# Patient Record
Sex: Female | Born: 1940 | Race: White | Hispanic: No | Smoking: Former smoker
Health system: Southern US, Community
[De-identification: ages and names within clinical notes are randomized; demographics above are authoritative.]

## PROBLEM LIST (undated history)

## (undated) DIAGNOSIS — R0602 Shortness of breath: Secondary | ICD-10-CM

## (undated) DIAGNOSIS — K219 Gastro-esophageal reflux disease without esophagitis: Secondary | ICD-10-CM

## (undated) DIAGNOSIS — F419 Anxiety disorder, unspecified: Secondary | ICD-10-CM

## (undated) DIAGNOSIS — R112 Nausea with vomiting, unspecified: Secondary | ICD-10-CM

## (undated) DIAGNOSIS — I509 Heart failure, unspecified: Secondary | ICD-10-CM

## (undated) DIAGNOSIS — I10 Essential (primary) hypertension: Secondary | ICD-10-CM

## (undated) DIAGNOSIS — J189 Pneumonia, unspecified organism: Secondary | ICD-10-CM

## (undated) DIAGNOSIS — R51 Headache: Secondary | ICD-10-CM

## (undated) DIAGNOSIS — F32A Depression, unspecified: Secondary | ICD-10-CM

## (undated) DIAGNOSIS — M1990 Unspecified osteoarthritis, unspecified site: Secondary | ICD-10-CM

## (undated) DIAGNOSIS — M75101 Unspecified rotator cuff tear or rupture of right shoulder, not specified as traumatic: Secondary | ICD-10-CM

## (undated) DIAGNOSIS — M12811 Other specific arthropathies, not elsewhere classified, right shoulder: Secondary | ICD-10-CM

## (undated) DIAGNOSIS — Z9889 Other specified postprocedural states: Secondary | ICD-10-CM

## (undated) DIAGNOSIS — E785 Hyperlipidemia, unspecified: Secondary | ICD-10-CM

## (undated) DIAGNOSIS — E663 Overweight: Secondary | ICD-10-CM

## (undated) DIAGNOSIS — F329 Major depressive disorder, single episode, unspecified: Secondary | ICD-10-CM

## (undated) HISTORY — DX: Depression, unspecified: F32.A

## (undated) HISTORY — PX: TONSILLECTOMY: SUR1361

## (undated) HISTORY — PX: BELPHAROPTOSIS REPAIR: SHX369

## (undated) HISTORY — PX: COLONOSCOPY: SHX174

## (undated) HISTORY — DX: Essential (primary) hypertension: I10

## (undated) HISTORY — DX: Gastro-esophageal reflux disease without esophagitis: K21.9

## (undated) HISTORY — PX: EYE SURGERY: SHX253

## (undated) HISTORY — PX: APPENDECTOMY: SHX54

## (undated) HISTORY — PX: BREAST BIOPSY: SHX20

## (undated) HISTORY — DX: Heart failure, unspecified: I50.9

## (undated) HISTORY — DX: Overweight: E66.3

## (undated) HISTORY — DX: Hyperlipidemia, unspecified: E78.5

## (undated) HISTORY — DX: Major depressive disorder, single episode, unspecified: F32.9

## (undated) HISTORY — PX: CHOLECYSTECTOMY: SHX55

---

## 1996-12-23 HISTORY — PX: VAGINAL HYSTERECTOMY: SUR661

## 1998-12-03 ENCOUNTER — Encounter: Payer: Self-pay | Admitting: Emergency Medicine

## 1998-12-03 ENCOUNTER — Emergency Department (HOSPITAL_COMMUNITY): Admission: EM | Admit: 1998-12-03 | Discharge: 1998-12-03 | Payer: Self-pay | Admitting: Emergency Medicine

## 1999-05-22 ENCOUNTER — Other Ambulatory Visit: Admission: RE | Admit: 1999-05-22 | Discharge: 1999-05-22 | Payer: Self-pay

## 1999-11-28 ENCOUNTER — Encounter: Payer: Self-pay | Admitting: Obstetrics and Gynecology

## 1999-12-03 ENCOUNTER — Inpatient Hospital Stay (HOSPITAL_COMMUNITY): Admission: RE | Admit: 1999-12-03 | Discharge: 1999-12-06 | Payer: Self-pay | Admitting: Obstetrics and Gynecology

## 1999-12-03 ENCOUNTER — Encounter (INDEPENDENT_AMBULATORY_CARE_PROVIDER_SITE_OTHER): Payer: Self-pay

## 2001-04-22 ENCOUNTER — Other Ambulatory Visit: Admission: RE | Admit: 2001-04-22 | Discharge: 2001-04-22 | Payer: Self-pay | Admitting: Obstetrics and Gynecology

## 2001-10-22 ENCOUNTER — Encounter: Payer: Self-pay | Admitting: Obstetrics and Gynecology

## 2001-10-22 ENCOUNTER — Ambulatory Visit (HOSPITAL_COMMUNITY): Admission: RE | Admit: 2001-10-22 | Discharge: 2001-10-22 | Payer: Self-pay | Admitting: Obstetrics and Gynecology

## 2001-12-23 HISTORY — PX: ANTERIOR AND POSTERIOR REPAIR: SHX1172

## 2002-01-28 ENCOUNTER — Ambulatory Visit (HOSPITAL_COMMUNITY): Admission: RE | Admit: 2002-01-28 | Discharge: 2002-01-28 | Payer: Self-pay | Admitting: Orthopaedic Surgery

## 2002-01-28 ENCOUNTER — Encounter: Payer: Self-pay | Admitting: Orthopaedic Surgery

## 2002-07-08 ENCOUNTER — Other Ambulatory Visit: Admission: RE | Admit: 2002-07-08 | Discharge: 2002-07-08 | Payer: Self-pay | Admitting: *Deleted

## 2002-10-22 ENCOUNTER — Ambulatory Visit (HOSPITAL_COMMUNITY): Admission: RE | Admit: 2002-10-22 | Discharge: 2002-10-22 | Payer: Self-pay | Admitting: *Deleted

## 2003-05-20 ENCOUNTER — Ambulatory Visit (HOSPITAL_COMMUNITY): Admission: RE | Admit: 2003-05-20 | Discharge: 2003-05-20 | Payer: Self-pay | Admitting: Gastroenterology

## 2003-12-14 ENCOUNTER — Encounter: Admission: RE | Admit: 2003-12-14 | Discharge: 2004-03-13 | Payer: Self-pay | Admitting: Family Medicine

## 2004-03-14 ENCOUNTER — Encounter: Admission: RE | Admit: 2004-03-14 | Discharge: 2004-06-12 | Payer: Self-pay | Admitting: Family Medicine

## 2004-03-20 ENCOUNTER — Ambulatory Visit (HOSPITAL_COMMUNITY): Admission: RE | Admit: 2004-03-20 | Discharge: 2004-03-20 | Payer: Self-pay | Admitting: *Deleted

## 2004-11-23 ENCOUNTER — Ambulatory Visit (HOSPITAL_COMMUNITY): Admission: RE | Admit: 2004-11-23 | Discharge: 2004-11-23 | Payer: Self-pay | Admitting: *Deleted

## 2004-11-23 ENCOUNTER — Encounter: Payer: Self-pay | Admitting: Family Medicine

## 2004-12-21 ENCOUNTER — Ambulatory Visit (HOSPITAL_COMMUNITY): Admission: RE | Admit: 2004-12-21 | Discharge: 2004-12-21 | Payer: Self-pay | Admitting: Orthopaedic Surgery

## 2005-03-13 ENCOUNTER — Ambulatory Visit (HOSPITAL_COMMUNITY): Admission: RE | Admit: 2005-03-13 | Discharge: 2005-03-13 | Payer: Self-pay | Admitting: *Deleted

## 2005-03-20 ENCOUNTER — Encounter: Admission: RE | Admit: 2005-03-20 | Discharge: 2005-03-20 | Payer: Self-pay | Admitting: *Deleted

## 2006-01-30 ENCOUNTER — Ambulatory Visit: Payer: Self-pay | Admitting: *Deleted

## 2006-01-30 ENCOUNTER — Ambulatory Visit (HOSPITAL_COMMUNITY): Admission: RE | Admit: 2006-01-30 | Discharge: 2006-01-30 | Payer: Self-pay | Admitting: *Deleted

## 2006-02-03 ENCOUNTER — Ambulatory Visit: Payer: Self-pay

## 2006-02-10 ENCOUNTER — Ambulatory Visit: Payer: Self-pay | Admitting: *Deleted

## 2006-02-12 ENCOUNTER — Ambulatory Visit (HOSPITAL_COMMUNITY): Admission: RE | Admit: 2006-02-12 | Discharge: 2006-02-12 | Payer: Self-pay | Admitting: *Deleted

## 2006-02-19 ENCOUNTER — Ambulatory Visit: Payer: Self-pay | Admitting: *Deleted

## 2006-02-19 ENCOUNTER — Inpatient Hospital Stay (HOSPITAL_BASED_OUTPATIENT_CLINIC_OR_DEPARTMENT_OTHER): Admission: RE | Admit: 2006-02-19 | Discharge: 2006-02-19 | Payer: Self-pay | Admitting: *Deleted

## 2006-02-25 ENCOUNTER — Ambulatory Visit: Payer: Self-pay

## 2006-03-03 ENCOUNTER — Ambulatory Visit: Payer: Self-pay | Admitting: *Deleted

## 2006-04-16 ENCOUNTER — Ambulatory Visit: Payer: Self-pay | Admitting: Internal Medicine

## 2006-04-16 ENCOUNTER — Ambulatory Visit (HOSPITAL_COMMUNITY): Admission: RE | Admit: 2006-04-16 | Discharge: 2006-04-16 | Payer: Self-pay | Admitting: *Deleted

## 2006-04-23 ENCOUNTER — Ambulatory Visit: Payer: Self-pay | Admitting: Internal Medicine

## 2006-05-02 ENCOUNTER — Encounter: Admission: RE | Admit: 2006-05-02 | Discharge: 2006-05-02 | Payer: Self-pay | Admitting: *Deleted

## 2006-07-29 ENCOUNTER — Ambulatory Visit: Payer: Self-pay | Admitting: *Deleted

## 2006-07-31 ENCOUNTER — Ambulatory Visit: Payer: Self-pay | Admitting: Cardiology

## 2006-07-31 ENCOUNTER — Ambulatory Visit (HOSPITAL_COMMUNITY): Admission: RE | Admit: 2006-07-31 | Discharge: 2006-07-31 | Payer: Self-pay | Admitting: *Deleted

## 2006-12-19 ENCOUNTER — Ambulatory Visit: Payer: Self-pay | Admitting: Internal Medicine

## 2007-01-08 ENCOUNTER — Ambulatory Visit: Payer: Self-pay | Admitting: Pulmonary Disease

## 2007-01-26 ENCOUNTER — Ambulatory Visit: Payer: Self-pay | Admitting: Internal Medicine

## 2007-05-08 ENCOUNTER — Encounter: Admission: RE | Admit: 2007-05-08 | Discharge: 2007-05-08 | Payer: Self-pay | Admitting: Obstetrics and Gynecology

## 2007-05-27 ENCOUNTER — Ambulatory Visit: Payer: Self-pay | Admitting: Internal Medicine

## 2007-06-23 ENCOUNTER — Ambulatory Visit: Payer: Self-pay

## 2007-06-23 ENCOUNTER — Encounter: Payer: Self-pay | Admitting: Internal Medicine

## 2007-09-24 ENCOUNTER — Ambulatory Visit: Payer: Self-pay | Admitting: Internal Medicine

## 2008-01-27 ENCOUNTER — Ambulatory Visit: Payer: Self-pay | Admitting: Internal Medicine

## 2008-05-12 ENCOUNTER — Encounter: Admission: RE | Admit: 2008-05-12 | Discharge: 2008-05-12 | Payer: Self-pay | Admitting: Obstetrics and Gynecology

## 2008-07-25 ENCOUNTER — Ambulatory Visit: Payer: Self-pay

## 2008-07-25 ENCOUNTER — Encounter: Payer: Self-pay | Admitting: Internal Medicine

## 2008-08-03 ENCOUNTER — Encounter: Payer: Self-pay | Admitting: Family Medicine

## 2008-08-23 ENCOUNTER — Ambulatory Visit: Payer: Self-pay | Admitting: Internal Medicine

## 2009-02-22 ENCOUNTER — Encounter (INDEPENDENT_AMBULATORY_CARE_PROVIDER_SITE_OTHER): Payer: Self-pay | Admitting: *Deleted

## 2009-02-23 ENCOUNTER — Ambulatory Visit: Payer: Self-pay | Admitting: Internal Medicine

## 2009-02-23 ENCOUNTER — Encounter: Payer: Self-pay | Admitting: Internal Medicine

## 2009-02-23 DIAGNOSIS — G909 Disorder of the autonomic nervous system, unspecified: Secondary | ICD-10-CM | POA: Insufficient documentation

## 2009-02-23 DIAGNOSIS — E785 Hyperlipidemia, unspecified: Secondary | ICD-10-CM | POA: Insufficient documentation

## 2009-02-23 DIAGNOSIS — I428 Other cardiomyopathies: Secondary | ICD-10-CM

## 2009-03-09 ENCOUNTER — Ambulatory Visit: Payer: Self-pay | Admitting: Internal Medicine

## 2009-03-09 LAB — CONVERTED CEMR LAB
CO2: 29 meq/L (ref 19–32)
Calcium: 8.6 mg/dL (ref 8.4–10.5)
Chloride: 102 meq/L (ref 96–112)
GFR calc non Af Amer: 88.92 mL/min (ref >60)
Glucose, Bld: 112 mg/dL — ABNORMAL HIGH (ref 70–99)
HDL: 58.9 mg/dL (ref >39.00)
Total CHOL/HDL Ratio: 3
Total Protein: 6.3 g/dL (ref 6.0–8.3)
VLDL: 14.4 mg/dL (ref 0.0–40.0)

## 2009-05-16 ENCOUNTER — Encounter: Admission: RE | Admit: 2009-05-16 | Discharge: 2009-05-16 | Payer: Self-pay | Admitting: Obstetrics and Gynecology

## 2009-05-16 ENCOUNTER — Ambulatory Visit: Payer: Self-pay | Admitting: Family Medicine

## 2009-06-22 ENCOUNTER — Ambulatory Visit: Payer: Self-pay | Admitting: Family Medicine

## 2009-06-22 DIAGNOSIS — K5900 Constipation, unspecified: Secondary | ICD-10-CM | POA: Insufficient documentation

## 2009-06-23 LAB — CONVERTED CEMR LAB: TSH: 2.64 microintl units/mL (ref 0.35–5.50)

## 2009-08-04 DIAGNOSIS — Z8659 Personal history of other mental and behavioral disorders: Secondary | ICD-10-CM | POA: Insufficient documentation

## 2009-08-04 DIAGNOSIS — G4700 Insomnia, unspecified: Secondary | ICD-10-CM | POA: Insufficient documentation

## 2010-01-03 ENCOUNTER — Ambulatory Visit: Payer: Self-pay | Admitting: Family Medicine

## 2010-01-08 ENCOUNTER — Ambulatory Visit: Payer: Self-pay | Admitting: Family Medicine

## 2010-02-22 ENCOUNTER — Ambulatory Visit: Payer: Self-pay | Admitting: Internal Medicine

## 2010-02-26 ENCOUNTER — Ambulatory Visit: Payer: Self-pay | Admitting: Internal Medicine

## 2010-02-26 DIAGNOSIS — I5043 Acute on chronic combined systolic (congestive) and diastolic (congestive) heart failure: Secondary | ICD-10-CM

## 2010-02-28 LAB — CONVERTED CEMR LAB
ALT: 24 units/L (ref 0–35)
Albumin: 3.6 g/dL (ref 3.5–5.2)
BUN: 15 mg/dL (ref 6–23)
Basophils Relative: 1.3 % (ref 0.0–3.0)
CO2: 32 meq/L (ref 19–32)
Creatinine, Ser: 0.8 mg/dL (ref 0.4–1.2)
Eosinophils Relative: 6.9 % — ABNORMAL HIGH (ref 0.0–5.0)
Glucose, Bld: 109 mg/dL — ABNORMAL HIGH (ref 70–99)
HCT: 42.3 % (ref 36.0–46.0)
MCHC: 32.8 g/dL (ref 30.0–36.0)
MCV: 93.2 fL (ref 78.0–100.0)
Potassium: 4.2 meq/L (ref 3.5–5.1)
RBC: 4.54 M/uL (ref 3.87–5.11)
RDW: 13.6 % (ref 11.5–14.6)
Sodium: 141 meq/L (ref 135–145)
TSH: 2.47 microintl units/mL (ref 0.35–5.50)
Total Bilirubin: 0.6 mg/dL (ref 0.3–1.2)
Total Protein: 6.9 g/dL (ref 6.0–8.3)
Triglycerides: 94 mg/dL (ref 0.0–149.0)
VLDL: 18.8 mg/dL (ref 0.0–40.0)
WBC: 6.8 10*3/uL (ref 4.5–10.5)

## 2010-06-21 ENCOUNTER — Ambulatory Visit: Payer: Self-pay | Admitting: Family Medicine

## 2010-06-21 DIAGNOSIS — R519 Headache, unspecified: Secondary | ICD-10-CM | POA: Insufficient documentation

## 2010-06-21 DIAGNOSIS — R51 Headache: Secondary | ICD-10-CM

## 2010-09-04 ENCOUNTER — Telehealth: Payer: Self-pay | Admitting: Family Medicine

## 2010-12-03 ENCOUNTER — Telehealth (INDEPENDENT_AMBULATORY_CARE_PROVIDER_SITE_OTHER): Payer: Self-pay | Admitting: *Deleted

## 2011-01-15 ENCOUNTER — Encounter: Payer: Self-pay | Admitting: Internal Medicine

## 2011-01-15 ENCOUNTER — Ambulatory Visit
Admission: RE | Admit: 2011-01-15 | Discharge: 2011-01-15 | Payer: Self-pay | Source: Home / Self Care | Attending: Internal Medicine | Admitting: Internal Medicine

## 2011-01-22 NOTE — Progress Notes (Signed)
Summary: Hydrodocone refill request  Phone Note From Pharmacy   [facility]: [facility]  [phone]* Call For: [place]  Summary of Call: Pharmacy faxed request to fill Hydrocodone 5-325mg , last refill #30 with 0 refills on 05/25/2010 Initial call taken by: Sid Falcon LPN,  September 04, 2010 12:04 PM  Follow-up for Phone Call        may refill once. Follow-up by: Evelena Peat MD,  September 04, 2010 2:24 PM    Prescriptions: HYDROCODONE-ACETAMINOPHEN 5-325 MG TABS (HYDROCODONE-ACETAMINOPHEN) 1-2 by mouth q 4-6 hours as needed headache  #30 x 0   Entered by:   Lynann Beaver CMA   Authorized by:   Evelena Peat MD   Signed by:   Lynann Beaver CMA on 09/04/2010   Method used:   Telephoned to ...       [facility]  [phone]* (retail)       [address] [place], [place]  [address]       Ph: [phone] or [phone]       Fax: [phone]   RxID:   [id number]

## 2011-01-22 NOTE — Assessment & Plan Note (Signed)
Summary: f1y  Medications Added TRAVATAN Z 0.004 % SOLN (TRAVOPROST) 1 drop as needed ISTALOL 0.5 % SOLN (TIMOLOL MALEATE) 1 drop in the am      Allergies Added:   Visit Type:  Follow-up Primary Provider:  Evelena Peat MD  CC:  no complaints.  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 Most recent ejection fraction in 8/09 was 55-60%. She returns for routine followup.  She is doing very well. Started back in walking program and is bowling once or twice a week. She is not havin any chest pain or dyspnea. She is furstrated that she has gained 5 pounds. She has been compliant with all her medications.  Occasional mild edema. Controlled with HCTZ. No dizzinees or orthostasis.  Current Medications (verified): 1)  Lisinopril 5 Mg Tabs (Lisinopril) .[name].. 1 Tablet By Mouth Daily 2)  Aspirin 81 Mg  Tbec (Aspirin) .... One By Mouth Every Day 3)  Hydrochlorothiazide 12.5 Mg  Tabs (Hydrochlorothiazide) .... Take 1 Tab  By Mouth Every Morning 4)  Omeprazole 20 Mg Cpdr (Omeprazole) .... One By Mouth Daily 5)  Fluoxetine Hcl 20 Mg  Tabs (Fluoxetine Hcl) .... Take 1 Tab By Mouth Daily 6)  Metoprolol Succinate 50 Mg Xr24h-Tab (Metoprolol Succinate) .... Take One Tablet By Mouth Daily 7)  Lipitor 40 Mg Tabs (Atorvastatin Calcium) .... Take One Tablet By Mouth Daily. 8)  Fish Oil   Oil (Fish Oil) .... 1200mg  Four Times A Day 9)  Multivitamins   Tabs (Multiple Vitamin) .... Once Daily 10)  Calcium Carbonate-Vitamin D 600-400 Mg-Unit  Tabs (Calcium Carbonate-Vitamin D) .... Two Times A Day 11)  Alprazolam 0.5 Mg Tabs (Alprazolam) .... As Needed 12)  Travatan Z 0.004 % Soln (Travoprost) .[name].. 1 Drop As Needed 13)  Istalol 0.5 % Soln (Timolol Maleate) .[name].. 1 Drop in The Am  Allergies (verified): 1)  ! Codeine 2)  ! Erythromycin  Past History:  Past Medical History: Last updated: 08/04/2009 1. CHF due  to nonischemic cardiomyopathy, resolved     a.   February 19, 2006 cardiac catheterization revealing normal            coronary arteries with an EF of 50%. EF 35-40% by ECHO.      b.   Echo 2008: EF 40-50% with mild diffuse            LV hypokinesis and abnormal left ventricular relaxation.  Mild AI.      c.   Echo 8/09 EF 55-60%. Trivial AI     d.   CPX 4/07, pVO2: 20.6 (105% predicted) slope of 42.7.  pRER 1.09.            There was a mild restriction on her spirometry. 2. Hyperlipidemia 3. Hypertension 4. Overweight 5. Gastroesophageal reflux disease Depression  Review of Systems       As per HPI and past medical history; otherwise all systems negative.   Vital Signs:  Patient profile:   70 year old female Menstrual status:  hysterectomy Height:      65.25 inches Weight:      184 pounds BMI:     30.49 Pulse rate:   69 / minute BP sitting:   94 / 68  (left arm) Cuff size:   regular  Vitals Entered By: Hardin Negus, RMA (February 22, 2010 1:55 PM)  Physical Exam  General:  Gen: well appearing. no resp difficulty  HEENT: normal Neck: supple. no JVD. Carotids 2+ bilat; no bruits. No lymphadenopathy or thryomegaly appreciated. Cor: PMI nondisplaced. Regular rate & rhythm. No rubs, gallops, murmur. Lungs: clear Abdomen: soft, nontender, nondistended. No hepatosplenomegaly. No bruits or masses. Good bowel sounds. Extremities: no cyanosis, clubbing, rash, edema Neuro: alert & orientedx3, cranial nerves grossly intact. moves all 4 extremities w/o difficulty. affect pleasant    Impression & Recommendations:  Problem # 1:  CARDIOMYOPATHY, PRIMARY, DILATED (ICD-425.4) EF normalized. Doing very well. Will continue current medications. Told er she can use the HCTZ on as needed basis just to control edema.   Problem # 2:  HYPERLIPIDEMIA-MIXED (ICD-272.4) Check lipids today. Continue statin. F/u with Dr. Caryl Never.   Her updated medication list for this problem includes:     Lipitor 40 Mg Tabs (Atorvastatin calcium) .[name]... Take one tablet by mouth daily.  Other Orders: EKG w/ Interpretation (93000)  Patient Instructions: 1)  Your physician recommends that you return for a FASTING lipid, liver, bmet, cbc, and tsh profile: 425.4, 272.0 2)  Follow up in 1 year Prescriptions: LIPITOR 40 MG TABS (ATORVASTATIN CALCIUM) Take one tablet by mouth daily.  #90 x 3   Entered by:   Meredith Staggers, RN   Authorized by:   Dolores Patty, MD, [facility]   Signed by:   Meredith Staggers, RN on 02/22/2010   Method used:   Electronically to        [facility]  [phone]* (retail)       [address] [place], [place]  [address]       Ph: [phone] or [phone]       Fax: [phone]   RxID:   [id number] METOPROLOL SUCCINATE 50 MG XR24H-TAB (METOPROLOL SUCCINATE) Take one tablet by mouth daily  #90 x 3   Entered by:   Meredith Staggers, RN   Authorized by:   Dolores Patty, MD, [facility]   Signed by:   Meredith Staggers, RN on 02/22/2010   Method used:   Electronically to        [facility]  [phone]* (retail)       [address] [place], [place]  [address]       Ph: [phone] or [phone]       Fax: [phone]   RxID:   [id number] HYDROCHLOROTHIAZIDE 12.5 MG  TABS (HYDROCHLOROTHIAZIDE) Take 1 tab  by mouth every morning  #90 x 3   Entered by:   Meredith Staggers, RN   Authorized by:   Dolores Patty, MD, [facility]   Signed by:   Meredith Staggers, RN on 02/22/2010   Method used:   Electronically to        [facility]  [phone]* (retail)       [address] [place], [place]  [address]       Ph: [phone] or [phone]       Fax: [phone]   RxID:   [id number] LISINOPRIL 5 MG TABS (LISINOPRIL) 1 tablet by mouth daily  #90 x 3   Entered by:   Meredith Staggers, RN   Authorized by:   Dolores Patty, MD, [facility]   Signed by:   Meredith Staggers, RN on 02/22/2010   Method used:   Electronically to        [facility]  [phone]*  (retail)       [address]  [place], [place]  [address]       Ph: [phone] or [phone]       Fax: [phone]   RxID:   [id number]

## 2011-01-22 NOTE — Assessment & Plan Note (Signed)
Summary: ?uri/njr   Vital Signs:  Patient profile:   70 year old female Menstrual status:  hysterectomy Temp:     98.2 degrees F oral BP sitting:   90 / 60  (left arm) Cuff size:   regular  Vitals Entered By: Sid Falcon LPN (January 08, 2010 2:49 PM) CC: Ongoing cough, back pain   History of Present Illness: Persistent coughing though somewhat improved. Has some mild right thoracic back pain which she attributed to coughing. No fever. Treated with azithromycin and hydrocodone cough syrup and these have helped somewhat. She has some occasional mild wheezing. No hemoptysis. Denies any pleuritic pain or dyspnea.  Allergies: 1)  ! Codeine 2)  ! Erythromycin  Past History:  Past Medical History: Last updated: 08/04/2009 1. 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   February 19, 2006 cardiac catheterization revealing normal            coronary arteries with an EF of 50%. EF 35-40% by ECHO.      b.   Echo 2008: EF 40-50% with mild diffuse            LV hypokinesis and abnormal left ventricular relaxation.  Mild AI.      c.   Echo 8/09 EF 55-60%. Trivial AI     d.   CPX 4/07, pVO2: 20.6 (105% predicted) slope of 42.7.  pRER 1.09.            There was a mild restriction on her spirometry. 2. Hyperlipidemia 3. Hypertension 4. Overweight 5. Gastroesophageal reflux disease Depression  Past Surgical History: Last updated: 05/16/2009 1. cholecystectomy 2008 2. 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 History of smoking 2 packs per day for many years, quit 1984 Retired Divorced  Review of Systems      See HPI  Physical Exam  General:  Well-developed,well-nourished,in no acute distress; alert,appropriate and cooperative throughout examination Ears:  External ear exam shows no significant lesions or deformities.  Otoscopic examination reveals clear canals, tympanic membranes are intact bilaterally  without bulging, retraction, inflammation or discharge. Hearing is grossly normal bilaterally. Nose:  External nasal examination shows no deformity or inflammation. Nasal mucosa are pink and moist without lesions or exudates. Mouth:  Oral mucosa and oropharynx without lesions or exudates.  Teeth in good repair. Neck:  No deformities, masses, or tenderness noted. Lungs:  patient has a few very faint wheezes but no rales and no retractions.normal respiratory effort and no accessory muscle use.  normal respiratory effort and no accessory muscle use.   Heart:  Normal rate and regular rhythm. S1 and S2 normal without gallop, murmur, click, rub or other extra sounds.   Impression & Recommendations:  Problem # 1:  BRONCHITIS, ACUTE WITH MILD BRONCHOSPASM (ICD-466.0)  no additional antibiotics indicated at this time. Depo-Medrol 80 mg IM given for reactive airway component. The following medications were removed from the medication list:    Azithromycin 250 Mg Tabs (Azithromycin) .[name]... 2 by mouth today then one by mouth once daily for 4 days Her updated medication list for this problem includes:    Hydrocodone-homatropine 5-1.5 Mg/80ml Syrp (Hydrocodone-homatropine) ..... One tsp by mouth q 4-6 hours as needed cough  Orders: Depo- Medrol 80mg  (J1040)  Complete Medication List: 1)  Lisinopril 5 Mg Tabs (Lisinopril) .[name].. 1 tablet by mouth daily 2)  Aspirin 81 Mg Tbec (Aspirin) .... One by mouth every day 3)  Hydrochlorothiazide 12.5 Mg Tabs (Hydrochlorothiazide) .... Take 1 tab  by mouth every morning 4)  Omeprazole 20 Mg Cpdr (Omeprazole) .... One by mouth daily 5)  Fluoxetine Hcl 20 Mg Tabs (Fluoxetine hcl) .... Take 1 tab by mouth daily 6)  Metoprolol Succinate 50 Mg Xr24h-tab (Metoprolol succinate) .... Take one tablet by mouth daily 7)  Lipitor 40 Mg Tabs (Atorvastatin calcium) .... Take one tablet by mouth daily. 8)  Fish Oil Oil (Fish oil) .... 1200mg  four times a day 9)  Multivitamins Tabs  (Multiple vitamin) .... Once daily 10)  Calcium Carbonate-vitamin D 600-400 Mg-unit Tabs (Calcium carbonate-vitamin d) .... Two times a day 11)  Alprazolam 0.5 Mg Tabs (Alprazolam) .... As needed 12)  Hydrocodone-homatropine 5-1.5 Mg/11ml Syrp (Hydrocodone-homatropine) .... One tsp by mouth q 4-6 hours as needed cough  Patient Instructions: 1)  Call or be in touch within one week if cough is not further improved.

## 2011-01-22 NOTE — Assessment & Plan Note (Signed)
Summary: SEVERE HEADACHES/BLOOD SHOT EYES/NS   Vital Signs:  Patient profile:   70 year old female Menstrual status:  hysterectomy Temp:     98 degrees F oral BP sitting:   100 / 70  (left arm) Cuff size:   regular  Vitals Entered By: Sid Falcon LPN (June 21, 2010 2:49 PM) CC: Severe headaches, Headaches, Headache   History of Present Illness: Here for severe headache. 4 day duration. Skelaxin and Vicodin helps but doesn't relieve. 5/10 pain Reported hx of migraines in past and she states this is similar though not quite as severe.  Similar headaces in past with changes in barometric pressure.  Headaches      This is a 70 year old woman who presents with Headaches.  The patient complains of nausea and photophobia, but denies vomiting, nasal congestion, and sinus pain.  The headache is described as constant and throbbing.  The location of the pain is bitemporal.  The patient denies the following high-risk features: fever, neck pain/stiffness, vision loss or change, focal weakness, altered mental status, rash, trauma, and pain worse with exertion.  The headaches are precipitated by change in weather.  Prior treatment has included a NSAID.  She tried only one-half of a vicodin which again helped but didn't get rid of HA completely.  Hx nonischemic cardiomyopathy. No recent dyspnea or orthopnea.  No edema.  Headache HPI:      The location of the headaches are bitemporal.  Headache quality is constant and throbbing.        Allergies: 1)  Codeine 2)  Erythromycin  Past History:  Past Medical History: Last updated: 08/04/2009 1. 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   February 19, 2006 cardiac catheterization revealing normal            coronary arteries with an EF of 50%. EF 35-40% by ECHO.      b.   Echo 2008: EF 40-50% with mild diffuse            LV hypokinesis and abnormal left ventricular relaxation.  Mild AI.      c.   Echo 8/09 EF 55-60%. Trivial AI  d.   CPX 4/07, pVO2: 20.6 (105% predicted) slope of 42.7.  pRER 1.09.            There was a mild restriction on her spirometry. 2. Hyperlipidemia 3. Hypertension 4. Overweight 5. Gastroesophageal reflux disease Depression  Family History: Last updated: 05/16/2009 Remarkable for her father having had heart disease. Otherwise, noncontributory. 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    Physical Exam  General:  Well-developed,well-nourished,in no acute distress; alert,appropriate and cooperative throughout examination Head:  Normocephalic and atraumatic without obvious abnormalities. No apparent alopecia or balding.  No temporal artery tenderness. Eyes:  pupils equal, pupils round, and pupils reactive to light.   Ears:  External ear exam shows no significant lesions or deformities.  Otoscopic examination reveals clear canals, tympanic membranes are intact bilaterally without bulging, retraction, inflammation or discharge. Hearing is grossly normal bilaterally. Mouth:  Oral mucosa and oropharynx without lesions or  exudates.  Teeth in good repair. Neck:  No deformities, masses, or tenderness noted. Neck is supple. Lungs:  Normal respiratory effort, chest expands symmetrically. Lungs are clear to auscultation, no crackles or wheezes. Heart:  Normal rate and regular rhythm. S1 and S2 normal without gallop, murmur, click, rub or other extra sounds. Neurologic:  alert & oriented X3, cranial nerves II-XII intact, strength normal in all extremities, and gait normal.   Skin:  no rashes.     Cervical Nodes:  No lymphadenopathy noted Psych:  normally interactive, good eye contact, not anxious appearing, and not depressed appearing.     Impression & Recommendations:  Problem # 1:  HEADACHE (ICD-784.0) Assessment New  ?mixed type.  Nonfocal neuro exam.  Vicodin prescribed as NSAIDs have not helped. Her updated medication list for this problem includes:    Aspirin 81 Mg Tbec (Aspirin) ..... One by mouth every day    Metoprolol Succinate 50 Mg Xr24h-tab (Metoprolol succinate) .[name]... Take one tablet by mouth daily    Hydrocodone-acetaminophen 5-325 Mg Tabs (Hydrocodone-acetaminophen) .[name]... 1-2 by mouth q 4-6 hours as needed headache  Orders: Prescription Created Electronically [phone])  Problem # 2:  CARDIOMYOPATHY, PRIMARY, DILATED (ICD-425.4) Assessment: Unchanged  Complete Medication List: 1)  Lisinopril 5 Mg Tabs (Lisinopril) .[name].. 1 tablet by mouth daily 2)  Aspirin 81 Mg Tbec (Aspirin) .... One by mouth every day 3)  Hydrochlorothiazide 12.5 Mg Tabs (Hydrochlorothiazide) .... Take 1 tab  by mouth every morning 4)  Omeprazole 20 Mg Cpdr (Omeprazole) .... One by mouth daily 5)  Fluoxetine Hcl 20 Mg Tabs (Fluoxetine hcl) .... Take 1 tab by mouth daily 6)  Metoprolol Succinate 50 Mg Xr24h-tab (Metoprolol succinate) .... Take one tablet by mouth daily 7)  Lipitor 40 Mg Tabs (Atorvastatin calcium) .... Take one tablet by mouth daily. 8)  Fish Oil Oil (Fish oil) .... 1200mg  four times a day 9)  Multivitamins Tabs (Multiple vitamin) .... Once daily 10)  Calcium Carbonate-vitamin D 600-400 Mg-unit Tabs (Calcium carbonate-vitamin d) .... Two times a day 11)  Alprazolam 0.5 Mg Tabs (Alprazolam) .... As needed 12)  Travatan Z 0.004 % Soln (Travoprost) .[name].. 1 drop left eye at hs 13)  Istalol 0.5 % Soln (Timolol maleate) .[name].. 1 drop in the am 14)  Hydrocodone-acetaminophen 5-325 Mg Tabs (Hydrocodone-acetaminophen) .[name].. 1-2 by mouth q 4-6 hours as needed headache  Patient  Instructions: 1)  Be in touch if headache not improving over the next day. Prescriptions: HYDROCODONE-ACETAMINOPHEN 5-325 MG TABS (HYDROCODONE-ACETAMINOPHEN) 1-2 by mouth q 4-6 hours as needed headache  #30 x 0   Entered and Authorized by:   Evelena Peat MD   Signed by:   Evelena Peat MD on 06/21/2010   Method used:   Print then Give to Patient   RxID:   [id number]

## 2011-01-22 NOTE — Assessment & Plan Note (Signed)
Summary: ?URI/NJR   Vital Signs:  Patient profile:   70 year old female Menstrual status:  hysterectomy Temp:     98.1 degrees F oral BP sitting:   90 / 62  (left arm) Cuff size:   regular  Vitals Entered By: Sid Falcon LPN (January 03, 2010 2:25 PM) CC: Upprt resp symptoms X 5-6 days   History of Present Illness: Acute visit. Upper respiratory symptoms past 5-6 days. Nasal congestion and dry cough. Possibly low-grade fever and night sweats. Mild sore throat. Mucinex DM without relief. Allergy to codeine but has taken hydrocodone in past. Nonsmoker. Denies hemoptysis or any pleuritic pain.  Allergies: 1)  ! Codeine 2)  ! Erythromycin  Past History:  Past Medical History: Last updated: 08/04/2009 1. 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   February 19, 2006 cardiac catheterization revealing normal            coronary arteries with an EF of 50%. EF 35-40% by ECHO.      b.   Echo 2008: EF 40-50% with mild diffuse            LV hypokinesis and abnormal left ventricular relaxation.  Mild AI.      c.   Echo 8/09 EF 55-60%. Trivial AI     d.   CPX 4/07, pVO2: 20.6 (105% predicted) slope of 42.7.  pRER 1.09.            There was a mild restriction on her spirometry. 2. Hyperlipidemia 3. Hypertension 4. Overweight 5. Gastroesophageal reflux disease Depression  Past Surgical History: Last updated: 05/16/2009 1. cholecystectomy 2008 2. Hyterectomy 1998  Review of Systems      See HPI  Physical Exam  General:  Well-developed,well-nourished,in no acute distress; alert,appropriate and cooperative throughout examination Ears:  External ear exam shows no significant lesions or deformities.  Otoscopic examination reveals clear canals, tympanic membranes are intact bilaterally without bulging, retraction, inflammation or discharge. Hearing is grossly normal bilaterally. Nose:  External nasal examination shows no deformity or inflammation. Nasal mucosa are pink and  moist without lesions or exudates. Mouth:  Oral mucosa and oropharynx without lesions or exudates.  Teeth in good repair. Neck:  No deformities, masses, or tenderness noted. Lungs:  chest some faint wheezes. Very faint crackles left base cleared slightly with deep breathing. Good breath sounds. No retractions Heart:  Normal rate and regular rhythm. S1 and S2 normal without gallop, murmur, click, rub or other extra sounds.   Impression & Recommendations:  Problem # 1:  BRONCHITIS, ACUTE WITH MILD BRONCHOSPASM (ICD-466.0)  Her updated medication list for this problem includes:    Azithromycin 250 Mg Tabs (Azithromycin) .[name]... 2 by mouth today then one by mouth once daily for 4 days    Hydrocodone-homatropine 5-1.5 Mg/20ml Syrp (Hydrocodone-homatropine) ..... One tsp by mouth q 4-6 hours as needed cough  Complete Medication List: 1)  Lisinopril 5 Mg Tabs (Lisinopril) .[name].. 1 tablet by mouth daily 2)  Aspirin 81 Mg Tbec (Aspirin) .... One by mouth every day 3)  Hydrochlorothiazide 12.5 Mg Tabs (Hydrochlorothiazide) .... Take 1 tab  by mouth every morning 4)  Omeprazole 20 Mg Cpdr (Omeprazole) .... One by mouth daily 5)  Fluoxetine Hcl 20 Mg Tabs (Fluoxetine hcl) .... Take 1 tab by mouth daily 6)  Metoprolol Succinate 50 Mg Xr24h-tab (Metoprolol succinate) .... Take one tablet by mouth daily 7)  Lipitor 40 Mg Tabs (Atorvastatin calcium) .... Take one tablet by mouth daily.  8)  Fish Oil Oil (Fish oil) .... 1200mg  four times a day 9)  Multivitamins Tabs (Multiple vitamin) .... Once daily 10)  Calcium Carbonate-vitamin D 600-400 Mg-unit Tabs (Calcium carbonate-vitamin d) .... Two times a day 11)  Alprazolam 0.5 Mg Tabs (Alprazolam) .... As needed 12)  Azithromycin 250 Mg Tabs (Azithromycin) .... 2 by mouth today then one by mouth once daily for 4 days 13)  Hydrocodone-homatropine 5-1.5 Mg/40ml Syrp (Hydrocodone-homatropine) .... One tsp by mouth q 4-6 hours as needed cough  Patient  Instructions: 1)  Acute Bronchitis symptoms for less then 10 days are not  helped by antibiotics. Take over the counter cough medications. Call if no improvement in 5-7 days, sooner if increasing cough, fever, or new symptoms ( shortness of breath, chest pain) .  Prescriptions: HYDROCODONE-HOMATROPINE 5-1.5 MG/5ML SYRP (HYDROCODONE-HOMATROPINE) one tsp by mouth q 4-6 hours as needed cough  #120 ml x 0   Entered and Authorized by:   Evelena Peat MD   Signed by:   Evelena Peat MD on 01/03/2010   Method used:   Print then Give to Patient   RxID:   [id number] AZITHROMYCIN 250 MG TABS (AZITHROMYCIN) 2 by mouth today then one by mouth once daily for 4 days  #6 x 0   Entered and Authorized by:   Evelena Peat MD   Signed by:   Evelena Peat MD on 01/03/2010   Method used:   [facility]ronically to        [facility]  [phone]* (retail)       [address] [place], [place]  [address]       Ph: [phone] or [phone]       Fax: [phone]   RxID:   [id number]

## 2011-01-24 NOTE — Assessment & Plan Note (Signed)
   Vital Signs:  Patient profile:   70 year old female Menstrual status:  hysterectomy Weight:      182 pounds BMI:     30.16 Temp:     97.8 degrees F oral Pulse rate:   64 / minute Pulse rhythm:   regular BP sitting:   100 / 62  (left arm) Cuff size:   regular  Vitals Entered By: Kyung Rudd, CMA (January 15, 2011 3:39 PM) CC: pt c/o ??sinus inf x 1 week   CC:  pt c/o ??sinus inf x 1 week.  Current Medications (verified): 1)  Lisinopril 5 Mg Tabs (Lisinopril) .[name].. 1 Tablet By Mouth Daily 2)  Aspirin 81 Mg  Tbec (Aspirin) .... One By Mouth Every Day 3)  Hydrochlorothiazide 12.5 Mg  Tabs (Hydrochlorothiazide) .... Take 1 Tab  By Mouth Every Morning 4)  Omeprazole 20 Mg Cpdr (Omeprazole) .... One By Mouth Daily 5)  Fluoxetine Hcl 20 Mg  Tabs (Fluoxetine Hcl) .... Take 1 Tab By Mouth Daily 6)  Metoprolol Succinate 50 Mg Xr24h-Tab (Metoprolol Succinate) .... Take One Tablet By Mouth Daily 7)  Lipitor 40 Mg Tabs (Atorvastatin Calcium) .... Take One Tablet By Mouth Daily. 8)  Fish Oil   Oil (Fish Oil) .... 1200mg  Four Times A Day 9)  Multivitamins   Tabs (Multiple Vitamin) .... Once Daily 10)  Calcium Carbonate-Vitamin D 600-400 Mg-Unit  Tabs (Calcium Carbonate-Vitamin D) .... Two Times A Day 11)  Alprazolam 0.5 Mg Tabs (Alprazolam) .... As Needed 12)  Travatan Z 0.004 % Soln (Travoprost) .[name].. 1 Drop Left Eye [facility] 13)  Istalol 0.5 % Soln (Timolol Maleate) .[name].. 1 Drop in The Am 14)  Hydrocodone-Acetaminophen 5-325 Mg Tabs (Hydrocodone-Acetaminophen) .[name].. 1-2 By Mouth Q 4-6 Hours As Needed Headache  Allergies (verified): 1)  Codeine 2)  Erythromycin  Social History: Reviewed history from 05/16/2009 and no changes required. 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 History of smoking 2 packs per day for many years, quit 1984 Retired Divorced   Complete Medication List: 1)  Lisinopril 5 Mg Tabs (Lisinopril) .[name].. 1 tablet by mouth daily 2)  Aspirin 81 Mg Tbec (Aspirin)  .... One by mouth every day 3)  Hydrochlorothiazide 12.5 Mg Tabs (Hydrochlorothiazide) .... Take 1 tab  by mouth every morning 4)  Omeprazole 20 Mg Cpdr (Omeprazole) .... One by mouth daily 5)  Fluoxetine Hcl 20 Mg Tabs (Fluoxetine hcl) .... Take 1 tab by mouth daily 6)  Metoprolol Succinate 50 Mg Xr24h-tab (Metoprolol succinate) .... Take one tablet by mouth daily 7)  Lipitor 40 Mg Tabs (Atorvastatin calcium) .... Take one tablet by mouth daily. 8)  Fish Oil Oil (Fish oil) .... 1200mg  four times a day 9)  Multivitamins Tabs (Multiple vitamin) .... Once daily 10)  Calcium Carbonate-vitamin D 600-400 Mg-unit Tabs (Calcium carbonate-vitamin d) .... Two times a day 11)  Alprazolam 0.5 Mg Tabs (Alprazolam) .... As needed 12)  Travatan Z 0.004 % Soln (Travoprost) .[name].. 1 drop left eye at hs 13)  Istalol 0.5 % Soln (Timolol maleate) .[name].. 1 drop in the am 14)  Hydrocodone-acetaminophen 5-325 Mg Tabs (Hydrocodone-acetaminophen) .[name].. 1-2 by mouth q 4-6 hours as needed headache  Appended Document:  Duplicate entry. See note DOS 01/15/11

## 2011-01-24 NOTE — Assessment & Plan Note (Signed)
Summary: SINUS INF/CJR   Primary Care Provider:  Evelena Peat MD   History of Present Illness:  patient presents clinically as a work in for evaluation of possible sinus infection. Meds one-week history of sore throat, sweats without fever chills as well as frontal and maxillary sinus pressure and pain. also has nonproductive cough as well as nasal congestion and drainage. Has associated tooth pain. No sick exposure. Does not feel spontaneous improvement. Has taken over-the-counter medication without significant improvement.  no alleviating or exacerbating factors. no other complaints.  Allergies: 1)  Codeine 2)  Erythromycin  Past History:  Past medical, surgical, family and social histories (including risk factors) reviewed for relevance to current acute and chronic problems.  Past Medical History: Reviewed history from 08/04/2009 and no changes required. 1. 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   February 19, 2006 cardiac catheterization revealing normal            coronary arteries with an EF of 50%. EF 35-40% by ECHO.      b.   Echo 2008: EF 40-50% with mild diffuse            LV hypokinesis and abnormal left ventricular relaxation.  Mild AI.      c.   Echo 8/09 EF 55-60%. Trivial AI     d.   CPX 4/07, pVO2: 20.6 (105% predicted) slope of 42.7.  pRER 1.09.            There was a mild restriction on her spirometry. 2. Hyperlipidemia 3. Hypertension 4. Overweight 5. Gastroesophageal reflux disease Depression  Past Surgical History: Reviewed history from 05/16/2009 and no changes required. 1. cholecystectomy 2008 2. Hyterectomy 1998  Family History: Reviewed history from 05/16/2009 and no changes required. Remarkable for her father having had heart disease. Otherwise, noncontributory. Family History of Arthritis Family History High cholesterol  Social History: Reviewed history from 05/16/2009 and no changes required. 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 History of smoking 2 packs per day for many years, quit 1984 Retired Divorced  Review of Systems      See HPI  Physical Exam  General:  Well-developed,well-nourished,in no acute distress; alert,appropriate and cooperative throughout examination Head:  Normocephalic and atraumatic without obvious abnormalities. No apparent alopecia or balding. slight tenderness to palpation along bilateral maxillary sinuses. Eyes:  pupils equal, pupils round, corneas and lenses clear, and no injection.   Ears:  External ear exam shows no significant lesions or deformities.  Otoscopic examination reveals clear canals, tympanic membranes are intact bilaterally without bulging, retraction, inflammation or discharge. Hearing is grossly normal bilaterally. Nose:  External nasal examination shows no deformity or inflammation. Nasal mucosa are pink and moist without lesions or exudates. Mouth:  Oral mucosa and oropharynx without lesions or exudates.  Teeth in good repair. Neck:  No deformities, masses, or tenderness noted. Lungs:  Normal respiratory effort, chest expands symmetrically. Lungs are clear to auscultation, no crackles or wheezes.   Impression & Recommendations:  Problem # 1:  OTHER ACUTE SINUSITIS (ICD-461.8) Assessment New  begin antibiotic therapy. Continue OTC medication for symptomatic relief. Follow up if no improvement or worsening. Her updated medication list for this problem includes:    Amoxicillin 875 Mg Tabs (Amoxicillin) ..... One by mouth bid  Complete Medication List: 1)  Lisinopril 5 Mg Tabs (Lisinopril) .[name].. 1 tablet by mouth daily 2)  Aspirin 81 Mg Tbec (Aspirin) .... One by mouth every day 3)  Hydrochlorothiazide  12.5 Mg Tabs (Hydrochlorothiazide) .... Take 1 tab  by mouth every morning 4)  Omeprazole 20 Mg Cpdr (Omeprazole) .... One by mouth daily 5)  Fluoxetine Hcl 20 Mg Tabs (Fluoxetine hcl) .... Take 1 tab by mouth daily 6)  Metoprolol Succinate 50 Mg Xr24h-tab  (Metoprolol succinate) .... Take one tablet by mouth daily 7)  Lipitor 40 Mg Tabs (Atorvastatin calcium) .... Take one tablet by mouth daily. 8)  Fish Oil Oil (Fish oil) .... 1200mg  four times a day 9)  Multivitamins Tabs (Multiple vitamin) .... Once daily 10)  Calcium Carbonate-vitamin D 600-400 Mg-unit Tabs (Calcium carbonate-vitamin d) .... Two times a day 11)  Alprazolam 0.5 Mg Tabs (Alprazolam) .... As needed 12)  Travatan Z 0.004 % Soln (Travoprost) .[name].. 1 drop left eye at hs 13)  Istalol 0.5 % Soln (Timolol maleate) .[name].. 1 drop in the am 14)  Hydrocodone-acetaminophen 5-325 Mg Tabs (Hydrocodone-acetaminophen) .[name].. 1-2 by mouth q 4-6 hours as needed headache 15)  Amoxicillin 875 Mg Tabs (Amoxicillin) .... One by mouth bid Prescriptions: AMOXICILLIN 875 MG TABS (AMOXICILLIN) one by mouth bid  #14 x 0   Entered and Authorized by:   Edwyna Perfect MD   Signed by:   Edwyna Perfect MD on 01/15/2011   Method used:   Electronically to        [facility]  [phone]* (retail)       [address] [place], [place]  [address]       Ph: [phone] or [phone]       Fax: [phone]   RxID:   [id number]    Orders Added: 1)  Est. Patient Level III [10626]

## 2011-01-24 NOTE — Progress Notes (Signed)
  Phone Note Other Incoming   Request: Send information Summary of Call: Request for records received from  [employer]. Request forwarded to [facility].     

## 2011-01-29 ENCOUNTER — Other Ambulatory Visit: Payer: Self-pay | Admitting: Family Medicine

## 2011-01-29 DIAGNOSIS — F329 Major depressive disorder, single episode, unspecified: Secondary | ICD-10-CM

## 2011-02-13 ENCOUNTER — Encounter (INDEPENDENT_AMBULATORY_CARE_PROVIDER_SITE_OTHER): Payer: Self-pay | Admitting: *Deleted

## 2011-02-19 NOTE — Letter (Signed)
Summary: Appointment - Reschedule  [facility], Main Office  [address]   [place], [place] [address]   Phone: [phone]  Fax: [phone]     February 13, 2011 MRN: [id number]   [facility] [address] [place], [place]  [address]   Dear Ms. Berstein Hilliker [facility],   Due to a change in our office schedule, your appointment on March 2,2012 at 2:00 must be changed.  It is very important that we reach you to reschedule this appointment. We look forward to participating in your health care needs. Please contact us at the number listed above at your earliest convenience to reschedule this appointment.     Sincerely,  Judie Grieve [facility] Scheduling Team

## 2011-02-22 ENCOUNTER — Ambulatory Visit: Payer: Self-pay | Admitting: Internal Medicine

## 2011-03-01 ENCOUNTER — Encounter: Payer: Self-pay | Admitting: Internal Medicine

## 2011-03-11 ENCOUNTER — Telehealth: Payer: Self-pay | Admitting: Internal Medicine

## 2011-03-14 ENCOUNTER — Encounter: Payer: Self-pay | Admitting: Internal Medicine

## 2011-03-14 ENCOUNTER — Ambulatory Visit (INDEPENDENT_AMBULATORY_CARE_PROVIDER_SITE_OTHER): Payer: Medicare Other | Admitting: Internal Medicine

## 2011-03-14 VITALS — BP 100/58 | HR 73 | Wt 184.0 lb

## 2011-03-14 DIAGNOSIS — R0609 Other forms of dyspnea: Secondary | ICD-10-CM

## 2011-03-14 DIAGNOSIS — I428 Other cardiomyopathies: Secondary | ICD-10-CM

## 2011-03-14 DIAGNOSIS — R0600 Dyspnea, unspecified: Secondary | ICD-10-CM | POA: Insufficient documentation

## 2011-03-14 NOTE — Assessment & Plan Note (Addendum)
Currently with NYHA II-III dyspnea. I walked her down the hall today and sats ranged 92-96% and she was fairly winded. However, I do not see any volume overload. I suspect her dyspnea is multifactorial. We will repeat echo to assess both diastolic and systolic parameters as well as her pulmonary pressures. I have stressed need to be more active and start a routine walking program which we went over. I will f/u with her after her echo. Given fatigue I would have low threshold to work up for OSA. Given low volts on ECG also need to consider possible amyloid (my suspicion for this is low). If LV thick on echo consider SPEP/UPEP.

## 2011-03-14 NOTE — Patient Instructions (Signed)
Your physician recommends that you schedule a follow-up appointment in:  6 months with Dr. Gala Romney Your physician has requested that you have an echocardiogram. Echocardiography is a painless test that uses sound waves to create images of your heart. It provides your doctor with information about the size and shape of your heart and how well your heart's chambers and valves are working. This procedure takes approximately one hour. There are no restrictions for this procedure.

## 2011-03-14 NOTE — Progress Notes (Signed)
HPI: Jessica Mcneil is a delightful 70 year old woman with a history of a nonischemic cardiomyopathy which is now recovered. Most recent ejection fraction in 8/09 was 55-60%. She returns for routine followup.  1. 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   February 19, 2006 cardiac catheterization revealing normal            coronary arteries with an EF of 50%. EF 35-40% by ECHO.      b.   Echo 2008: EF 40-50% with mild diffuse            LV hypokinesis and abnormal left ventricular relaxation.  Mild AI.      c.   Echo 8/09 EF 55-60%. Trivial AI     d.   CPX 4/07, pVO2: 20.6 (105% predicted) slope of 42.7.  pRER 1.09.            There was a mild restriction on her spirometry.  She is doing fairly well though she is having problems with her sleep and feels fatigued. Bowling 1x/week without problem. Not exercising as much anymore. When she walks at store can walk the whole store but has to go slowly and can only do one store at a time. Occasional chest soreness. No angina. No significant edema. No orthopnea/PND.  Mild snoring. .  ROS: All systems negative except as listed in HPI, PMH and Problem List.  Past Medical History  Diagnosis Date  . CHF (congestive heart failure)     due to non ischemic cardiomyopathy  . HLD (hyperlipidemia)   . HTN (hypertension)   . Overweight   . GERD (gastroesophageal reflux disease)   . Depression     Current Outpatient Prescriptions  Medication Sig Dispense Refill  . ALPRAZolam (XANAX) 0.5 MG tablet Take 0.5 mg by mouth at bedtime as needed.        [name] aspirin 81 MG tablet Take 81 mg by mouth daily.        [name] atorvastatin (LIPITOR) 40 MG tablet Take 40 mg by mouth daily.        . Calcium Carbonate-Vit D-Min 600-400 MG-UNIT TABS Take by mouth 2 (two) times daily.        [name] FLUoxetine (PROZAC) 20 MG capsule TAKE 1 CAPSULE EVERY DAY  90 capsule  0  . hydrochlorothiazide (,MICROZIDE/HYDRODIURIL,) 12.5 MG capsule Take 12.5 mg by mouth daily.        [name]  HYDROcodone-acetaminophen (NORCO) 5-325 MG per tablet Take 1 tablet by mouth every 6 (six) hours as needed.        [name] lisinopril (PRINIVIL,ZESTRIL) 5 MG tablet Take 5 mg by mouth daily.        . metoprolol (TOPROL-XL) 50 MG 24 hr tablet Take 50 mg by mouth daily.        . Multiple Vitamin (MULTIVITAMIN) tablet Take 1 tablet by mouth daily.        . Omega-3 Fatty Acids (FISH OIL) 1200 MG CAPS Take by mouth 4 (four) times daily.        [name] omeprazole (PRILOSEC) 20 MG capsule Take 20 mg by mouth daily.        . Timolol Maleate (ISTALOL) 0.5 % (DAILY) SOLN Apply 1 drop to eye daily.        . travoprost, benzalkonium, (TRAVATAN) 0.004 % ophthalmic solution Place 1 drop into the left eye at bedtime.           PHYSICAL EXAM: Filed Vitals:   03/14/11 1516  BP:  100/58  Pulse: 73   General:  Well appearing. No resp difficulty HEENT: normal Neck: supple. JVP flat. Carotids 2+ bilaterally; no bruits. No lymphadenopathy or thryomegaly appreciated. Cor: PMI normal. Regular rate & rhythm. No rubs, murmurs. +s4 Lungs: clear Abdomen: obese soft, nontender, nondistended. No hepatosplenomegaly. No bruits or masses. Good bowel sounds. Extremities: no cyanosis, clubbing, rash, edema Neuro: alert & orientedx3, cranial nerves grossly intact. Moves all 4 extremities w/o difficulty. Affect pleasant.    ECG: NSR 73. Low volts. Poor R wave progression. ?inferior Qs. Non specific T wave flattening.    ASSESSMENT & PLAN:

## 2011-03-14 NOTE — Assessment & Plan Note (Signed)
See discussion under cardiomyopathy section.

## 2011-03-21 NOTE — Progress Notes (Signed)
Summary: refill   Phone Note Refill Request Message from:  Patient on March 11, 2011 3:49 PM  Refills Requested: Medication #1:  LISINOPRIL 5 MG TABS 1 tablet by mouth daily [facility]  Initial call taken by: Judie Grieve,  March 11, 2011 3:49 PM  Follow-up for Phone Call        took phone call gave #[phone] with 4 refill DAJ Follow-up by: Marrion Coy, CNA,  March 11, 2011 3:57 PM

## 2011-03-25 ENCOUNTER — Ambulatory Visit (HOSPITAL_COMMUNITY): Payer: Medicare Other | Attending: Family Medicine | Admitting: Radiology

## 2011-03-25 DIAGNOSIS — I509 Heart failure, unspecified: Secondary | ICD-10-CM

## 2011-03-25 DIAGNOSIS — I428 Other cardiomyopathies: Secondary | ICD-10-CM | POA: Insufficient documentation

## 2011-04-09 ENCOUNTER — Other Ambulatory Visit: Payer: Self-pay | Admitting: Internal Medicine

## 2011-05-01 ENCOUNTER — Other Ambulatory Visit: Payer: Self-pay | Admitting: Family Medicine

## 2011-05-07 NOTE — Assessment & Plan Note (Signed)
[facility]                            CARDIOLOGY OFFICE NOTE   NAME:Sneed, LAMA NARAYANAN                     MRN:          [id number]  DATE:09/24/2007                            DOB:          13-Jun-1941    PRIMARY CARDIOLOGIST:  Dr. Arvilla Meres.   PATIENT PROFILE:  A 70 year old Caucasian female with a history of  nonischemic cardiomyopathy who presents for followup.   PROBLEM LIST:  1. Nonischemic cardiomyopathy.      a.     February 19, 2006 cardiac catheterization revealing normal       coronary arteries with an EF of 50%.      b.     June 23, 2007 2D echocardiogram, EF 40-50% with mild diffuse       LV hypokinesis and abnormal left ventricular relaxation.  There       was mild AI.  This EF was improved from previously documented EF       in February 2007 when it was read out as 35-40%.  2. History of hypertension.  3. Hyperlipidemia.  4. Status post cholecystectomy.   HISTORY OF PRESENT ILLNESS:  This is a 70 year old Caucasian female with  the above problem list.  She was last seen in the clinic in June of this  year.  She presents for followup today.  She has remained active around  the house and walking the dogs without any significant limiting dyspnea.  She has lots of hills around her house, and when she really goes at an  increased pace, she does have some dyspnea, but overall feels like she  is doing well from a dyspnea standpoint.  She denies any chest pain.  She denies any PND, orthopnea, syncope, edema, or early satiety, and  notes the fact that her appetite is quite good.  Over the past couple of  weeks, she has had some light-headedness when moving too quickly from 1  position to another.  She never felt like she was going to pass out, and  this resolved after just a couple seconds of steadying herself.   HOME MEDICATIONS:  1. Lisinopril 5 mg daily.  2. Simvastatin 40 mg daily.  3. Xanax 0.5 mg p.r.n.  4. Aspirin 81 mg  daily.  5. Omega 3 1200 mg 4 times a day.  6. Calcium 1000 mg 2 tabs b.i.d.  7. Multivitamin daily.  8. Prilosec p.r.n.  9. Hydrochlorothiazide 12.5 mg daily.  10.Metoprolol XL 50 mg daily.  11.Vitamin D weekly.   PHYSICAL EXAM:  Blood pressure lying 105/65 with a heart rate of 79,  blood pressure sitting 101/69 with a heart rate of 86 (she became light-  headed), blood pressure standing 111/72, heart rate 89.  At 2 minutes  she was 108/70 with a heart rate of 86, and at 5 minutes she was 108/72  with a heart rate of 86 with symptom resolution.  Her weight is 177  pounds, which is stable from her last visit.  A pleasant white female in no acute distress.  Alert and oriented x3.  NECK:  No bruits or JVD.  LUNGS:  Respirations are regular and unlabored.  Clear to auscultation.  CARDIAC:  Regular S1, S2.  No S3 or S4 murmurs.  ABDOMEN:  Round, soft, and nontender.  Bowel sounds present x4.  EXTREMITIES:  Warm and dry.  Pink.  No cyanosis, clubbing, or edema.  Dorsalis pedis, posterior tibial pulses 2+ and equal bilaterally.   ACCESSORY CLINICAL FINDINGS:  EKG shows sinus rhythm at a rate of 86  beats per minute without any acute ST or T wave changes.   ASSESSMENT AND PLAN:  1. Nonischemic cardiomyopathy.  The patient is euvolemic and appears      to be doing well without significant limitations in activity.  She      remains on beta blocker, ACE inhibitor, and low-dose diuretic      therapy.  She has had some light-headedness.  However, exam is not      markedly orthostatic.  I recommended that she try and be more      deliberate with position changes and recommended dangling her legs      prior to going from a lying to a seated or standing position.  I      will not change her dose of beta blocker or ACE today.  2. Hypertension.  As noted above.  If anything, she is mildly      hypotensive.  Her pressure is well-controlled.  3. Light-headedness, see #1.  4. Hyperlipidemia.   Continue Statin therapy.   DISPOSITION:  Follow up with Dr. Gala Romney in 4 months or sooner if  necessary.      Nicolasa Ducking, ANP  Electronically Signed      Arturo Morton. Riley Kill, MD, [facility]  Electronically Signed   CB/MedQ  DD: 09/24/2007  DT: 09/24/2007  Job #: [id number]

## 2011-05-07 NOTE — Assessment & Plan Note (Signed)
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. She has a history of a  nonischemic cardiomyopathy with an EF of 40% which is now recovered. She  also has a history of hypertension and hyperlipidemia. She presents  today for routine followup.   She is doing very well. She denies any chest pain or shortness of  breath, no lower extremity edema. She is quite active with her  activities without any difficulty. She recently retired from [employer]  after working there over 40 years. She is able to tolerate the extended  release metoprolol much better than the short acting. She does  occasionally get light-headed but has not had any frank syncope or  palpitations.   CURRENT MEDICATIONS:  1. Lisinopril 5 mg a day.  2. Simvastatin 40 a day.  3. Xanax 0.5 p.r.n.  4. Aspirin 81 a day.  5. Multivitamin.  6. Prilosec.  7. Hydrochlorothiazide 12.5.  8. Metoprolol ER 50 a day.   PHYSICAL EXAMINATION:  GENERAL:  She is well-appearing. She ambulates  around the clinic without any respiratory difficulty.  VITAL SIGNS:  Blood pressure is 88/60, heart rate 68, weight is 177.  HEENT:  Normal.  NECK:  Supple. There is no JVD. Carotids are 2+ bilaterally without any  bruits. There is no lymphadenopathy or thyromegaly.  CARDIAC:  PMI is nondisplaced. She has a regular rate and rhythm, no  murmurs, rubs or gallops.  LUNGS:  Clear.  ABDOMEN:  Obese, nontender, nondistended. There is no  hepatosplenomegaly, no bruits, no masses appreciated.  EXTREMITIES:  Warm with no cyanosis, clubbing or edema. There is no  rash, good distal pulses.  NEUROLOGIC:  Alert and oriented x3. Cranial nerves II-XII  are intact.  Moves all 4 extremities without difficulty. Affect is bright.   EKG shows normal sinus rhythm at a rate of 68 with mild T wave  flattening throughout and poor R wave progression.   ASSESSMENT/PLAN:  History of nonischemic cardiomyopathy with normal  ejection fraction by last echocardiogram. She is functional class 1.  Given her low blood pressure, we will not titrate any of her medications  today. We will check a repeat 2-D echocardiogram as she has not had one  in about a year for surveillance purposes and see her back in 4-6  months.     Bevelyn Buckles. Bensimhon, MD  Electronically Signed    DRB/MedQ  DD: 05/27/2007  DT: 05/27/2007  Job #: [id number]

## 2011-05-07 NOTE — Assessment & Plan Note (Signed)
[facility]                            CARDIOLOGY OFFICE NOTE   NAME:Jessica Mcneil, Jessica Mcneil                     MRN:          [id number]  DATE:01/27/2008                            DOB:          Apr 11, 1941    INTERVAL HISTORY:  Jessica Mcneil is a delightful 70 year old woman with a  history of nonischemic cardiomyopathy, ejection fraction of 40-50%  range.  She also has a history of hypertension and hyperlipidemia.  She  returns today for routine follow-up.   She is doing great.  She denies any chest pain or shortness of breath.  She has been walking when the weather is nice.  She does get a little  bit dizzy when she stands up too quickly.   CURRENT MEDICATIONS:  1. Lisinopril 5 mg a day.  2. Simvastatin 40 mg a day.  3. Xanax p.r.n.  4. Aspirin 81 mg a day.  5. Omega-3.  6. Multivitamin.  7. Hydrochlorothiazide 12.5 mg.  8. Fluoxetine 20 mg.  9. Metoprolol ER 50 mg a day.   PHYSICAL EXAM:  She is well-appearing, in no acute distress.  Ambulates  around the clinic briskly without any respiratory difficulty.  Blood pressure is 98/68, heart rate is 68.  Weight is 179.  HEENT:  Normal.  NECK:  Supple.  There is no JVD.  Carotids are 2+ bilaterally without  any bruits.  There is no lymphadenopathy or thyromegaly.  CARDIAC:  PMI is nondisplaced.  She has a regular rate and rhythm with a  soft systolic ejection at the right sternal border and a physiologically  split S2.  LUNGS:  Clear.  ABDOMEN:  Soft, nontender, nondistended.  There is no  hepatosplenomegaly, no bruits, no masses.  Good bowel sounds.  EXTREMITIES:  Warm with no cyanosis, clubbing or edema.  NEUROLOGIC:  Alert and x3.  Cranial nerves II-XII are intact.  Moves all  four extremities without difficulty.  Affect is pleasant.   EKG shows normal sinus rhythm at a rate of 68 with just minimal T-wave  flattening inferior laterally.   ASSESSMENT/PLAN:  1. Nonischemic cardiomyopathy.  Doing  great.  She is [employer] Heart      Association functional class I.  She is euvolemic.  Unfortunately,      I cannot tolerate titrate up of her medications due to her blood      pressure.  We will check an echo at her next visit.  2. Hyperlipidemia.  Most recent LDL was 126.  By strict guidelines.      This is at goal for her; however, I would like to see her LDL be      below 100.  We will switch her from Zocor 40 mg to Lipitor 40 mg.   DISPOSITION:  Return to clinic in 6 months.     Jessica Buckles. Bensimhon, MD  Electronically Signed    DRB/MedQ  DD: 01/27/2008  DT: 01/28/2008  Job #: [id number]

## 2011-05-10 NOTE — Op Note (Signed)
   NAME:  Jessica Mcneil, Jessica Mcneil                        ACCOUNT NO.:  [id number]   MEDICAL RECORD NO.:  [id number]                   PATIENT TYPE:  AMB   LOCATION:  ENDO                                 FACILITY:  [facility]   PHYSICIAN:  Anselmo Rod, M.D.               DATE OF BIRTH:  11-01-1941   DATE OF PROCEDURE:  05/20/2003  DATE OF DISCHARGE:                                 OPERATIVE REPORT   PROCEDURE PERFORMED:  Screening colonoscopy.   ENDOSCOPIST:  Charna Elizabeth, M.D.   INSTRUMENT USED:  Olympus adjustable pediatric video colonoscope.   INDICATIONS FOR PROCEDURE:  Screening colonoscopy is being performed in a 22-  year-old white female.  Rule out colonic polyps, masses, hemorrhoids, etc.   PREPROCEDURE PREPARATION:  Informed consent was procured from the patient.  The patient was fasted for eight hours prior to the procedure and prepped  with a bottle of magnesium citrate and a gallon of GoLYTELY the night prior  to the procedure.   PREPROCEDURE PHYSICAL:  The patient had stable vital signs.  Neck supple.  Chest clear to auscultation.  S1 and S2 regular.  Abdomen soft with normal  bowel sounds.   DESCRIPTION OF PROCEDURE:  The patient was placed in left lateral decubitus  position and sedated with 100 mg of Demerol and 10 mg of Versed  intravenously.  Once the patient was adequately sedated and maintained on  low flow oxygen and continuous cardiac monitoring, the Olympus video  colonoscope was advanced from the rectum to the cecum without difficulty.  The patient had a fairly good prep.  There was some resident stool in the  colon.  Multiple washes were done.  No masses, polyps, erosions, ulcerations  or diverticula were noted.  Retroflexion in the rectum revealed on  abnormalities.  The appendicular orifice and ileocecal valve were clearly  visualized and photographed.   IMPRESSION:  Normal colonoscopy.   RECOMMENDATIONS:  1. Repeat colorectal cancer screening is  recommended in the next 10 years     unless the patient develops any abnormal symptoms in the interim.  2. A high fiber diet with liberal fluid intake has been advised.  3. Outpatient follow-up as need arises in the future.                                                   Anselmo Rod, M.D.    JNM/MEDQ  D:  05/20/2003  T:  05/20/2003  Job:  [id number]   cc:   Teena Irani. Arlyce Dice, M.D.  [address]  [place]  [place] [address]  Fax: [phone]

## 2011-05-10 NOTE — Assessment & Plan Note (Signed)
Jessica Mcneil                             PULMONARY OFFICE NOTE   NAME:Jessica Mcneil, [name]                     MRN:          [id number]  DATE:01/08/2007                            DOB:          [birth year]    HISTORY OF PRESENT ILLNESS:  The patient is a 70 year old female whom I  have been asked to see for possible sleep apnea.  The patient states  that she has been known to have snoring, but no one has ever mentioned  pauses in her breathing during sleep.  She does have an occasional  snoring arousal.  The patient typically gets to bed between 9:30 and  10:30 and gets up at 6 a.m. to start her day.  Half the time she is  rested and half the time she is un-rested.  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  She feels that she has  been doing much better since losing a little bit of weight and  exercising on a more consistent basis.  The patient does take an  afternoon nap every day for approximately 30 minutes and feels quite  refreshed after this.  She has no difficulties watching TV or movies in  the evenings, and sleepiness when driving is a non-issue.   PAST MEDICAL HISTORY:  1. Significant for hypertension.  2. History of dyslipidemia.  3. History of cardiomyopathy that she has known for about 1 year.  4. Status post cholecystectomy.   CURRENT MEDICATIONS:  Totally unknown since the chart has been taken  from the pulmonary division.   The patient is intolerant or allergic to CODEINE and ERYTHROMYCIN.   SOCIAL HISTORY:  The patient has a history of smoking 2 packs per day  for many years, but has not smoked since 1984.   FAMILY HISTORY:  Remarkable for her father having had heart disease.  Otherwise, noncontributory.   REVIEW OF SYSTEMS:  As per the history of present illness.  Also see the  patient intake form documented in the chart.   PHYSICAL EXAM:  GENERAL:  She is an overweight female in no acute  distress.  Blood pressure 110/68, pulse 69, temperature 97.7, weight 183 pounds.  She is 5 feet 7 inches tall.  O2 saturation on room air is 97%.  HEENT:  Pupils are equal, round, and reactive to light and  accommodation.  Extraocular muscles are intact.  Nares are patent with  mild septal deviation to the left.  Oropharynx is clear.  NECK:  Supple without JVD or lymphadenopathy.  There is no palpable  thyromegaly.  CHEST:  Totally clear to auscultation.  CARDIAC:  Regular rate and rhythm.  No murmurs, rubs, or gallops.  ABDOMEN:  Soft and nontender with good bowel sounds.  GENITAL, RECTAL, AND BREASTS:  Not done and not indicated.  LOWER EXTREMITIES:  Difficult to assess because of long boots in place.  NEUROLOGIC:  She is alert and oriented in no obvious observable motor  defects.   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  Certainly, I cannot exclude the possibility of very  mild obstructive sleep apnea, but her history really is not significant  for more severe forms.  I had a long conversation with her about the  pathophysiology of sleep apnea, including short term quality of life  issues and the cardiovascular issues which are more long term.  I have  told her, with her known cardiomyopathy and her weight that she really  needs to work aggressively on weight loss and this will certainly  improve.  She does not feel that it is effecting her quality of life at  this point enough to go through the sleep study or consider more  aggressive treatments.   PLAN:  The patient is going to work on weight loss and she will call me  if she feels that she is getting more symptomatic or she is unable to  make progress in weight loss.  At that point in time I would schedule  her for NPSG and follow up thereafter.     Jessica Share, MD,FCCP  Electronically Signed     KMC/MedQ  DD: 01/22/2007  DT: 01/22/2007  Job #: [id number]   cc:   Bevelyn Buckles. Bensimhon, MD

## 2011-05-10 NOTE — Assessment & Plan Note (Signed)
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  She also has a history of  hypertension and hyperlipidemia.  She presents today for routine  followup.  When we last saw her, she did have some mild lower extremity  edema.  We started her on low dose hydrochlorothiazide.  She returns  today saying her edema is much better.  She has also been much more  active, has been exercising on the treadmill and has lost 8 pounds since  we last saw her.  She denies any chest pain or shortness of breath.  No  orthopnea or PND.  She actually feels much better.   PAST MEDICAL HISTORY:  For complete past medical history, see my note of  December 19, 2006.   CURRENT MEDICATIONS:  1. Lisinopril 5 a day.  2. Metoprolol 25 a day.  3. Simvastatin 40.  4. Xanax p.r.n.  5. Aspirin 81.  6. Omega 3 fish oil.  7. Calcium.  8. Boniva.  9. Multivitamin.  10.Prilosec.  11.Hydrochlorothiazide 12.5 a day.   PHYSICAL EXAMINATION:  GENERAL:  She is well-appearing.  She ambulates  around the clinic without any respiratory difficulty.  VITAL SIGNS:  Blood pressure is 110/62, heart rate 78.  Weight is 178.  HEENT:  Sclerae are anicteric.  EOMI.  There is no xanthelasma.  Mucous  membranes are moist.  Oropharynx is clear.  There is good dentition.  NECK:  Supple.  No JVD.  Carotids are 2+ bilaterally without any bruits.  There is no lymphadenopathy or thyromegaly.  CARDIAC:  Regular rate and rhythm.  There is a soft systolic ejection  murmur on the right sternal border.  There is no  rub or gallop.  LUNGS:  Clear.  ABDOMEN:  Soft, nontender,  nondistended.  No hepatosplenomegaly.  No  bruits.  No masses appreciated.  Good bowel sounds.  EXTREMITIES:  Warm with no clubbing, cyanosis or edema.  NEURO:  Alert and oriented x3.  Cranial nerves II-XII are intact.  Moves  all four extremities without difficulty.   ASSESSMENT/PLAN:  1. History of congestive heart failure secondary to nonischemic      cardiomyopathy:  Her myopathy has resolved, and she currently has      no symptoms of heart failure.  Given her history, I would like to      titrate her Toprol, at least up to 50 mg a day, and we will do that      today.  I may also consider increasing her lisinopril in the      future.  2. Hyperlipidemia:  This is well controlled.  The most recent lipids      looked great.  3. Overweight:  She is very much  motivated to continue with an      exercise and weight loss program.  I congratulated her on this.  We      will see her back in several months for routine followup.     Bevelyn Buckles. Bensimhon, MD  Electronically Signed    DRB/MedQ  DD: 01/26/2007  DT: 01/26/2007  Job #: [id number]

## 2011-05-10 NOTE — Procedures (Signed)
NAME:  Jessica Mcneil, Jessica Mcneil NO.:  [id number]   MEDICAL RECORD NO.:  [id number]          PATIENT TYPE:  OUT   LOCATION:  RAD                           FACILITY:  [facility]   PHYSICIAN:  Vida Roller, M.D.   DATE OF BIRTH:  1941/06/29   DATE OF PROCEDURE:  01/30/2006  DATE OF DISCHARGE:                                  ECHOCARDIOGRAM   TAPE NUMBER:  [id number]   TAPE COUNT:  [phone]   HISTORY OF PRESENT ILLNESS:  This is a 70 year old woman with hypertension  and question of a cardiomyopathy.  Technical quality of this study is  adequate.   M-MODE TRACINGS:  The aorta is 25 mm.   Left atrium is 36 mm.   Septum is 11 mm.   Posterior wall is 9 mm.   Left ventricular diastolic dimension 37 mm.   Left ventricular systolic dimension 31 mm.   A 2D AND DOPPLER IMAGING:  Left ventricle is normal size.  There is  depressed LV systolic function with an estimated ejection fraction of 35-  40%.  There is global hypokinesis seen without significant left ventricular  hypertrophy.   Right ventricle is top normal in size with slightly depressed RV systolic  function.   Both atrial appear to be normal size.   There is trace aortic insufficiency with no stenosis.   There is mild mitral regurgitation with no stenosis.   There is mild tricuspid regurgitation with no stenosis.   Right ventricular systolic pressure was not able to be obtained as the  tricuspid regurgitation jet was not easily identified.   There is no pericardial effusion.   Ascending aorta and inferior vena cava were not well seen.      Vida Roller, M.D.  Electronically Signed     JH/MEDQ  D:  01/30/2006  T:  01/30/2006  Job:  [id number]

## 2011-05-10 NOTE — Cardiovascular Report (Signed)
NAME:  Jessica Mcneil, HOUSLEY NO.:  [id number]   MEDICAL RECORD NO.:  [id number]          PATIENT TYPE:  OIB   LOCATION:  1963                         FACILITY:  [facility]   PHYSICIAN:  Vida Roller, M.D.   DATE OF BIRTH:  [birth year]   DATE OF PROCEDURE:  02/19/2006  DATE OF DISCHARGE:                              CARDIAC CATHETERIZATION   PRIMARY:  Dr. Dara Hoyer   Ms. Hesse is a woman that I follow who has a newly diagnosed  cardiomyopathy with class I heart failure.  She is here for evaluation of  significant coronary disease and assessment of right heart pressures.  She  is under therapy.   PROCEDURES PERFORMED:  1.  Left heart catheterization.  2.  Right heart catheterization.  3.  Selective coronary angiography.  4.  Left ventriculography.   PROCEDURE IN DETAIL:  After obtaining informed consent the patient was  brought to the cardiac catheterization laboratory in the fasting state.  There she was prepped and draped in the usual sterile manner.  Local  anesthetic was obtained over the right groin using 1% lidocaine without  epinephrine.  Right femoral artery was cannulated using the modified  Seldinger technique with a 4-French 10 cm sheath and the right femoral vein  was cannulated using the modified Seldinger technique with a 7-French 10 cm  sheath.  Right heart catheterization was performed with Swan-Ganz catheter.  Left heart catheterization was performed with a Judkins left #4, a Judkins  right #4, and a pigtail catheter.  Pigtail catheter was used for left  ventriculography in the 30 degree RAO view.  At the conclusion of procedure  the catheters were removed, patient moved back to the cardiology holding  area where the femoral artery sheath and the femoral venous sheath was  removed.  Hemostasis was obtained using direct manual pressure.  At the  conclusion of the hold there was no evidence of ecchymosis or hematoma  formation and distal pulses were  intact.  Total fluoroscopic time was 6.9  minutes.  Total ionized contrast use was 100 mL.   RESULTS:  Right heart pressures:  Right atrial pressure A-wave 9, V-wave of  6, mean of 5 mmHg.   Right ventricular pressure 35/5 with an end-diastolic pressure of 8 mmHg.   Pulmonary artery pressure 33/11 with a mean of 20 mmHg.   Pulmonary capillary wedge pressure A-wave 13, V-wave 14, mean of 9 mmHg.   Left ventricular pressure 108/5 with an end-diastolic pressure of 12 mmHg.   Aortic pressure 107/65 with a mean pressure of 84 mmHg.   Aortic saturation 90%, pulmonary artery saturation 69%.  Cardiac output by  the Fick method 4.2 with a cardiac index of 2.2 L/minute/sq m.  Cardiac  output by the thermodilution method 4.5 with index of 2.4 L/minute/sq m.   SELECTIVE CORONARY ANGIOGRAPHY:  The left main coronary artery is a large  caliber vessel, is angiographically normal.   The left anterior descending coronary artery is a moderate caliber vessel  and is angiographically normal.   The left circumflex coronary artery is a moderate caliber dominant vessel  which is angiographically normal.   The right coronary artery is a small nondominant vessel which is  angiographically normal.   The left ventriculogram reveals slightly depressed LV systolic function  estimated at 50% with no significant wall motion abnormality and no mitral  regurgitation.   ASSESSMENT:  1.  Non-ischemic cardiomyopathy.  2.  Normal left dominant coronaries.  3.  Normal right heart pressures.  4.  Low normal left ventricular ejection fraction.   PLAN:  This is a lady who is obviously responding very nicely to medical  therapy.  We will continue that at her current dosage and follow her.      Vida Roller, M.D.  Electronically Signed     JH/MEDQ  D:  02/19/2006  T:  02/19/2006  Job:  [id number]

## 2011-05-10 NOTE — Procedures (Signed)
NAME:  [name], Jessica NO.:  [id number]   MEDICAL RECORD NO.:  [id number]          PATIENT TYPE:  OUT   LOCATION:  RAD                           FACILITY:  [facility]   PHYSICIAN:  Gerrit Friends. Dietrich Pates, MD, FACCDATE OF BIRTH:  1941/10/28   DATE OF PROCEDURE:  07/31/2006  DATE OF DISCHARGE:                                  ECHOCARDIOGRAM   REFERRING PHYSICIAN:  Teena Irani. Arlyce Dice, M.D.  Vida Roller, M.D.   CLINICAL DATA:  70 year old woman with cardiomyopathy.   M-MODE:  Aorta 2.4, left atrium 3.9, septum 1, posterior wall 0.9, LV  diastole 3.7, LV systole 3.3, RV diastole.   1. Technically adequate echocardiographic study.  2. Normal left atrium, right atrium and right ventricle.  3. Normal trileaflet aortic valve.  4. Normal mitral, tricuspid and pulmonic valves; normal proximal pulmonary      artery.  5. Trivial aortic insufficiency and mitral regurgitation; physiologic      tricuspid regurgitation; estimated RV systolic pressure at the upper      limit of normal.  6. Normal left ventricular size; no LVH; normal regional and global      function.  7. Normal IVC.      Gerrit Friends. Dietrich Pates, MD, [facility]  Electronically Signed     RMR/MEDQ  D:  07/31/2006  T:  07/31/2006  Job:  [id number]

## 2011-05-10 NOTE — Assessment & Plan Note (Signed)
[facility]                         [place] CARDIOLOGY OFFICE NOTE   NAME:Jessica Mcneil, Jessica Mcneil                     MRN:          [id number]  DATE:07/29/2006                            DOB:          1941-05-24    Jessica Mcneil returns.  This is a lady I follow who has a non-ischemic  dilated cardiomyopathy with reasonably good left ventricular systolic  function that ranges between 40 and 50%, depending in the modality you  choose.  Non-obstructive coronary disease on a heart catheterization done in  February of this year, and Jessica Mcneil is overall New York Heart Associated Class I  for symptoms.   CURRENT MEDICATIONS:  1.  Simvastatin 40 mg once a day.  2.  Lexapro 40 mg once a day.  3.  Calcium 1200 mg once a day.  4.  Boniva monthly.  5.  Aspirin 81 mg a day.  6.  Omega-3 fish oil; Jessica Mcneil takes 3000 mg a day.  7.  Lisinopril 5 mg a day.  8.  Toprol-XL 25 mg a day.   ALLERGIES:  Jessica Mcneil is allergic to CONTRAST.   Jessica Mcneil comes in today for review of her medications and also her cholesterol,  feeling well without any significant complaints.  Symptoms are pretty  stable.  Jessica Mcneil has unfortunately been pretty consistent at gaining weight over  the course of the last few times I have seen her.  I first saw her back in  February and Jessica Mcneil was 177 pounds.  Jessica Mcneil is now up to 184 pounds, and we talked  a little bit about that weight gain.  Blood pressure is 118/76, pulse is 80.  Chest is clear.  Cardiovascular is regular.  Point of maximum impulse is not  palpable.  No significant murmur is noted.  Lower extremities are without  edema.   Jessica Mcneil had a cholesterol panel taken, which showed a total of 196, HDL of 59,  LDL of 109, triglycerides 376.  LFT's were normal.   I think Jessica Mcneil is tolerating the simvastatin and the Omega-3 acids okay.  The  concern, I think, is that her LDL cholesterol is a little bit too high.  I  would like her to get into an exercise program, and I sort  of outlined that  for her.  We went over the results of her cardiopulmonary exercise test a  little bit and talked about that.  Jessica Mcneil had seen Dr. Ladona Ridgel, and was felt to  be at low risk for cardiac sudden death, so I think at this point it seems  very reasonable to get her into an exercise program to try to have her lose  a little bit of weight and improve her skeletal muscle tone, continue her on  the medications Jessica Mcneil is on and just Jessica Mcneil how Jessica Mcneil does.  Jessica Mcneil is very amenable  to that, and wants to that, so we went through some strategies for how to do  that.  Jessica Mcneil would like to followup with Dr. Gala Romney as her primary cardiologist.  Given her situation, I think that is very reasonable, and I think Jessica Mcneil  probably  ought to be seen back in 6 months.                                   Farris Has. Dorethea Clan, MD   JMH/MedQ  DD:  07/29/2006  DT:  07/29/2006  Job #:  [id number]   cc:   Teena Irani. Arlyce Dice, MD

## 2011-05-10 NOTE — Assessment & Plan Note (Signed)
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  Dionicio Stall for nonischemic cardiomyopathy who presents today to  establish long term followup.   PAST MEDICAL HISTORY:  1. Nonischemic cardiomyopathy, resolved.      a.     Cardiac catheterization in February 2007, showed an EF of       50% and normal coronary arteries.      b.     MUGA at that time in February 2007 showed an EF of 39%.      c.     Echo in February 2007 showed an EF of 35-40%.      d.     A most recent echocardiogram August 2007 showed normal LV       function.      e.     Cardiopulmonary exercise testing April 2007, peak VO2 was       20.6, which was 105% of predicted with a slope of 42.7.  Peak RER       is 1.09.  There was a mild restriction on her spirometry.  2. Hypertension.  3. Hyperlipidemia.  4. Obesity.  5. Gastroesophageal reflux disease.   CURRENT MEDICATIONS:  1. Lisinopril 5 mg a day.  2. Metoprolol 25 a day.  3. Simvastatin 40 a day.  4. Xanax 0.5 mg p.r.n.  5. Aspirin 80 mg a day.  6. Omega 3 fatty acids.  7. Calcium.  8. Boniva.  9. Multivitamin.  10.Prilosec p.r.n.   ALLERGIES:  CODEINE.   INTERVAL HISTORY:  Jessica Mcneil returns today to establish long term  care, she was previously followed by Dr. Dorethea Clan.  She has a history of  nonischemic cardiomyopathy which is resolved.  She says though she still  gets quite fatigued during the day and only works 6 hours days.  She is  also short of breath up stairs.  Denies any chest pain, does have mild  lower extremity edema.  No orthopnea or PND.  She does say she snores  quite a bit.  She is asking today for a  handicap parking sticker.   PHYSICAL EXAMINATION:  She is in no acute distress, ambulates around the  clinic without any respiratory difficulty.  Blood pressure is 106/65.  Heart rate 66, weight is 186.  HEENT:  Sclerae anicteric, EOMI, there is no xanthelasmas, mucus  membranes are moist, neck is supple, there is no lymphadenopathy or  thyromegaly.  JVDs about 7-cm of water.  Carotids are 2+ bilaterally, no  bruits.  CARDIAC:  Regular rate and rhythm, no murmurs, rubs, or gallops.  LUNGS:  Clear.  ABDOMEN:  Obese, nontender, nondistended. No hepatosplenomegaly, no  bruits, no mass.  EXTREMITIES:  Warm with no cyanosis, clubbing.  There is 1+ edema  bilaterally.  No rashes.  NEURO:  She is alert and oriented x3, cranial nerves II-XII are intact,  moves all 4 extremities without any difficulty.  Affect is bright.   ASSESSMENT/PLAN:  1. Nonischemic cardiomyopathy, this is resolved.  If at all possible I      would like to titrate up her lisinopril and Toprol, however her      blood pressure sort of prohibits this. She does have mild lower      extremity edema today and we will add hydrochlorothiazide 12.5 a      day.  She will watch her potassium at work.  She does also note      that she does have a cough, I am not sure if it is related to her      ACE inhibitor or reflux disease.  I did tell her to try and take      Prilosec everyday instead of p.r.n. to see is this helps.  2. Fatigue.  I suspect she has component of sleep apnea and we will      send her for a sleep study.  I have also encouraged her to get more      regular exercise.   DISPOSITION:  She will return to clinic in 3 months, we will check her  lipids at that time and attempt to titrate her medications for her  previous myopathy.     Bevelyn Buckles. Bensimhon, MD  Electronically Signed    DRB/MedQ  DD: 12/19/2006  DT: 12/19/2006  Job #: [id number]

## 2011-05-14 ENCOUNTER — Other Ambulatory Visit: Payer: Self-pay | Admitting: Internal Medicine

## 2011-06-05 ENCOUNTER — Encounter: Payer: Self-pay | Admitting: Family Medicine

## 2011-06-05 ENCOUNTER — Ambulatory Visit (INDEPENDENT_AMBULATORY_CARE_PROVIDER_SITE_OTHER): Payer: Medicare Other | Admitting: Family Medicine

## 2011-06-05 DIAGNOSIS — I428 Other cardiomyopathies: Secondary | ICD-10-CM

## 2011-06-05 DIAGNOSIS — E785 Hyperlipidemia, unspecified: Secondary | ICD-10-CM

## 2011-06-05 DIAGNOSIS — T502X1A Poisoning by carbonic-anhydrase inhibitors, benzothiadiazides and other diuretics, accidental (unintentional), initial encounter: Secondary | ICD-10-CM

## 2011-06-05 DIAGNOSIS — F329 Major depressive disorder, single episode, unspecified: Secondary | ICD-10-CM

## 2011-06-05 DIAGNOSIS — F3289 Other specified depressive episodes: Secondary | ICD-10-CM

## 2011-06-05 LAB — LIPID PANEL: Triglycerides: 157 mg/dL — ABNORMAL HIGH (ref 0.0–149.0)

## 2011-06-05 LAB — LDL CHOLESTEROL, DIRECT: Direct LDL: 216.7 mg/dL

## 2011-06-05 LAB — BASIC METABOLIC PANEL
CO2: 32 mEq/L (ref 19–32)
Calcium: 9 mg/dL (ref 8.4–10.5)
Creatinine, Ser: 0.8 mg/dL (ref 0.4–1.2)
GFR: 72.56 mL/min (ref >60.00)

## 2011-06-05 LAB — HEPATIC FUNCTION PANEL
ALT: 22 U/L (ref 0–35)
AST: 23 U/L (ref 0–37)
Bilirubin, Direct: 0.1 mg/dL (ref 0.0–0.3)
Total Bilirubin: 0.6 mg/dL (ref 0.3–1.2)
Total Protein: 7.5 g/dL (ref 6.0–8.3)

## 2011-06-05 MED ORDER — FLUOXETINE HCL 20 MG PO CAPS: ORAL_CAPSULE | ORAL | Status: DC

## 2011-06-05 NOTE — Progress Notes (Signed)
  Subjective:    Patient ID: Jessica Mcneil, female    DOB: 12/26/1940, 71 y.o.   MRN: [id number]  HPI Patient seen for medical followup. She has history of primary dilated cardiomyopathy. Recent echo revealed excellent ejection fraction. Symptomatically stable as she has some chronic shortness of breath. She is not walking consistently for exercise. Other medical problems include hyperlipidemia and depression.  No lab work in over one year. She's not been taking her Lipitor consistently secondary to cost.  Depression stable. She still has some depressive symptoms and queries increasing Prozac to 2 daily. Increased situational stressors with finances.   Review of Systems  Constitutional: Negative for appetite change and unexpected weight change.  Eyes: Negative for visual disturbance.  Respiratory: Positive for shortness of breath. Negative for cough and wheezing.   Cardiovascular: Negative for chest pain, palpitations and leg swelling.  Gastrointestinal: Negative for abdominal pain.  Genitourinary: Negative for dysuria.  Neurological: Negative for headaches.  Psychiatric/Behavioral: Positive for dysphoric mood. Negative for agitation.       Objective:   Physical Exam  Constitutional: She is oriented to person, place, and time. She appears well-developed and well-nourished.  HENT:  Right Ear: External ear normal.  Left Ear: External ear normal.  Mouth/Throat: Oropharynx is clear and moist.  Neck: Neck supple. No thyromegaly present.  Cardiovascular: Normal rate and regular rhythm.   Pulmonary/Chest: Effort normal and breath sounds normal. No respiratory distress. She has no wheezes. She has no rales.  Musculoskeletal: She exhibits no edema.  Neurological: She is alert and oriented to person, place, and time.  Psychiatric: She has a normal mood and affect. Her behavior is normal.          Assessment & Plan:  #1 depression. Still some intermittent symptoms. Titrate fluoxetine  20 mg 2 caps daily. #2 history of primary dilated cardiomyopathy which is stable  #3 hyperlipidemia recheck lipid and hepatic panel #4 diuretic use. Check basic metabolic panel

## 2011-06-06 ENCOUNTER — Other Ambulatory Visit: Payer: Self-pay | Admitting: *Deleted

## 2011-06-06 MED ORDER — ATORVASTATIN CALCIUM 40 MG PO TABS: 40.0000 mg | ORAL_TABLET | Freq: Every day | ORAL | Status: DC

## 2011-06-06 NOTE — Progress Notes (Signed)
Quick Note:  Pt informed and she voiced her understanding, will refill Lipitor at her pharmacy ______

## 2011-06-12 ENCOUNTER — Other Ambulatory Visit: Payer: Self-pay

## 2011-06-12 NOTE — Telephone Encounter (Signed)
[facility] acetaminophen 5-325mg  last seen in office by dr. Caryl Never 06/05/11 - last written 08/2010 #30 5AO - please advise

## 2011-06-13 MED ORDER — HYDROCODONE-ACETAMINOPHEN 5-325 MG PO TABS: 1.0000 | ORAL_TABLET | Freq: Four times a day (QID) | ORAL | Status: DC | PRN

## 2011-06-13 NOTE — Telephone Encounter (Signed)
30

## 2011-06-13 NOTE — Telephone Encounter (Signed)
[facility] to [facility] 

## 2011-09-04 ENCOUNTER — Emergency Department (HOSPITAL_COMMUNITY): Payer: Medicare Other

## 2011-09-04 ENCOUNTER — Emergency Department (HOSPITAL_COMMUNITY)
Admission: EM | Admit: 2011-09-04 | Discharge: 2011-09-04 | Disposition: A | Discharge status: Home / Self Care | Payer: Medicare Other | Attending: Emergency Medicine | Admitting: Emergency Medicine

## 2011-09-04 DIAGNOSIS — S0990XA Unspecified injury of head, initial encounter: Secondary | ICD-10-CM | POA: Insufficient documentation

## 2011-09-04 DIAGNOSIS — R51 Headache: Secondary | ICD-10-CM | POA: Insufficient documentation

## 2011-09-04 DIAGNOSIS — T148XXA Other injury of unspecified body region, initial encounter: Secondary | ICD-10-CM | POA: Insufficient documentation

## 2011-09-04 DIAGNOSIS — E7800 Pure hypercholesterolemia, unspecified: Secondary | ICD-10-CM | POA: Insufficient documentation

## 2011-09-04 DIAGNOSIS — W101XXA Fall (on)(from) sidewalk curb, initial encounter: Secondary | ICD-10-CM | POA: Insufficient documentation

## 2011-09-04 DIAGNOSIS — I10 Essential (primary) hypertension: Secondary | ICD-10-CM | POA: Insufficient documentation

## 2011-09-04 DIAGNOSIS — R079 Chest pain, unspecified: Secondary | ICD-10-CM | POA: Insufficient documentation

## 2011-11-14 ENCOUNTER — Other Ambulatory Visit: Payer: Self-pay | Admitting: Internal Medicine

## 2011-11-15 ENCOUNTER — Other Ambulatory Visit: Payer: Self-pay | Admitting: Family Medicine

## 2011-11-15 NOTE — Telephone Encounter (Signed)
Refill for 6 months. 

## 2011-11-15 NOTE — Telephone Encounter (Signed)
Pt last seen 06/05/11.  Pls advise.

## 2011-12-31 DIAGNOSIS — Z23 Encounter for immunization: Secondary | ICD-10-CM | POA: Diagnosis not present

## 2012-02-05 DIAGNOSIS — M5412 Radiculopathy, cervical region: Secondary | ICD-10-CM | POA: Diagnosis not present

## 2012-02-10 ENCOUNTER — Other Ambulatory Visit: Payer: Self-pay | Admitting: Internal Medicine

## 2012-02-11 DIAGNOSIS — M5030 Other cervical disc degeneration, unspecified cervical region: Secondary | ICD-10-CM | POA: Diagnosis not present

## 2012-02-18 DIAGNOSIS — M5030 Other cervical disc degeneration, unspecified cervical region: Secondary | ICD-10-CM | POA: Diagnosis not present

## 2012-02-18 DIAGNOSIS — M5412 Radiculopathy, cervical region: Secondary | ICD-10-CM | POA: Diagnosis not present

## 2012-03-27 DIAGNOSIS — M5030 Other cervical disc degeneration, unspecified cervical region: Secondary | ICD-10-CM | POA: Diagnosis not present

## 2012-03-27 DIAGNOSIS — M5412 Radiculopathy, cervical region: Secondary | ICD-10-CM | POA: Diagnosis not present

## 2012-04-14 ENCOUNTER — Other Ambulatory Visit: Payer: Self-pay | Admitting: *Deleted

## 2012-04-14 MED ORDER — FLUOXETINE HCL 20 MG PO CAPS: ORAL_CAPSULE | ORAL | Status: DC

## 2012-04-16 DIAGNOSIS — M5412 Radiculopathy, cervical region: Secondary | ICD-10-CM | POA: Diagnosis not present

## 2012-04-16 DIAGNOSIS — M5030 Other cervical disc degeneration, unspecified cervical region: Secondary | ICD-10-CM | POA: Diagnosis not present

## 2012-04-20 DIAGNOSIS — H43819 Vitreous degeneration, unspecified eye: Secondary | ICD-10-CM | POA: Diagnosis not present

## 2012-04-20 DIAGNOSIS — H401490 Capsular glaucoma with pseudoexfoliation of lens, unspecified eye, stage unspecified: Secondary | ICD-10-CM | POA: Diagnosis not present

## 2012-04-20 DIAGNOSIS — H409 Unspecified glaucoma: Secondary | ICD-10-CM | POA: Diagnosis not present

## 2012-04-21 ENCOUNTER — Other Ambulatory Visit: Payer: Self-pay

## 2012-04-21 MED ORDER — METOPROLOL SUCCINATE ER 50 MG PO TB24: 50.0000 mg | ORAL_TABLET | Freq: Every day | ORAL | Status: DC

## 2012-04-22 DIAGNOSIS — M5412 Radiculopathy, cervical region: Secondary | ICD-10-CM | POA: Diagnosis not present

## 2012-04-22 DIAGNOSIS — M5030 Other cervical disc degeneration, unspecified cervical region: Secondary | ICD-10-CM | POA: Diagnosis not present

## 2012-04-24 DIAGNOSIS — M5412 Radiculopathy, cervical region: Secondary | ICD-10-CM | POA: Diagnosis not present

## 2012-04-24 DIAGNOSIS — M5030 Other cervical disc degeneration, unspecified cervical region: Secondary | ICD-10-CM | POA: Diagnosis not present

## 2012-05-05 ENCOUNTER — Telehealth: Payer: Self-pay | Admitting: Internal Medicine

## 2012-05-05 NOTE — Telephone Encounter (Signed)
New msg Pt wants to know if she is to see Dr Gala Romney and transfer to another Dr

## 2012-05-05 NOTE — Telephone Encounter (Signed)
Spoke with pt, per dr bensimhon he would like to see the pt at the hosp. Number given to pt for her to make an appt.

## 2012-05-14 ENCOUNTER — Ambulatory Visit (HOSPITAL_COMMUNITY)
Admission: RE | Admit: 2012-05-14 | Discharge: 2012-05-14 | Disposition: A | Discharge status: Home / Self Care | Payer: Medicare Other | Source: Ambulatory Visit | Attending: Internal Medicine | Admitting: Internal Medicine

## 2012-05-14 ENCOUNTER — Encounter (HOSPITAL_COMMUNITY): Payer: Self-pay

## 2012-05-14 VITALS — BP 90/66 | HR 64 | Ht 67.0 in | Wt 177.8 lb

## 2012-05-14 DIAGNOSIS — I509 Heart failure, unspecified: Secondary | ICD-10-CM | POA: Diagnosis not present

## 2012-05-14 DIAGNOSIS — M5030 Other cervical disc degeneration, unspecified cervical region: Secondary | ICD-10-CM | POA: Diagnosis not present

## 2012-05-14 DIAGNOSIS — I428 Other cardiomyopathies: Secondary | ICD-10-CM | POA: Diagnosis not present

## 2012-05-14 DIAGNOSIS — M5412 Radiculopathy, cervical region: Secondary | ICD-10-CM | POA: Diagnosis not present

## 2012-05-14 NOTE — Assessment & Plan Note (Signed)
EF remains preserved. BP a little low but asymptomatic. Will stop lisinopril. F/u 1 year.

## 2012-05-14 NOTE — Progress Notes (Signed)
Encounter addended by: Noralee Space, RN on: 05/14/2012  3:13 PM<BR>     Documentation filed: Patient Instructions Section

## 2012-05-14 NOTE — Progress Notes (Signed)
Patient ID: Jessica Mcneil, female   DOB: 1941/05/16, 71 y.o.   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 Most recent ejection fraction in 8/09 was 55-60%. She returns for routine followup.  1. 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   February 19, 2006 cardiac catheterization revealing normal            coronary arteries with an EF of 50%. EF 35-40% by ECHO.      b.   Echo 2008: EF 40-50% with mild diffuse            LV hypokinesis and abnormal left ventricular relaxation.  Mild AI.      c.   Echo 8/09 EF 55-60%. Trivial AI     d.   CPX 4/07, pVO2: 20.6 (105% predicted) slope of 42.7.  pRER 1.09.            There was a mild restriction on her spirometry.     E. Echo 4/12 EF 60-65% grade 1 DD. Mild MR/AI  She is doing very well. Occasional DOE but she feels it is improved. No longer bowling.  No angina. Occasional edema resolves with HCTZ. No orthopnea/PND.  No dizziness.   ROS: All systems negative except as listed in HPI, PMH and Problem List.  Past Medical History  Diagnosis Date  . CHF (congestive heart failure)     due to non ischemic cardiomyopathy  . HLD (hyperlipidemia)   . HTN (hypertension)   . Overweight   . GERD (gastroesophageal reflux disease)   . Depression     Current Outpatient Prescriptions  Medication Sig Dispense Refill  . ALPRAZolam (XANAX) 0.5 MG tablet Take 0.5 mg by mouth at bedtime as needed.        [name] aspirin 81 MG tablet Take 81 mg by mouth daily.        [name] atorvastatin (LIPITOR) 40 MG tablet Take 1 tablet (40 mg total) by mouth daily.  90 tablet  2  . AZOPT 1 % ophthalmic suspension Place 1 drop into both eyes daily.       . Calcium Carbonate-Vit D-Min 600-400 MG-UNIT TABS Take by mouth 2 (two) times daily.        . COMBIGAN 0.2-0.5 % ophthalmic solution Place 1 drop into the left eye 2 (two) times daily.       [name] FLUoxetine (PROZAC) 20 MG capsule  Take 20 mg by mouth as directed. Pt states she takes 40 mg and alternates with 20 mg every other day.  Pt will need return OV in June.  Please inform pt and thank you.      . hydrochlorothiazide (MICROZIDE) 12.5 MG capsule TAKE 1 CAPSULE EVERY MORNING  90 capsule  0  . HYDROcodone-acetaminophen (NORCO) 5-325 MG per tablet Take 1 tablet by mouth every 6 (six) hours as needed.  30 tablet  0  . lisinopril (PRINIVIL,ZESTRIL) 5 MG tablet Take 5 mg by mouth daily.        . metoprolol succinate (TOPROL-XL) 50 MG 24 hr tablet Take 1 tablet (50 mg total) by mouth daily. Take with or immediately following a meal.  30 tablet  0  . Multiple Vitamin (MULTIVITAMIN) tablet Take 1 tablet by mouth daily.        . Omega-3 Fatty Acids (FISH OIL) 600 MG CAPS Take 1,800 mg by mouth daily.      [name] omeprazole (  PRILOSEC) 20 MG capsule Take 20 mg by mouth daily.        . travoprost, benzalkonium, (TRAVATAN) 0.004 % ophthalmic solution Place 1 drop into the left eye at bedtime.        [name] DISCONTD: FLUoxetine (PROZAC) 20 MG capsule Take 2 tabs by mouth daily.  Pt will need return OV in June.  Please inform pt and thank you.  60 capsule  2     PHYSICAL EXAM: Filed Vitals:   05/14/12 1450  BP: 90/66  Pulse: 64  BP checked with Doppler General:  Well appearing. No resp difficulty HEENT: normal Neck: supple. JVP 6 Carotids 2+ bilaterally; no bruits. No lymphadenopathy or thryomegaly appreciated. Cor: PMI normal. Regular rate & rhythm. No rubs, murmurs. +s4 Lungs: clear Abdomen: obese soft, nontender, nondistended. No hepatosplenomegaly. No bruits or masses. Good bowel sounds. Extremities: no cyanosis, clubbing, rash, edema Neuro: alert & orientedx3, cranial nerves grossly intact. Moves all 4 extremities w/o difficulty. Affect pleasant.   ASSESSMENT & PLAN:

## 2012-05-14 NOTE — Patient Instructions (Signed)
Stop Lisinopril  We will contact you in 1 year to schedule your next appointment.

## 2012-05-20 ENCOUNTER — Other Ambulatory Visit: Payer: Self-pay | Admitting: Internal Medicine

## 2012-05-20 MED ORDER — HYDROCHLOROTHIAZIDE 12.5 MG PO CAPS: 12.5000 mg | ORAL_CAPSULE | ORAL | Status: DC

## 2012-05-22 ENCOUNTER — Telehealth (HOSPITAL_COMMUNITY): Payer: Self-pay | Admitting: *Deleted

## 2012-05-22 MED ORDER — METOPROLOL SUCCINATE ER 50 MG PO TB24: 50.0000 mg | ORAL_TABLET | Freq: Every day | ORAL | Status: DC

## 2012-05-22 NOTE — Telephone Encounter (Signed)
Pt aware new rx was sent in

## 2012-05-22 NOTE — Telephone Encounter (Signed)
Jessica Mcneil called today, she is having a problem getting her metoprolol refilled at [facility]. Please call them and re fax the order. Thanks.

## 2012-05-23 ENCOUNTER — Other Ambulatory Visit: Payer: Self-pay

## 2012-05-23 MED ORDER — METOPROLOL SUCCINATE ER 50 MG PO TB24: 50.0000 mg | ORAL_TABLET | Freq: Every day | ORAL | Status: DC

## 2012-07-15 DIAGNOSIS — M19019 Primary osteoarthritis, unspecified shoulder: Secondary | ICD-10-CM | POA: Diagnosis not present

## 2012-07-23 DIAGNOSIS — H409 Unspecified glaucoma: Secondary | ICD-10-CM | POA: Diagnosis not present

## 2012-07-23 DIAGNOSIS — H401490 Capsular glaucoma with pseudoexfoliation of lens, unspecified eye, stage unspecified: Secondary | ICD-10-CM | POA: Diagnosis not present

## 2012-07-23 DIAGNOSIS — H43819 Vitreous degeneration, unspecified eye: Secondary | ICD-10-CM | POA: Diagnosis not present

## 2012-08-06 DIAGNOSIS — H401490 Capsular glaucoma with pseudoexfoliation of lens, unspecified eye, stage unspecified: Secondary | ICD-10-CM | POA: Diagnosis not present

## 2012-08-06 DIAGNOSIS — H409 Unspecified glaucoma: Secondary | ICD-10-CM | POA: Diagnosis not present

## 2012-08-06 DIAGNOSIS — H43819 Vitreous degeneration, unspecified eye: Secondary | ICD-10-CM | POA: Diagnosis not present

## 2012-08-31 DIAGNOSIS — N362 Urethral caruncle: Secondary | ICD-10-CM | POA: Diagnosis not present

## 2012-08-31 DIAGNOSIS — N3946 Mixed incontinence: Secondary | ICD-10-CM | POA: Diagnosis not present

## 2012-09-18 DIAGNOSIS — H4011X0 Primary open-angle glaucoma, stage unspecified: Secondary | ICD-10-CM | POA: Diagnosis not present

## 2012-09-18 DIAGNOSIS — H43819 Vitreous degeneration, unspecified eye: Secondary | ICD-10-CM | POA: Diagnosis not present

## 2012-09-18 DIAGNOSIS — H409 Unspecified glaucoma: Secondary | ICD-10-CM | POA: Diagnosis not present

## 2012-10-13 DIAGNOSIS — N3946 Mixed incontinence: Secondary | ICD-10-CM | POA: Diagnosis not present

## 2012-10-21 DIAGNOSIS — N3946 Mixed incontinence: Secondary | ICD-10-CM | POA: Diagnosis not present

## 2012-10-27 ENCOUNTER — Other Ambulatory Visit: Payer: Self-pay | Admitting: Family Medicine

## 2012-10-28 NOTE — Telephone Encounter (Signed)
Pt needs return OV before any more refills

## 2012-11-03 ENCOUNTER — Other Ambulatory Visit: Payer: Self-pay | Admitting: Family Medicine

## 2012-11-03 DIAGNOSIS — Z1231 Encounter for screening mammogram for malignant neoplasm of breast: Secondary | ICD-10-CM

## 2012-11-03 DIAGNOSIS — Z23 Encounter for immunization: Secondary | ICD-10-CM | POA: Diagnosis not present

## 2012-11-04 ENCOUNTER — Other Ambulatory Visit: Payer: Self-pay | Admitting: Family Medicine

## 2012-11-16 ENCOUNTER — Encounter (HOSPITAL_COMMUNITY): Payer: Self-pay | Admitting: Emergency Medicine

## 2012-11-16 ENCOUNTER — Emergency Department (INDEPENDENT_AMBULATORY_CARE_PROVIDER_SITE_OTHER)
Admission: EM | Admit: 2012-11-16 | Discharge: 2012-11-16 | Disposition: A | Discharge status: Home / Self Care | Payer: Medicare Other | Source: Home / Self Care

## 2012-11-16 DIAGNOSIS — J069 Acute upper respiratory infection, unspecified: Secondary | ICD-10-CM

## 2012-11-16 LAB — POCT RAPID STREP A: Streptococcus, Group A Screen (Direct): NEGATIVE

## 2012-11-16 NOTE — ED Notes (Signed)
Pt c/o cold sx x1 week... Sx include: dry cough, chest congsetion, facial pressure, sore throat... Denies: fevers, vomiting, nauseas, diarrhea... Taking mucinex and aleve for the discomfort... Pt is alert w/no signs of distress.

## 2012-11-16 NOTE — ED Provider Notes (Signed)
History     CSN: [id number]  Arrival date & time 11/16/12  4098   None     Chief Complaint  Patient presents with  . URI    (Consider location/radiation/quality/duration/timing/severity/associated sxs/prior treatment) HPI Comments: 71 year old female presents with URI symptoms. She is having nasal congestion and chest congestion associated with cough, PND and previous episodic sore throat. She is not having a sore throat now. She denies earache, fever or chills. She has been using Mucinex and Aleve without significant improvement in symptoms.   Past Medical History  Diagnosis Date  . CHF (congestive heart failure)     due to non ischemic cardiomyopathy  . HLD (hyperlipidemia)   . HTN (hypertension)   . Overweight   . GERD (gastroesophageal reflux disease)   . Depression     Past Surgical History  Procedure Date  . Cholecystectomy   . Vaginal hysterectomy 1998    Family History  Problem Relation Age of Onset  . Heart disease Father   . Arthritis    . Hyperlipidemia      History  Substance Use Topics  . Smoking status: Former Smoker -- 2.0 packs/day    Quit date: 12/23/1982  . Smokeless tobacco: Not on file  . Alcohol Use: Not on file    OB History    Grav Para Term Preterm Abortions TAB SAB Ect Mult Living                  Review of Systems  Constitutional: Negative for fever, chills, activity change, appetite change and fatigue.  HENT: Positive for congestion, rhinorrhea, voice change, postnasal drip and sinus pressure. Negative for sore throat, facial swelling, trouble swallowing, neck pain and neck stiffness.   Eyes: Negative.   Respiratory: Negative.   Cardiovascular: Negative.   Gastrointestinal: Negative.   Musculoskeletal: Negative.   Skin: Negative for pallor and rash.  Neurological: Negative.     Allergies  Codeine and Erythromycin  Home Medications   Current Outpatient Rx  Name  Route  Sig  Dispense  Refill  . ASPIRIN 81 MG PO  TABS   Oral   Take 81 mg by mouth daily.           . AZOPT 1 % OP SUSP   Both Eyes   Place 1 drop into both eyes daily.          [name] CALCIUM CARBONATE-VIT D-MIN 600-400 MG-UNIT PO TABS   Oral   Take by mouth 2 (two) times daily.           . COMBIGAN 0.2-0.5 % OP SOLN   Left Eye   Place 1 drop into the left eye 2 (two) times daily.          [name] HYDROCHLOROTHIAZIDE 12.5 MG PO CAPS   Oral   Take 1 capsule (12.5 mg total) by mouth every morning.   90 capsule   3   . HYDROCODONE-ACETAMINOPHEN 5-325 MG PO TABS   Oral   Take 1 tablet by mouth every 6 (six) hours as needed.   30 tablet   0   . METOPROLOL SUCCINATE ER 50 MG PO TB24   Oral   Take 1 tablet (50 mg total) by mouth daily. Take with or immediately following a meal.   30 tablet   12   . ONE-DAILY MULTI VITAMINS PO TABS   Oral   Take 1 tablet by mouth daily.           [name] FISH  OIL 600 MG PO CAPS   Oral   Take 1,800 mg by mouth daily.         [name] OMEPRAZOLE 20 MG PO CPDR   Oral   Take 20 mg by mouth daily.           [name] ALPRAZOLAM 0.5 MG PO TABS   Oral   Take 0.5 mg by mouth at bedtime as needed.           . ATORVASTATIN CALCIUM 40 MG PO TABS   Oral   Take 1 tablet (40 mg total) by mouth daily.   90 tablet   2   . FLUOXETINE HCL 20 MG PO CAPS   Oral   Take 20 mg by mouth as directed. Pt states she takes 40 mg and alternates with 20 mg every other day.  Pt will need return OV in June.  Please inform pt and thank you.         [name] FLUOXETINE HCL 20 MG PO CAPS      TAKE 2 CAPSULESS BY MOUTH DAILY. PT WILL NEED RETURN OV IN [place]. PLEASE INFORM PT AND THANK YOU.   1 capsule   0     Refill denied, pt needs OV   . FLUOXETINE HCL 20 MG PO CAPS      Take 2 capsules by mouth daily   60 capsule   5   . TRAVOPROST 0.004 % OP SOLN   Left Eye   Place 1 drop into the left eye at bedtime.             BP 111/58  Pulse 77  Temp 97.8 F (36.6 C) (Oral)  Resp 20  SpO2 97%  Physical Exam   Constitutional: She is oriented to person, place, and time. She appears well-developed and well-nourished. No distress.  HENT:  Mouth/Throat: No oropharyngeal exudate.       Bilateral TMs are pearly gray, translucent no erythema, effusions or retraction. Oropharynx with red streaks in the posterior pharynx but no swelling or exudates. No evidence of infection.  Eyes: Conjunctivae normal and EOM are normal.  Neck: Normal range of motion. Neck supple.  Cardiovascular: Normal rate and regular rhythm.   Pulmonary/Chest: Effort normal and breath sounds normal. No respiratory distress. She has no wheezes. She has no rales.  Musculoskeletal: Normal range of motion. She exhibits no edema.  Lymphadenopathy:    She has no cervical adenopathy.  Neurological: She is alert and oriented to person, place, and time.  Skin: Skin is warm and dry. No rash noted.  Psychiatric: She has a normal mood and affect.    ED Course  Procedures (including critical care time)   Labs Reviewed  POCT RAPID STREP A ([facility] ONLY)   No results found.   1. URI (upper respiratory infection)       MDM   Results for orders placed during the hospital encounter of 11/16/12  POCT RAPID STREP A ([facility] ONLY)      Component Value Range   Streptococcus, Group A Screen (Direct) NEGATIVE  NEGATIVE   Drink plenty of fluids and stay well hydrated Sudafed PE 10 mg every 4 hours when necessary congestion Allegra or Sudafed when necessary drainage; if this is insufficient negative something stronger such as Chlor-Trimeton 4 mg 1/2-1 tablet every 4 hours when necessary drainage Robitussin-DM as directed if using that stopped Mucinex May continue to use ibuprofen or Tylenol for discomfort.  Hayden Rasmussen, NP 11/16/12 1128

## 2012-11-16 NOTE — ED Provider Notes (Signed)
Medical screening examination/treatment/procedure(s) were performed by non-physician practitioner and as supervising physician I was immediately available for consultation/collaboration.  Raynald Blend, MD 11/16/12 [phone]

## 2012-11-27 DIAGNOSIS — M25519 Pain in unspecified shoulder: Secondary | ICD-10-CM | POA: Diagnosis not present

## 2012-12-01 DIAGNOSIS — M25519 Pain in unspecified shoulder: Secondary | ICD-10-CM | POA: Diagnosis not present

## 2012-12-07 DIAGNOSIS — M25519 Pain in unspecified shoulder: Secondary | ICD-10-CM | POA: Diagnosis not present

## 2012-12-11 DIAGNOSIS — M25519 Pain in unspecified shoulder: Secondary | ICD-10-CM | POA: Diagnosis not present

## 2012-12-14 ENCOUNTER — Ambulatory Visit
Admission: RE | Admit: 2012-12-14 | Discharge: 2012-12-14 | Disposition: A | Discharge status: Home / Self Care | Payer: Medicare Other | Source: Ambulatory Visit | Attending: Family Medicine | Admitting: Family Medicine

## 2012-12-14 DIAGNOSIS — Z1231 Encounter for screening mammogram for malignant neoplasm of breast: Secondary | ICD-10-CM | POA: Diagnosis not present

## 2012-12-25 DIAGNOSIS — M25519 Pain in unspecified shoulder: Secondary | ICD-10-CM | POA: Diagnosis not present

## 2012-12-25 NOTE — Progress Notes (Signed)
Quick Note:  Pt informed on home VM ______ 

## 2012-12-28 ENCOUNTER — Other Ambulatory Visit: Payer: Self-pay | Admitting: Family Medicine

## 2012-12-28 DIAGNOSIS — R928 Other abnormal and inconclusive findings on diagnostic imaging of breast: Secondary | ICD-10-CM

## 2012-12-29 DIAGNOSIS — M25519 Pain in unspecified shoulder: Secondary | ICD-10-CM | POA: Diagnosis not present

## 2013-01-01 DIAGNOSIS — M25519 Pain in unspecified shoulder: Secondary | ICD-10-CM | POA: Diagnosis not present

## 2013-01-08 DIAGNOSIS — M25519 Pain in unspecified shoulder: Secondary | ICD-10-CM | POA: Diagnosis not present

## 2013-01-13 DIAGNOSIS — M25519 Pain in unspecified shoulder: Secondary | ICD-10-CM | POA: Diagnosis not present

## 2013-01-14 ENCOUNTER — Ambulatory Visit (INDEPENDENT_AMBULATORY_CARE_PROVIDER_SITE_OTHER): Payer: Medicare Other | Admitting: Family Medicine

## 2013-01-14 ENCOUNTER — Encounter: Payer: Self-pay | Admitting: Family Medicine

## 2013-01-14 VITALS — BP 110/72 | Temp 98.4°F | Wt 190.0 lb

## 2013-01-14 DIAGNOSIS — J069 Acute upper respiratory infection, unspecified: Secondary | ICD-10-CM | POA: Diagnosis not present

## 2013-01-14 MED ORDER — FLUOXETINE HCL 20 MG PO TABS: 20.0000 mg | ORAL_TABLET | Freq: Every day | ORAL | Status: DC

## 2013-01-14 MED ORDER — HYDROCODONE-HOMATROPINE 5-1.5 MG/5ML PO SYRP: 5.0000 mL | ORAL_SOLUTION | Freq: Four times a day (QID) | ORAL | Status: AC | PRN

## 2013-01-14 NOTE — Patient Instructions (Addendum)

## 2013-01-14 NOTE — Progress Notes (Signed)
  Subjective:    Patient ID: Jessica Mcneil, female    DOB: [birth year], 72 y.o.   MRN: [id number]  HPI Acute visit 3 day history of mostly dry cough No fever.  Mild nasal congestion. No sore throat. Used Mucinex DM without relief. No wheezing. Nonsmoker   Review of Systems  Constitutional: Negative for fever and chills.  HENT: Positive for congestion. Negative for sore throat, voice change and sinus pressure.   Respiratory: Positive for cough.   Neurological: Negative for headaches.       Objective:   Physical Exam  Constitutional: She appears well-developed and well-nourished.  HENT:  Right Ear: External ear normal.  Left Ear: External ear normal.  Mouth/Throat: Oropharynx is clear and moist.  Neck: Neck supple.  Cardiovascular: Normal rate and regular rhythm.   Pulmonary/Chest: Effort normal and breath sounds normal. No respiratory distress. She has no wheezes. She has no rales.  Lymphadenopathy:    She has no cervical adenopathy.          Assessment & Plan:  Viral upper respiratory infection with cough. Hycodan cough syrup for nighttime use as needed. Followup promptly for fever or worsening symptoms

## 2013-01-18 ENCOUNTER — Ambulatory Visit
Admission: RE | Admit: 2013-01-18 | Discharge: 2013-01-18 | Disposition: A | Discharge status: Home / Self Care | Payer: Medicare Other | Source: Ambulatory Visit | Attending: Family Medicine | Admitting: Family Medicine

## 2013-01-18 ENCOUNTER — Other Ambulatory Visit: Payer: Self-pay | Admitting: Family Medicine

## 2013-01-18 DIAGNOSIS — D249 Benign neoplasm of unspecified breast: Secondary | ICD-10-CM | POA: Diagnosis not present

## 2013-01-18 DIAGNOSIS — R928 Other abnormal and inconclusive findings on diagnostic imaging of breast: Secondary | ICD-10-CM

## 2013-01-18 DIAGNOSIS — N6019 Diffuse cystic mastopathy of unspecified breast: Secondary | ICD-10-CM | POA: Diagnosis not present

## 2013-01-18 DIAGNOSIS — N63 Unspecified lump in unspecified breast: Secondary | ICD-10-CM | POA: Diagnosis not present

## 2013-01-18 DIAGNOSIS — N6089 Other benign mammary dysplasias of unspecified breast: Secondary | ICD-10-CM | POA: Diagnosis not present

## 2013-01-19 ENCOUNTER — Ambulatory Visit
Admission: RE | Admit: 2013-01-19 | Discharge: 2013-01-19 | Disposition: A | Discharge status: Home / Self Care | Payer: Medicare Other | Source: Ambulatory Visit | Attending: Family Medicine | Admitting: Family Medicine

## 2013-01-19 DIAGNOSIS — R928 Other abnormal and inconclusive findings on diagnostic imaging of breast: Secondary | ICD-10-CM

## 2013-01-19 DIAGNOSIS — N6089 Other benign mammary dysplasias of unspecified breast: Secondary | ICD-10-CM | POA: Diagnosis not present

## 2013-01-19 DIAGNOSIS — N6019 Diffuse cystic mastopathy of unspecified breast: Secondary | ICD-10-CM | POA: Diagnosis not present

## 2013-01-21 DIAGNOSIS — M25519 Pain in unspecified shoulder: Secondary | ICD-10-CM | POA: Diagnosis not present

## 2013-01-25 ENCOUNTER — Ambulatory Visit (INDEPENDENT_AMBULATORY_CARE_PROVIDER_SITE_OTHER): Payer: Medicare Other | Admitting: General Surgery

## 2013-01-25 ENCOUNTER — Encounter (INDEPENDENT_AMBULATORY_CARE_PROVIDER_SITE_OTHER): Payer: Self-pay | Admitting: General Surgery

## 2013-01-25 VITALS — BP 138/82 | HR 68 | Temp 98.2°F | Resp 18 | Ht 66.5 in | Wt 184.0 lb

## 2013-01-25 DIAGNOSIS — R920 Mammographic microcalcification found on diagnostic imaging of breast: Secondary | ICD-10-CM

## 2013-01-25 DIAGNOSIS — M25519 Pain in unspecified shoulder: Secondary | ICD-10-CM | POA: Diagnosis not present

## 2013-01-25 NOTE — Progress Notes (Signed)
Subjective:     Patient ID: Jessica Mcneil, female   DOB: 02/15/1941, 71 y.o.   MRN: [id number]  HPI We're asked to see the patient in consultation by Dr. Burchette to evaluate her for an abnormal mammogram. The patient is a 71-year-old white female who recently went for a routine screening mammogram. At that time she denied any breast pain. She denies any discharge from her nipple. She does occasional self exams and has not felt any masses. She has no personal or family history of breast problems other than some fibrocystic disease. The abnormality was biopsied and came back as fibrocystic disease. The result was felt to be discordant with the mammographic image.  Review of Systems  Constitutional: Negative.   HENT: Negative.   Eyes: Negative.   Respiratory: Negative.   Cardiovascular: Negative.   Gastrointestinal: Negative.   Genitourinary: Negative.   Musculoskeletal: Negative.   Skin: Negative.   Neurological: Negative.   Hematological: Negative.   Psychiatric/Behavioral: Negative.        Objective:   Physical Exam  Constitutional: She is oriented to person, place, and time. She appears well-developed and well-nourished.  HENT:  Head: Normocephalic and atraumatic.  Eyes: Conjunctivae normal and EOM are normal. Pupils are equal, round, and reactive to light.  Neck: Normal range of motion. Neck supple.  Cardiovascular: Normal rate, regular rhythm and normal heart sounds.   Pulmonary/Chest: Effort normal and breath sounds normal.       There is no palpable mass in either breast. There is no palpable axillary supraclavicular cervical lymphadenopathy  Abdominal: Soft. Bowel sounds are normal. She exhibits no mass. There is no tenderness.  Musculoskeletal: Normal range of motion.  Lymphadenopathy:    She has no cervical adenopathy.  Neurological: She is alert and oriented to person, place, and time.  Skin: Skin is warm and dry.  Psychiatric: She has a normal mood and affect. Her  behavior is normal.       Assessment:     The patient has an abnormal mammogram with discordant pathologic findings after core biopsy. Because of this the recommendation is for an open biopsy of this area. I've discussed with her in detail the risks and benefits of the operation as well as some of the technical aspects and she understands and wishes to proceed    Plan:     Plan for right breast wire localized lumpectomy      

## 2013-01-25 NOTE — Patient Instructions (Signed)
Plan for right breast wire localized lumpectomy 

## 2013-01-26 DIAGNOSIS — N3946 Mixed incontinence: Secondary | ICD-10-CM | POA: Diagnosis not present

## 2013-01-27 ENCOUNTER — Encounter (HOSPITAL_COMMUNITY): Payer: Self-pay | Admitting: Pharmacy Technician

## 2013-01-30 NOTE — Pre-Procedure Instructions (Addendum)
[facility]  01/30/2013   Your procedure is scheduled on: 02/05/13  Report to [facility] at 930 AM.when finished at breast center  Call this number if you have problems the morning of surgery: [phone]   Remember:   Do not eat food or drink liquids after midnight.   Take these medicines the morning of surgery with A SIP OF WATER: xanax,,eye drops, prozax,pain med,, prilosec STOP fish oil, multi vit, aspirin now   Do not wear jewelry, make-up or nail polish.  Do not wear lotions, powders, or perfumes. You may wear deodorant.  Do not shave 48 hours prior to surgery. Men may shave face and neck.  Do not bring valuables to the hospital.  Contacts, dentures or bridgework may not be worn into surgery.  Leave suitcase in the car. After surgery it may be brought to your room.  For patients admitted to the hospital, checkout time is 11:00 AM the day of  discharge.   Patients discharged the day of surgery will not be allowed to drive  home.  Name and phone number of your driver:   Special Instructions: Shower using CHG 2 nights before surgery and the night before surgery.  If you shower the day of surgery use CHG.  Use special wash - you have one bottle of CHG for all showers.  You should use approximately 1/3 of the bottle for each shower.   Please read over the following fact sheets that you were given: Pain Booklet, Coughing and Deep Breathing, MRSA Information and Surgical Site Infection Prevention

## 2013-02-01 ENCOUNTER — Encounter (HOSPITAL_COMMUNITY)
Admission: RE | Admit: 2013-02-01 | Discharge: 2013-02-01 | Disposition: A | Discharge status: Home / Self Care | Payer: Medicare Other | Source: Ambulatory Visit | Attending: Anesthesiology | Admitting: Anesthesiology

## 2013-02-01 ENCOUNTER — Encounter (HOSPITAL_COMMUNITY): Payer: Self-pay

## 2013-02-01 ENCOUNTER — Encounter (HOSPITAL_COMMUNITY)
Admission: RE | Admit: 2013-02-01 | Discharge: 2013-02-01 | Disposition: A | Discharge status: Home / Self Care | Payer: Medicare Other | Source: Ambulatory Visit | Attending: General Surgery | Admitting: General Surgery

## 2013-02-01 DIAGNOSIS — I10 Essential (primary) hypertension: Secondary | ICD-10-CM | POA: Diagnosis not present

## 2013-02-01 DIAGNOSIS — I509 Heart failure, unspecified: Secondary | ICD-10-CM | POA: Diagnosis not present

## 2013-02-01 DIAGNOSIS — N641 Fat necrosis of breast: Secondary | ICD-10-CM | POA: Diagnosis not present

## 2013-02-01 DIAGNOSIS — Z01818 Encounter for other preprocedural examination: Secondary | ICD-10-CM | POA: Diagnosis not present

## 2013-02-01 DIAGNOSIS — K219 Gastro-esophageal reflux disease without esophagitis: Secondary | ICD-10-CM | POA: Diagnosis not present

## 2013-02-01 DIAGNOSIS — N6019 Diffuse cystic mastopathy of unspecified breast: Secondary | ICD-10-CM | POA: Diagnosis not present

## 2013-02-01 DIAGNOSIS — N6089 Other benign mammary dysplasias of unspecified breast: Secondary | ICD-10-CM | POA: Diagnosis not present

## 2013-02-01 HISTORY — DX: Other specified postprocedural states: Z98.890

## 2013-02-01 HISTORY — DX: Headache: R51

## 2013-02-01 HISTORY — DX: Shortness of breath: R06.02

## 2013-02-01 HISTORY — DX: Unspecified osteoarthritis, unspecified site: M19.90

## 2013-02-01 HISTORY — DX: Other specified postprocedural states: R11.2

## 2013-02-01 LAB — CBC
MCH: 30.2 pg (ref 26.0–34.0)
MCV: 86.9 fL (ref 78.0–100.0)
Platelets: 312 10*3/uL (ref 150–400)
RBC: 5.1 MIL/uL (ref 3.87–5.11)
RDW: 14 % (ref 11.5–15.5)
WBC: 8.5 10*3/uL (ref 4.0–10.5)

## 2013-02-01 LAB — BASIC METABOLIC PANEL
Calcium: 9.3 mg/dL (ref 8.4–10.5)
Creatinine, Ser: 0.87 mg/dL (ref 0.50–1.10)
GFR calc non Af Amer: 66 mL/min — ABNORMAL LOW (ref >90)
Glucose, Bld: 160 mg/dL — ABNORMAL HIGH (ref 70–99)
Sodium: 139 mEq/L (ref 135–145)

## 2013-02-01 LAB — SURGICAL PCR SCREEN: Staphylococcus aureus: NEGATIVE

## 2013-02-01 NOTE — Progress Notes (Signed)
Echo 12, note dr bensimhon 5/13

## 2013-02-04 MED ORDER — CHLORHEXIDINE GLUCONATE 4 % EX LIQD: 1.0000 "application " | Freq: Once | CUTANEOUS | Status: DC

## 2013-02-04 MED ORDER — CEFAZOLIN SODIUM-DEXTROSE 2-3 GM-% IV SOLR: 2.0000 g | INTRAVENOUS | Status: DC

## 2013-02-05 ENCOUNTER — Ambulatory Visit (HOSPITAL_COMMUNITY): Admission: RE | Admit: 2013-02-05 | Payer: Medicare Other | Source: Ambulatory Visit | Admitting: General Surgery

## 2013-02-05 ENCOUNTER — Encounter (HOSPITAL_COMMUNITY): Admission: RE | Payer: Self-pay | Source: Ambulatory Visit

## 2013-02-05 SURGERY — BREAST LUMPECTOMY WITH NEEDLE LOCALIZATION
Anesthesia: General | Site: Breast | Laterality: Right

## 2013-02-08 ENCOUNTER — Encounter (HOSPITAL_BASED_OUTPATIENT_CLINIC_OR_DEPARTMENT_OTHER): Payer: Self-pay | Admitting: *Deleted

## 2013-02-08 NOTE — Progress Notes (Signed)
Surgery was r/s from 2/13-all labs,cxr,ekg were done Doing well cardiac-

## 2013-02-09 ENCOUNTER — Telehealth (INDEPENDENT_AMBULATORY_CARE_PROVIDER_SITE_OTHER): Payer: Self-pay

## 2013-02-09 NOTE — Telephone Encounter (Signed)
Spoke to pt and gave her po appt for 3/4 @ 10:40.

## 2013-02-12 ENCOUNTER — Other Ambulatory Visit (INDEPENDENT_AMBULATORY_CARE_PROVIDER_SITE_OTHER): Payer: Self-pay | Admitting: General Surgery

## 2013-02-12 ENCOUNTER — Encounter (HOSPITAL_BASED_OUTPATIENT_CLINIC_OR_DEPARTMENT_OTHER): Payer: Self-pay | Admitting: *Deleted

## 2013-02-12 ENCOUNTER — Ambulatory Visit
Admission: RE | Admit: 2013-02-12 | Discharge: 2013-02-12 | Disposition: A | Discharge status: Home / Self Care | Payer: Medicare Other | Source: Ambulatory Visit | Attending: General Surgery | Admitting: General Surgery

## 2013-02-12 ENCOUNTER — Encounter (HOSPITAL_BASED_OUTPATIENT_CLINIC_OR_DEPARTMENT_OTHER)
Admission: RE | Disposition: A | Discharge status: Home / Self Care | Payer: Self-pay | Source: Ambulatory Visit | Attending: General Surgery

## 2013-02-12 ENCOUNTER — Ambulatory Visit (HOSPITAL_BASED_OUTPATIENT_CLINIC_OR_DEPARTMENT_OTHER): Payer: Medicare Other | Admitting: *Deleted

## 2013-02-12 ENCOUNTER — Ambulatory Visit (HOSPITAL_BASED_OUTPATIENT_CLINIC_OR_DEPARTMENT_OTHER)
Admission: RE | Admit: 2013-02-12 | Discharge: 2013-02-12 | Disposition: A | Discharge status: Home / Self Care | Payer: Medicare Other | Source: Ambulatory Visit | Attending: General Surgery | Admitting: General Surgery

## 2013-02-12 DIAGNOSIS — R920 Mammographic microcalcification found on diagnostic imaging of breast: Secondary | ICD-10-CM

## 2013-02-12 DIAGNOSIS — I509 Heart failure, unspecified: Secondary | ICD-10-CM | POA: Insufficient documentation

## 2013-02-12 DIAGNOSIS — N6019 Diffuse cystic mastopathy of unspecified breast: Secondary | ICD-10-CM | POA: Diagnosis not present

## 2013-02-12 DIAGNOSIS — I10 Essential (primary) hypertension: Secondary | ICD-10-CM | POA: Diagnosis not present

## 2013-02-12 DIAGNOSIS — N641 Fat necrosis of breast: Secondary | ICD-10-CM | POA: Insufficient documentation

## 2013-02-12 DIAGNOSIS — N6089 Other benign mammary dysplasias of unspecified breast: Secondary | ICD-10-CM | POA: Insufficient documentation

## 2013-02-12 DIAGNOSIS — K219 Gastro-esophageal reflux disease without esophagitis: Secondary | ICD-10-CM | POA: Insufficient documentation

## 2013-02-12 HISTORY — PX: BREAST LUMPECTOMY WITH NEEDLE LOCALIZATION: SHX5759

## 2013-02-12 SURGERY — BREAST LUMPECTOMY WITH NEEDLE LOCALIZATION
Anesthesia: General | Site: Breast | Laterality: Right | Wound class: Clean

## 2013-02-12 MED ORDER — HYDROMORPHONE HCL PF 1 MG/ML IJ SOLN: 0.2500 mg | INTRAMUSCULAR | Status: DC | PRN

## 2013-02-12 MED ORDER — CEFAZOLIN SODIUM-DEXTROSE 2-3 GM-% IV SOLR
INTRAVENOUS | Status: DC | PRN
  Administered 2013-02-12: 2 g via INTRAVENOUS

## 2013-02-12 MED ORDER — FENTANYL CITRATE 0.05 MG/ML IJ SOLN: 50.0000 ug | INTRAMUSCULAR | Status: DC | PRN

## 2013-02-12 MED ORDER — LACTATED RINGERS IV SOLN
INTRAVENOUS | Status: DC
  Administered 2013-02-12: 14:00:00 via INTRAVENOUS

## 2013-02-12 MED ORDER — ACETAMINOPHEN 10 MG/ML IV SOLN
1000.0000 mg | Freq: Once | INTRAVENOUS | Status: AC
  Administered 2013-02-12: 1000 mg via INTRAVENOUS

## 2013-02-12 MED ORDER — MIDAZOLAM HCL 2 MG/2ML IJ SOLN: 1.0000 mg | INTRAMUSCULAR | Status: DC | PRN

## 2013-02-12 MED ORDER — ONDANSETRON HCL 4 MG/2ML IJ SOLN
INTRAMUSCULAR | Status: DC | PRN
  Administered 2013-02-12: 4 mg via INTRAVENOUS

## 2013-02-12 MED ORDER — DEXAMETHASONE SODIUM PHOSPHATE 4 MG/ML IJ SOLN
INTRAMUSCULAR | Status: DC | PRN
  Administered 2013-02-12: 10 mg via INTRAVENOUS

## 2013-02-12 MED ORDER — METOCLOPRAMIDE HCL 5 MG/ML IJ SOLN
10.0000 mg | Freq: Once | INTRAMUSCULAR | Status: AC | PRN
  Administered 2013-02-12: 10 mg via INTRAVENOUS

## 2013-02-12 MED ORDER — LIDOCAINE HCL (CARDIAC) 20 MG/ML IV SOLN
INTRAVENOUS | Status: DC | PRN
  Administered 2013-02-12: 40 mg via INTRAVENOUS

## 2013-02-12 MED ORDER — BUPIVACAINE-EPINEPHRINE 0.25% -1:200000 IJ SOLN
INTRAMUSCULAR | Status: DC | PRN
  Administered 2013-02-12: 16 mL

## 2013-02-12 MED ORDER — HYDROCODONE-ACETAMINOPHEN 5-325 MG PO TABS: 1.0000 | ORAL_TABLET | Freq: Four times a day (QID) | ORAL | Status: DC | PRN

## 2013-02-12 MED ORDER — FENTANYL CITRATE 0.05 MG/ML IJ SOLN
INTRAMUSCULAR | Status: DC | PRN
  Administered 2013-02-12: 25 ug via INTRAVENOUS
  Administered 2013-02-12: 50 ug via INTRAVENOUS

## 2013-02-12 MED ORDER — 0.9 % SODIUM CHLORIDE (POUR BTL) OPTIME
TOPICAL | Status: DC | PRN
  Administered 2013-02-12: 400 mL

## 2013-02-12 MED ORDER — PROPOFOL 10 MG/ML IV BOLUS
INTRAVENOUS | Status: DC | PRN
  Administered 2013-02-12: 160 mg via INTRAVENOUS

## 2013-02-12 MED ORDER — TRAMADOL HCL 50 MG PO TABS: 50.0000 mg | ORAL_TABLET | Freq: Four times a day (QID) | ORAL | Status: DC | PRN

## 2013-02-12 SURGICAL SUPPLY — 39 items
BLADE SURG 10 STRL SS (BLADE) ×2 IMPLANT
BLADE SURG 15 STRL LF DISP TIS (BLADE) ×1 IMPLANT
BLADE SURG 15 STRL SS (BLADE) ×1
CANISTER SUCTION 1200CC (MISCELLANEOUS) ×2 IMPLANT
CHLORAPREP W/TINT 26ML (MISCELLANEOUS) ×2 IMPLANT
CLIP TI WIDE RED SMALL 6 (CLIP) IMPLANT
CLOTH BEACON ORANGE TIMEOUT ST (SAFETY) ×2 IMPLANT
COVER MAYO STAND STRL (DRAPES) ×2 IMPLANT
COVER TABLE BACK 60X90 (DRAPES) ×2 IMPLANT
DECANTER SPIKE VIAL GLASS SM (MISCELLANEOUS) ×2 IMPLANT
DERMABOND ADVANCED (GAUZE/BANDAGES/DRESSINGS) ×1
DERMABOND ADVANCED .7 DNX12 (GAUZE/BANDAGES/DRESSINGS) ×1 IMPLANT
DEVICE DUBIN W/COMP PLATE 8390 (MISCELLANEOUS) ×2 IMPLANT
DRAPE LAPAROSCOPIC ABDOMINAL (DRAPES) ×2 IMPLANT
DRAPE UTILITY XL STRL (DRAPES) ×2 IMPLANT
ELECT COATED BLADE 2.86 ST (ELECTRODE) ×2 IMPLANT
ELECT REM PT RETURN 9FT ADLT (ELECTROSURGICAL) ×2
ELECTRODE REM PT RTRN 9FT ADLT (ELECTROSURGICAL) ×1 IMPLANT
GLOVE BIO SURGEON STRL SZ7.5 (GLOVE) ×2 IMPLANT
GLOVE ECLIPSE 6.5 STRL STRAW (GLOVE) ×2 IMPLANT
GOWN PREVENTION PLUS XLARGE (GOWN DISPOSABLE) ×2 IMPLANT
NEEDLE HYPO 25X1 1.5 SAFETY (NEEDLE) ×2 IMPLANT
NS IRRIG 1000ML POUR BTL (IV SOLUTION) ×2 IMPLANT
PACK BASIN DAY SURGERY FS (CUSTOM PROCEDURE TRAY) ×2 IMPLANT
PENCIL BUTTON HOLSTER BLD 10FT (ELECTRODE) ×2 IMPLANT
SLEEVE SCD COMPRESS KNEE MED (MISCELLANEOUS) ×2 IMPLANT
SPONGE LAP 18X18 X RAY DECT (DISPOSABLE) ×2 IMPLANT
STAPLER VISISTAT 35W (STAPLE) IMPLANT
SUT MON AB 4-0 PC3 18 (SUTURE) ×2 IMPLANT
SUT SILK 2 0 SH (SUTURE) ×2 IMPLANT
SUT VIC AB 3-0 54X BRD REEL (SUTURE) IMPLANT
SUT VIC AB 3-0 BRD 54 (SUTURE)
SUT VICRYL 3-0 CR8 SH (SUTURE) ×2 IMPLANT
SYR CONTROL 10ML LL (SYRINGE) ×2 IMPLANT
TOWEL OR 17X24 6PK STRL BLUE (TOWEL DISPOSABLE) ×2 IMPLANT
TOWEL OR NON WOVEN STRL DISP B (DISPOSABLE) ×2 IMPLANT
TUBE CONNECTING 20X1/4 (TUBING) ×2 IMPLANT
WATER STERILE IRR 1000ML POUR (IV SOLUTION) IMPLANT
YANKAUER SUCT BULB TIP NO VENT (SUCTIONS) ×2 IMPLANT

## 2013-02-12 NOTE — Transfer of Care (Signed)
Immediate Anesthesia Transfer of Care Note  Patient: Jessica Mcneil  Procedure(s) Performed: Procedure(s): BREAST LUMPECTOMY WITH NEEDLE LOCALIZATION (Right)  Patient Location: PACU  Anesthesia Type:General  Level of Consciousness: awake and alert   Airway & Oxygen Therapy: Patient Spontanous Breathing and Patient connected to face mask oxygen  Post-op Assessment: Report given to PACU RN, Post -op Vital signs reviewed and stable and Patient moving all extremities  Post vital signs: Reviewed and stable  Complications: No apparent anesthesia complications

## 2013-02-12 NOTE — Anesthesia Procedure Notes (Signed)
Procedure Name: LMA Insertion Date/Time: 02/12/2013 3:08 PM Performed by: Meyer Russel Pre-anesthesia Checklist: Patient identified, Emergency Drugs available, Suction available and Patient being monitored Patient Re-evaluated:Patient Re-evaluated prior to inductionOxygen Delivery Method: Circle System Utilized Preoxygenation: Pre-oxygenation with 100% oxygen Intubation Type: IV induction Ventilation: Mask ventilation without difficulty LMA: LMA inserted LMA Size: 4.0 Number of attempts: 1 Airway Equipment and Method: bite block Placement Confirmation: positive ETCO2 and breath sounds checked- equal and bilateral Tube secured with: Tape Dental Injury: Teeth and Oropharynx as per pre-operative assessment

## 2013-02-12 NOTE — Op Note (Signed)
02/12/2013  3:52 PM  PATIENT:  Jessica Mcneil  72 y.o. female  PRE-OPERATIVE DIAGNOSIS:  Right breast mass  POST-OPERATIVE DIAGNOSIS:  Right breast mass  PROCEDURE:  Procedure(s): BREAST LUMPECTOMY WITH NEEDLE LOCALIZATION (Right)  SURGEON:  Surgeon(s) and Role:    * Robyne Askew, MD - Primary  PHYSICIAN ASSISTANT:   ASSISTANTS: none   ANESTHESIA:   general  EBL:  Total I/O In: 600 [I.V.:600] Out: -   BLOOD ADMINISTERED:none  DRAINS: none   LOCAL MEDICATIONS USED:  MARCAINE     SPECIMEN:  Source of Specimen:  right breast tissue  DISPOSITION OF SPECIMEN:  PATHOLOGY  COUNTS:  YES  TOURNIQUET:  * No tourniquets in log *  DICTATION: .Dragon Dictation After Informed consent was obtained the patient brought to the operating room placed in supine position on the operating table. After adequate induction of Gen. Anesthesia the patient's right breast was prepped with ChloraPrep, allowed to dry, and draped in usual sterile manner. Earlier in the day the patient underwent a0 wire localization procedure and the wire was entering the right breast laterally and headed medially. A transversely oriented incision was made with a 15 blade knife on the lateral breast overlying the path of the wire. This incision was carried through the skin and subcutaneous tissue sharply with electrocautery until the breast tissue was entered. Once into the breast tissues the path of the wire could be palpated. A circular portion of breast tissue was excised sharply around the path of the wire. Once the specimen was removed it was oriented with a short stitch on the superior surface and a long stitch on the lateral surface. A specimen radiograph was obtained that showed the clip and wire to be in the center of the specimen. The specimen was then sent to pathology for further evaluation. Hemostasis was achieved using the Bovie electrocautery. The wound was irrigated with copious amounts of saline and  infiltrated with quarter percent Marcaine. The deep layer the wound was then closed with interrupted 3-0 Vicryl stitches. The skin was closed with interrupted 4-0 Monocryl subcuticular stitches. Dermabond dressings were applied. The patient tolerated the procedure well. At the end of the case all needle sponge and instrument counts were correct. The patient was then awakened and taken to recovery in stable condition.  PLAN OF CARE: Discharge to home after PACU  PATIENT DISPOSITION:  PACU - hemodynamically stable.   Delay start of Pharmacological VTE agent (>24hrs) due to surgical blood loss or risk of bleeding: not applicable

## 2013-02-12 NOTE — Anesthesia Postprocedure Evaluation (Signed)
Anesthesia Post Note  Patient: Jessica Mcneil  Procedure(s) Performed: Procedure(s) (LRB): BREAST LUMPECTOMY WITH NEEDLE LOCALIZATION (Right)  Anesthesia type: General  Patient location: PACU  Post pain: Pain level controlled  Post assessment: Patient's Cardiovascular Status Stable  Last Vitals:  Filed Vitals:   02/12/13 1653  BP:   Pulse: 65  Temp:   Resp: 19    Post vital signs: Reviewed and stable  Level of consciousness: alert  Complications: No apparent anesthesia complications

## 2013-02-12 NOTE — Anesthesia Preprocedure Evaluation (Signed)
Anesthesia Evaluation  Patient identified by MRN, date of birth, ID band Patient awake    Reviewed: Allergy & Precautions, H&P , NPO status , Patient's Chart, lab work & pertinent test results, reviewed documented beta blocker date and time   History of Anesthesia Complications (+) PONV  Airway Mallampati: II TM Distance: >3 FB Neck ROM: full    Dental   Pulmonary shortness of breath and with exertion,  breath sounds clear to auscultation        Cardiovascular hypertension, On Medications and On Home Beta Blockers +CHF Rhythm:regular     Neuro/Psych  Headaches, PSYCHIATRIC DISORDERS    GI/Hepatic Neg liver ROS, GERD-  Medicated and Controlled,  Endo/Other  negative endocrine ROS  Renal/GU negative Renal ROS  negative genitourinary   Musculoskeletal   Abdominal   Peds  Hematology negative hematology ROS (+)   Anesthesia Other Findings See surgeon's H&P   Reproductive/Obstetrics negative OB ROS                           Anesthesia Physical Anesthesia Plan  ASA: III  Anesthesia Plan: General   Post-op Pain Management:    Induction: Intravenous  Airway Management Planned: LMA  Additional Equipment:   Intra-op Plan:   Post-operative Plan: Extubation in OR  Informed Consent: I have reviewed the patients History and Physical, chart, labs and discussed the procedure including the risks, benefits and alternatives for the proposed anesthesia with the patient or authorized representative who has indicated his/her understanding and acceptance.   Dental Advisory Given  Plan Discussed with: CRNA and Surgeon  Anesthesia Plan Comments:         Anesthesia Quick Evaluation

## 2013-02-12 NOTE — Interval H&P Note (Signed)
History and Physical Interval Note:  02/12/2013 2:45 PM  Jessica Mcneil  has presented today for surgery, with the diagnosis of right breast mass  The various methods of treatment have been discussed with the patient and family. After consideration of risks, benefits and other options for treatment, the patient has consented to  Procedure(s): BREAST LUMPECTOMY WITH NEEDLE LOCALIZATION (Right) as a surgical intervention .  The patient's history has been reviewed, patient examined, no change in status, stable for surgery.  I have reviewed the patient's chart and labs.  Questions were answered to the patient's satisfaction.     TOTH III,PAUL S

## 2013-02-12 NOTE — H&P (View-Only) (Signed)
Subjective:     Patient ID: Jessica Mcneil, female   DOB: 04-Dec-1941, 72 y.o.   MRN: [id number]  HPI We're asked to see the patient in consultation by Dr. Caryl Never to evaluate her for an abnormal mammogram. The patient is a 72 year old white female who recently went for a routine screening mammogram. At that time she denied any breast pain. She denies any discharge from her nipple. She does occasional self exams and has not felt any masses. She has no personal or family history of breast problems other than some fibrocystic disease. The abnormality was biopsied and came back as fibrocystic disease. The result was felt to be discordant with the mammographic image.  Review of Systems  Constitutional: Negative.   HENT: Negative.   Eyes: Negative.   Respiratory: Negative.   Cardiovascular: Negative.   Gastrointestinal: Negative.   Genitourinary: Negative.   Musculoskeletal: Negative.   Skin: Negative.   Neurological: Negative.   Hematological: Negative.   Psychiatric/Behavioral: Negative.        Objective:   Physical Exam  Constitutional: She is oriented to person, place, and time. She appears well-developed and well-nourished.  HENT:  Head: Normocephalic and atraumatic.  Eyes: Conjunctivae normal and EOM are normal. Pupils are equal, round, and reactive to light.  Neck: Normal range of motion. Neck supple.  Cardiovascular: Normal rate, regular rhythm and normal heart sounds.   Pulmonary/Chest: Effort normal and breath sounds normal.       There is no palpable mass in either breast. There is no palpable axillary supraclavicular cervical lymphadenopathy  Abdominal: Soft. Bowel sounds are normal. She exhibits no mass. There is no tenderness.  Musculoskeletal: Normal range of motion.  Lymphadenopathy:    She has no cervical adenopathy.  Neurological: She is alert and oriented to person, place, and time.  Skin: Skin is warm and dry.  Psychiatric: She has a normal mood and affect. Her  behavior is normal.       Assessment:     The patient has an abnormal mammogram with discordant pathologic findings after core biopsy. Because of this the recommendation is for an open biopsy of this area. I've discussed with her in detail the risks and benefits of the operation as well as some of the technical aspects and she understands and wishes to proceed    Plan:     Plan for right breast wire localized lumpectomy

## 2013-02-15 ENCOUNTER — Encounter (HOSPITAL_BASED_OUTPATIENT_CLINIC_OR_DEPARTMENT_OTHER): Payer: Self-pay | Admitting: General Surgery

## 2013-02-16 ENCOUNTER — Encounter (INDEPENDENT_AMBULATORY_CARE_PROVIDER_SITE_OTHER): Payer: Medicare Other | Admitting: General Surgery

## 2013-02-17 ENCOUNTER — Telehealth (INDEPENDENT_AMBULATORY_CARE_PROVIDER_SITE_OTHER): Payer: Self-pay

## 2013-02-17 NOTE — Telephone Encounter (Signed)
I called patient and gave her benign path results.

## 2013-02-23 ENCOUNTER — Encounter (INDEPENDENT_AMBULATORY_CARE_PROVIDER_SITE_OTHER): Payer: Medicare Other | Admitting: General Surgery

## 2013-03-02 DIAGNOSIS — H401490 Capsular glaucoma with pseudoexfoliation of lens, unspecified eye, stage unspecified: Secondary | ICD-10-CM | POA: Diagnosis not present

## 2013-03-23 ENCOUNTER — Ambulatory Visit (INDEPENDENT_AMBULATORY_CARE_PROVIDER_SITE_OTHER): Payer: Medicare Other | Admitting: General Surgery

## 2013-03-23 ENCOUNTER — Encounter (INDEPENDENT_AMBULATORY_CARE_PROVIDER_SITE_OTHER): Payer: Self-pay | Admitting: General Surgery

## 2013-03-23 VITALS — BP 114/70 | HR 72 | Resp 14 | Ht 67.0 in | Wt 181.8 lb

## 2013-03-23 DIAGNOSIS — R920 Mammographic microcalcification found on diagnostic imaging of breast: Secondary | ICD-10-CM

## 2013-03-23 NOTE — Progress Notes (Signed)
Subjective:     Patient ID: Jessica Mcneil, female   DOB: [birth year], 72 y.o.   MRN: [id number]  HPI The patient is a 72 year old white female who is a couple weeks status post right lumpectomy for benign disease. She denies any breast pain. She has no complaints today.  Review of Systems     Objective:   Physical Exam On exam her right breast incision is healing nicely with no sign of infection or significant seroma.    Assessment:     The patient is status post right lumpectomy for benign disease     Plan:     At this point she can return to her normal activities without any restrictions. She will need to continue to do regular self exams and get her yearly mammogram. We will plan to see her back on a when necessary basis

## 2013-03-23 NOTE — Patient Instructions (Signed)
Continue regular self exams Continue yearly mammogram

## 2013-05-06 DIAGNOSIS — Z1211 Encounter for screening for malignant neoplasm of colon: Secondary | ICD-10-CM | POA: Diagnosis not present

## 2013-05-06 DIAGNOSIS — K5900 Constipation, unspecified: Secondary | ICD-10-CM | POA: Diagnosis not present

## 2013-05-06 DIAGNOSIS — K219 Gastro-esophageal reflux disease without esophagitis: Secondary | ICD-10-CM | POA: Diagnosis not present

## 2013-05-11 DIAGNOSIS — M542 Cervicalgia: Secondary | ICD-10-CM | POA: Diagnosis not present

## 2013-05-20 DIAGNOSIS — M542 Cervicalgia: Secondary | ICD-10-CM | POA: Diagnosis not present

## 2013-05-26 ENCOUNTER — Other Ambulatory Visit (HOSPITAL_COMMUNITY): Payer: Self-pay | Admitting: *Deleted

## 2013-05-26 MED ORDER — HYDROCHLOROTHIAZIDE 12.5 MG PO CAPS: 12.5000 mg | ORAL_CAPSULE | ORAL | Status: DC

## 2013-05-31 ENCOUNTER — Other Ambulatory Visit (HOSPITAL_COMMUNITY): Payer: Self-pay | Admitting: *Deleted

## 2013-05-31 MED ORDER — HYDROCHLOROTHIAZIDE 12.5 MG PO CAPS: 12.5000 mg | ORAL_CAPSULE | ORAL | Status: DC

## 2013-06-03 DIAGNOSIS — H43819 Vitreous degeneration, unspecified eye: Secondary | ICD-10-CM | POA: Diagnosis not present

## 2013-06-03 DIAGNOSIS — H4011X0 Primary open-angle glaucoma, stage unspecified: Secondary | ICD-10-CM | POA: Diagnosis not present

## 2013-06-03 DIAGNOSIS — H409 Unspecified glaucoma: Secondary | ICD-10-CM | POA: Diagnosis not present

## 2013-06-08 ENCOUNTER — Other Ambulatory Visit (HOSPITAL_COMMUNITY): Payer: Self-pay | Admitting: *Deleted

## 2013-06-08 MED ORDER — METOPROLOL SUCCINATE ER 50 MG PO TB24: 50.0000 mg | ORAL_TABLET | Freq: Every day | ORAL | Status: DC

## 2013-07-05 DIAGNOSIS — D126 Benign neoplasm of colon, unspecified: Secondary | ICD-10-CM | POA: Diagnosis not present

## 2013-07-05 DIAGNOSIS — K6389 Other specified diseases of intestine: Secondary | ICD-10-CM | POA: Diagnosis not present

## 2013-07-05 DIAGNOSIS — Z1211 Encounter for screening for malignant neoplasm of colon: Secondary | ICD-10-CM | POA: Diagnosis not present

## 2013-07-05 DIAGNOSIS — K5900 Constipation, unspecified: Secondary | ICD-10-CM | POA: Diagnosis not present

## 2013-07-29 ENCOUNTER — Encounter: Payer: Self-pay | Admitting: Family Medicine

## 2013-08-03 ENCOUNTER — Ambulatory Visit (HOSPITAL_COMMUNITY)
Admission: RE | Admit: 2013-08-03 | Discharge: 2013-08-03 | Disposition: A | Discharge status: Home / Self Care | Payer: Medicare Other | Source: Ambulatory Visit | Attending: Internal Medicine | Admitting: Internal Medicine

## 2013-08-03 ENCOUNTER — Encounter (HOSPITAL_COMMUNITY): Payer: Self-pay

## 2013-08-03 VITALS — BP 108/66 | HR 84 | Ht 67.0 in | Wt 179.4 lb

## 2013-08-03 DIAGNOSIS — E663 Overweight: Secondary | ICD-10-CM | POA: Insufficient documentation

## 2013-08-03 DIAGNOSIS — I10 Essential (primary) hypertension: Secondary | ICD-10-CM | POA: Insufficient documentation

## 2013-08-03 DIAGNOSIS — E785 Hyperlipidemia, unspecified: Secondary | ICD-10-CM | POA: Insufficient documentation

## 2013-08-03 DIAGNOSIS — I509 Heart failure, unspecified: Secondary | ICD-10-CM | POA: Diagnosis not present

## 2013-08-03 DIAGNOSIS — R0602 Shortness of breath: Secondary | ICD-10-CM | POA: Diagnosis not present

## 2013-08-03 DIAGNOSIS — I5022 Chronic systolic (congestive) heart failure: Secondary | ICD-10-CM

## 2013-08-03 DIAGNOSIS — K219 Gastro-esophageal reflux disease without esophagitis: Secondary | ICD-10-CM | POA: Insufficient documentation

## 2013-08-03 DIAGNOSIS — I428 Other cardiomyopathies: Secondary | ICD-10-CM | POA: Diagnosis not present

## 2013-08-03 LAB — BASIC METABOLIC PANEL
CO2: 29 mEq/L (ref 19–32)
Chloride: 101 mEq/L (ref 96–112)
GFR calc non Af Amer: 69 mL/min — ABNORMAL LOW (ref >90)
Glucose, Bld: 130 mg/dL — ABNORMAL HIGH (ref 70–99)
Potassium: 3.7 mEq/L (ref 3.5–5.1)
Sodium: 140 mEq/L (ref 135–145)

## 2013-08-03 MED ORDER — METOPROLOL SUCCINATE ER 25 MG PO TB24: 25.0000 mg | ORAL_TABLET | Freq: Every day | ORAL | Status: DC

## 2013-08-03 MED ORDER — HYDROCHLOROTHIAZIDE 12.5 MG PO CAPS: 12.5000 mg | ORAL_CAPSULE | ORAL | Status: DC

## 2013-08-03 NOTE — Patient Instructions (Addendum)
Cut Toprol back to 25 mg daily.  Call if any signs or symptoms of heart failure such as increased SOB, swelling in abdomen or ankles, or weight gain.  F/U 1 year.

## 2013-08-03 NOTE — Progress Notes (Signed)
Patient ID: Jessica Mcneil, female   DOB: 06-Feb-1941, 72 y.o.   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 Most recent ejection fraction in 4/12 was 60-65%. She also has a hx of a R lumpectomy for benign dz 2/14.  1. 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   February 19, 2006 cardiac catheterization revealing normal            coronary arteries with an EF of 50%. EF 35-40% by ECHO.      b.   Echo 2008: EF 40-50% with mild diffuse            LV hypokinesis and abnormal left ventricular relaxation.  Mild AI.      c.   Echo 8/09 EF 55-60%. Trivial AI     d.   CPX 4/07, pVO2: 20.6 (105% predicted) slope of 42.7.  pRER 1.09.            There was a mild restriction on her spirometry.     E. Echo 4/12 EF 60-65% grade 1 DD. Mild MR/AI  Follow up: Last visit stopped lisinopril and BP at home 120/70s. Reports doing well. Occasional DOE, however does not think it has changed. Reports that she can walk all the way through the grocery store without stopping, however goes at a slower pace. Started doing yoga. Denies CP, PND, or orthopnea. Minimal edema. Weight at home stable.  ROS: All systems negative except as listed in HPI, PMH and Problem List.  Past Medical History  Diagnosis Date  . CHF (congestive heart failure)     due to non ischemic cardiomyopathy  . HLD (hyperlipidemia)   . HTN (hypertension)   . Overweight(278.02)   . GERD (gastroesophageal reflux disease)   . Depression   . PONV (postoperative nausea and vomiting)     long time ago none recent  . Shortness of breath   . Headache(784.0)     migraines  . Arthritis     Current Outpatient Prescriptions  Medication Sig Dispense Refill  . ALPRAZolam (XANAX) 0.5 MG tablet Take 0.5 mg by mouth at bedtime as needed. For sleep/anxiety      . aspirin EC 81 MG tablet Take 81 mg by mouth daily.      . brinzolamide (AZOPT) 1 % ophthalmic  suspension Place 1 drop into both eyes daily.      . calcium-vitamin D (OSCAL WITH D) 500-200 MG-UNIT per tablet Take 1 tablet by mouth daily.      . COMBIGAN 0.2-0.5 % ophthalmic solution Place 1 drop into the left eye 2 (two) times daily.       [name] FLUoxetine (PROZAC) 20 MG tablet Take 1 tablet (20 mg total) by mouth daily.  30 tablet  6  . hydrochlorothiazide (MICROZIDE) 12.5 MG capsule Take 1 capsule (12.5 mg total) by mouth every morning.  30 capsule  3  . HYDROcodone-acetaminophen (NORCO) 5-325 MG per tablet Take 1-2 tablets by mouth every 6 (six) hours as needed for pain.  30 tablet  1  . HYDROcodone-acetaminophen (NORCO/VICODIN) 5-325 MG per tablet Take 1 tablet by mouth every 6 (six) hours as needed. For migraines      . metoprolol succinate (TOPROL-XL) 50 MG 24 hr tablet Take 1 tablet (50 mg total) by mouth daily. Take with or immediately following a meal.  30 tablet  2  . Multiple Vitamin (MULTIVITAMIN WITH MINERALS)  TABS Take 1 tablet by mouth daily.      . Omega-3 Fatty Acids 1200 MG CAPS Take 1,200 mg by mouth daily.      [name] omeprazole (PRILOSEC) 20 MG capsule Take 20 mg by mouth daily.        No current facility-administered medications for this encounter.   Filed Vitals:   08/03/13 0925  BP: 108/66  Pulse: 84  Height: 5\' 7"  (1.702 m)  Weight: 179 lb 6.4 oz (81.375 kg)  SpO2: 94%    PHYSICAL EXAM: General:  Well appearing. No resp difficulty HEENT: normal Neck: supple. JVP 6 Carotids 2+ bilaterally; no bruits. No lymphadenopathy or thryomegaly appreciated. Cor: PMI normal. Regular rate & rhythm. No rubs, murmurs. +s4 Lungs: clear Abdomen: obese soft, nontender, nondistended. No hepatosplenomegaly. No bruits or masses. Good bowel sounds. Extremities: no cyanosis, clubbing, rash, edema Neuro: alert & orientedx3, cranial nerves grossly intact. Moves all 4 extremities w/o difficulty. Affect pleasant.   ASSESSMENT & PLAN:  1) Hx Chronic systolic HF, EF 60-65% improved -  NYHA II symptoms. Volume status stable. - Doing well and EF recovered, last visit stopped lisinopril. - Will cut Toprol back to 25 mg daily. Instructed patient to call if any weight gain, SOB or increased DOE. - F/u yr with ECHO.   2) HTN  - Controlled on hctz and Toprol. - Will cut Toprol back to 25 mg daily to help with fatigue, instructed to watch BP and to call if SBP over 140.  Ulla Potash B  NP-C  10:12 AM

## 2013-08-16 DIAGNOSIS — H40009 Preglaucoma, unspecified, unspecified eye: Secondary | ICD-10-CM | POA: Diagnosis not present

## 2013-08-17 ENCOUNTER — Other Ambulatory Visit: Payer: Self-pay | Admitting: Family Medicine

## 2013-09-13 DIAGNOSIS — Z23 Encounter for immunization: Secondary | ICD-10-CM | POA: Diagnosis not present

## 2013-09-24 DIAGNOSIS — H401490 Capsular glaucoma with pseudoexfoliation of lens, unspecified eye, stage unspecified: Secondary | ICD-10-CM | POA: Diagnosis not present

## 2013-09-24 DIAGNOSIS — H409 Unspecified glaucoma: Secondary | ICD-10-CM | POA: Diagnosis not present

## 2013-10-28 DIAGNOSIS — M25529 Pain in unspecified elbow: Secondary | ICD-10-CM | POA: Diagnosis not present

## 2013-11-02 DIAGNOSIS — M25529 Pain in unspecified elbow: Secondary | ICD-10-CM | POA: Diagnosis not present

## 2013-11-03 DIAGNOSIS — H401490 Capsular glaucoma with pseudoexfoliation of lens, unspecified eye, stage unspecified: Secondary | ICD-10-CM | POA: Diagnosis not present

## 2013-11-03 DIAGNOSIS — H409 Unspecified glaucoma: Secondary | ICD-10-CM | POA: Diagnosis not present

## 2013-11-04 DIAGNOSIS — M7989 Other specified soft tissue disorders: Secondary | ICD-10-CM | POA: Diagnosis not present

## 2013-11-04 DIAGNOSIS — H401423 Capsular glaucoma with pseudoexfoliation of lens, left eye, severe stage: Secondary | ICD-10-CM | POA: Insufficient documentation

## 2013-11-04 DIAGNOSIS — M25473 Effusion, unspecified ankle: Secondary | ICD-10-CM | POA: Diagnosis not present

## 2013-11-04 DIAGNOSIS — M659 Synovitis and tenosynovitis, unspecified: Secondary | ICD-10-CM | POA: Diagnosis not present

## 2013-11-05 DIAGNOSIS — M25579 Pain in unspecified ankle and joints of unspecified foot: Secondary | ICD-10-CM | POA: Diagnosis not present

## 2013-11-12 DIAGNOSIS — M659 Synovitis and tenosynovitis, unspecified: Secondary | ICD-10-CM | POA: Diagnosis not present

## 2013-11-23 DIAGNOSIS — F32A Depression, unspecified: Secondary | ICD-10-CM | POA: Insufficient documentation

## 2013-11-23 DIAGNOSIS — Z7982 Long term (current) use of aspirin: Secondary | ICD-10-CM | POA: Diagnosis not present

## 2013-11-23 DIAGNOSIS — F339 Major depressive disorder, recurrent, unspecified: Secondary | ICD-10-CM | POA: Insufficient documentation

## 2013-11-23 DIAGNOSIS — H409 Unspecified glaucoma: Secondary | ICD-10-CM | POA: Diagnosis not present

## 2013-11-23 DIAGNOSIS — Z87891 Personal history of nicotine dependence: Secondary | ICD-10-CM | POA: Diagnosis not present

## 2013-11-23 DIAGNOSIS — H4011X0 Primary open-angle glaucoma, stage unspecified: Secondary | ICD-10-CM | POA: Diagnosis not present

## 2013-11-23 DIAGNOSIS — H401490 Capsular glaucoma with pseudoexfoliation of lens, unspecified eye, stage unspecified: Secondary | ICD-10-CM | POA: Diagnosis not present

## 2013-11-23 DIAGNOSIS — K219 Gastro-esophageal reflux disease without esophagitis: Secondary | ICD-10-CM | POA: Insufficient documentation

## 2013-11-23 DIAGNOSIS — I420 Dilated cardiomyopathy: Secondary | ICD-10-CM | POA: Insufficient documentation

## 2013-11-23 DIAGNOSIS — I10 Essential (primary) hypertension: Secondary | ICD-10-CM | POA: Insufficient documentation

## 2013-11-24 DIAGNOSIS — H409 Unspecified glaucoma: Secondary | ICD-10-CM | POA: Diagnosis not present

## 2013-11-24 DIAGNOSIS — H401490 Capsular glaucoma with pseudoexfoliation of lens, unspecified eye, stage unspecified: Secondary | ICD-10-CM | POA: Diagnosis not present

## 2013-11-25 DIAGNOSIS — M25579 Pain in unspecified ankle and joints of unspecified foot: Secondary | ICD-10-CM | POA: Diagnosis not present

## 2013-12-01 DIAGNOSIS — H409 Unspecified glaucoma: Secondary | ICD-10-CM | POA: Diagnosis not present

## 2013-12-01 DIAGNOSIS — H401490 Capsular glaucoma with pseudoexfoliation of lens, unspecified eye, stage unspecified: Secondary | ICD-10-CM | POA: Diagnosis not present

## 2013-12-02 DIAGNOSIS — M109 Gout, unspecified: Secondary | ICD-10-CM | POA: Diagnosis not present

## 2013-12-07 ENCOUNTER — Other Ambulatory Visit: Payer: Self-pay

## 2013-12-07 DIAGNOSIS — Z1231 Encounter for screening mammogram for malignant neoplasm of breast: Secondary | ICD-10-CM

## 2013-12-08 DIAGNOSIS — H401490 Capsular glaucoma with pseudoexfoliation of lens, unspecified eye, stage unspecified: Secondary | ICD-10-CM | POA: Diagnosis not present

## 2013-12-08 DIAGNOSIS — H409 Unspecified glaucoma: Secondary | ICD-10-CM | POA: Diagnosis not present

## 2013-12-31 DIAGNOSIS — H409 Unspecified glaucoma: Secondary | ICD-10-CM | POA: Diagnosis not present

## 2013-12-31 DIAGNOSIS — H401490 Capsular glaucoma with pseudoexfoliation of lens, unspecified eye, stage unspecified: Secondary | ICD-10-CM | POA: Diagnosis not present

## 2014-01-05 DIAGNOSIS — H401490 Capsular glaucoma with pseudoexfoliation of lens, unspecified eye, stage unspecified: Secondary | ICD-10-CM | POA: Diagnosis not present

## 2014-01-05 DIAGNOSIS — H409 Unspecified glaucoma: Secondary | ICD-10-CM | POA: Diagnosis not present

## 2014-01-11 ENCOUNTER — Ambulatory Visit: Payer: Medicare Other

## 2014-01-12 DIAGNOSIS — H409 Unspecified glaucoma: Secondary | ICD-10-CM | POA: Diagnosis not present

## 2014-01-12 DIAGNOSIS — H401490 Capsular glaucoma with pseudoexfoliation of lens, unspecified eye, stage unspecified: Secondary | ICD-10-CM | POA: Diagnosis not present

## 2014-01-17 DIAGNOSIS — H409 Unspecified glaucoma: Secondary | ICD-10-CM | POA: Diagnosis not present

## 2014-01-17 DIAGNOSIS — H401490 Capsular glaucoma with pseudoexfoliation of lens, unspecified eye, stage unspecified: Secondary | ICD-10-CM | POA: Diagnosis not present

## 2014-02-01 ENCOUNTER — Ambulatory Visit
Admission: RE | Admit: 2014-02-01 | Discharge: 2014-02-01 | Disposition: A | Discharge status: Home / Self Care | Payer: Medicare Other | Source: Ambulatory Visit

## 2014-02-01 DIAGNOSIS — Z1231 Encounter for screening mammogram for malignant neoplasm of breast: Secondary | ICD-10-CM

## 2014-02-02 ENCOUNTER — Encounter: Payer: Self-pay | Admitting: Family Medicine

## 2014-02-02 ENCOUNTER — Other Ambulatory Visit: Payer: Self-pay

## 2014-02-02 ENCOUNTER — Ambulatory Visit (INDEPENDENT_AMBULATORY_CARE_PROVIDER_SITE_OTHER): Payer: Medicare Other | Admitting: Family Medicine

## 2014-02-02 VITALS — BP 110/80 | HR 87 | Temp 98.0°F | Wt 176.0 lb

## 2014-02-02 DIAGNOSIS — F329 Major depressive disorder, single episode, unspecified: Secondary | ICD-10-CM

## 2014-02-02 DIAGNOSIS — M109 Gout, unspecified: Secondary | ICD-10-CM | POA: Diagnosis not present

## 2014-02-02 DIAGNOSIS — H401490 Capsular glaucoma with pseudoexfoliation of lens, unspecified eye, stage unspecified: Secondary | ICD-10-CM | POA: Diagnosis not present

## 2014-02-02 DIAGNOSIS — I10 Essential (primary) hypertension: Secondary | ICD-10-CM

## 2014-02-02 DIAGNOSIS — H409 Unspecified glaucoma: Secondary | ICD-10-CM | POA: Diagnosis not present

## 2014-02-02 DIAGNOSIS — F3289 Other specified depressive episodes: Secondary | ICD-10-CM

## 2014-02-02 MED ORDER — COLCHICINE 0.6 MG PO TABS: 0.6000 mg | ORAL_TABLET | Freq: Every day | ORAL | Status: DC

## 2014-02-02 MED ORDER — ALPRAZOLAM 0.5 MG PO TABS: 0.5000 mg | ORAL_TABLET | Freq: Every evening | ORAL | Status: DC | PRN

## 2014-02-02 MED ORDER — FLUOXETINE HCL 20 MG PO CAPS: ORAL_CAPSULE | ORAL | Status: DC

## 2014-02-02 MED ORDER — FLUOXETINE HCL 20 MG PO CAPS
ORAL_CAPSULE | ORAL | Status: DC
Start: 2014-02-02 — End: 2014-02-02

## 2014-02-02 MED ORDER — LOSARTAN POTASSIUM 50 MG PO TABS: 50.0000 mg | ORAL_TABLET | Freq: Every day | ORAL | Status: DC

## 2014-02-02 NOTE — Progress Notes (Signed)
Pre visit review using our clinic review tool, if applicable. No additional management support is needed unless otherwise documented below in the visit note. 

## 2014-02-02 NOTE — Progress Notes (Signed)
Subjective:    Patient ID: Jessica Mcneil, female    DOB: 04/15/1941, 73 y.o.   MRN: [id number]  HPI Patient seen for the following issues She was diagnosed with gout by orthopedist left foot several weeks ago. No reported history of gout. She promptly improved with prednisone. She does take HCT. No regular alcohol use. She was placed on Colcrys 0.6 mg once daily which she is taking regularly and has not had any recurrence.  Her chronic problems include history of hypertension, hyperlipidemia, depression, chronic insomnia, nonischemic cardiomyopathy. For blood pressure she is currently taking only HCTZ and metoprolol. Previously on ACE inhibitor but had hypotension.  Her depression is stable on fluoxetine and she is requesting refills. She has had recurrences of depression which his tried stopping as the past. Denies any suicidal ideation  Past Medical History  Diagnosis Date  . CHF (congestive heart failure)     due to non ischemic cardiomyopathy  . HLD (hyperlipidemia)   . HTN (hypertension)   . Overweight   . GERD (gastroesophageal reflux disease)   . Depression   . PONV (postoperative nausea and vomiting)     long time ago none recent  . Shortness of breath   . Headache(784.0)     migraines  . Arthritis    Past Surgical History  Procedure Laterality Date  . Cholecystectomy    . Vaginal hysterectomy  1998  . Anterior and posterior repair  03  . Tonsillectomy    . Appendectomy    . Colonoscopy    . Belpharoptosis repair      bilat  . Breast lumpectomy with needle localization Right 02/12/2013    Procedure: BREAST LUMPECTOMY WITH NEEDLE LOCALIZATION;  Surgeon: Merrie Roof, MD;  Location: [facility];  Service: General;  Laterality: Right;    reports that she quit smoking about 31 years ago. Her smoking use included Cigarettes. She smoked 2.00 packs per day. She does not have any smokeless tobacco history on file. She reports that she does not drink  alcohol or use illicit drugs. family history includes Arthritis in an other family member; Heart disease in her father; Hyperlipidemia in an other family member. Allergies  Allergen Reactions  . Codeine     REACTION: itiching  . Erythromycin     REACTION: GI upset      Review of Systems  Constitutional: Negative for fatigue.  Eyes: Negative for visual disturbance.  Respiratory: Negative for cough, chest tightness, shortness of breath and wheezing.   Cardiovascular: Negative for chest pain, palpitations and leg swelling.  Endocrine: Negative for polydipsia and polyuria.  Musculoskeletal: Positive for arthralgias.  Neurological: Negative for dizziness, seizures, syncope, weakness, light-headedness and headaches.       Objective:   Physical Exam  Constitutional: She is oriented to person, place, and time. She appears well-developed and well-nourished.  Neck: Neck supple. No thyromegaly present.  Cardiovascular: Normal rate.   Pulmonary/Chest: Effort normal and breath sounds normal. No respiratory distress. She has no wheezes. She has no rales.  Musculoskeletal: She exhibits no edema.  Neurological: She is alert and oriented to person, place, and time. No cranial nerve deficit.          Assessment & Plan:  #1 history of recurrent depression. Stable. Refill fluoxetine for one year. Wrote limited alprazolam which she takes very sparingly for severe insomnia issues #2 reported recent gout. Discontinued HCTZ. Start losartan 50 mg once daily. We have not recommended prophylactic medication such  as allopurinol this time as she's having more frequent episodes with change above.  Reassess 3-4 weeks #3 hypertension currently stable.

## 2014-02-02 NOTE — Patient Instructions (Signed)
Stop HCTZ Start Losartan one daily Let me know if you have any further gout flares

## 2014-02-08 ENCOUNTER — Other Ambulatory Visit: Payer: Self-pay

## 2014-02-08 MED ORDER — HYDROCHLOROTHIAZIDE 12.5 MG PO CAPS: 12.5000 mg | ORAL_CAPSULE | Freq: Every day | ORAL | Status: DC

## 2014-02-14 DIAGNOSIS — H409 Unspecified glaucoma: Secondary | ICD-10-CM | POA: Diagnosis not present

## 2014-02-14 DIAGNOSIS — H401490 Capsular glaucoma with pseudoexfoliation of lens, unspecified eye, stage unspecified: Secondary | ICD-10-CM | POA: Diagnosis not present

## 2014-02-28 ENCOUNTER — Encounter: Payer: Self-pay | Admitting: *Deleted

## 2014-03-01 ENCOUNTER — Ambulatory Visit: Payer: Medicare Other | Admitting: Family Medicine

## 2014-03-01 DIAGNOSIS — Z0289 Encounter for other administrative examinations: Secondary | ICD-10-CM

## 2014-03-07 DIAGNOSIS — H401490 Capsular glaucoma with pseudoexfoliation of lens, unspecified eye, stage unspecified: Secondary | ICD-10-CM | POA: Diagnosis not present

## 2014-03-07 DIAGNOSIS — H409 Unspecified glaucoma: Secondary | ICD-10-CM | POA: Diagnosis not present

## 2014-03-14 IMAGING — CR DG CHEST 2V
2 series · 2 of 2 positions shown · non-contrast
Comparison: 09/04/2011

CLINICAL DATA: Preop for right breast lumpectomy

CHEST - 2 VIEW

[view not recorded (1 of 2)]
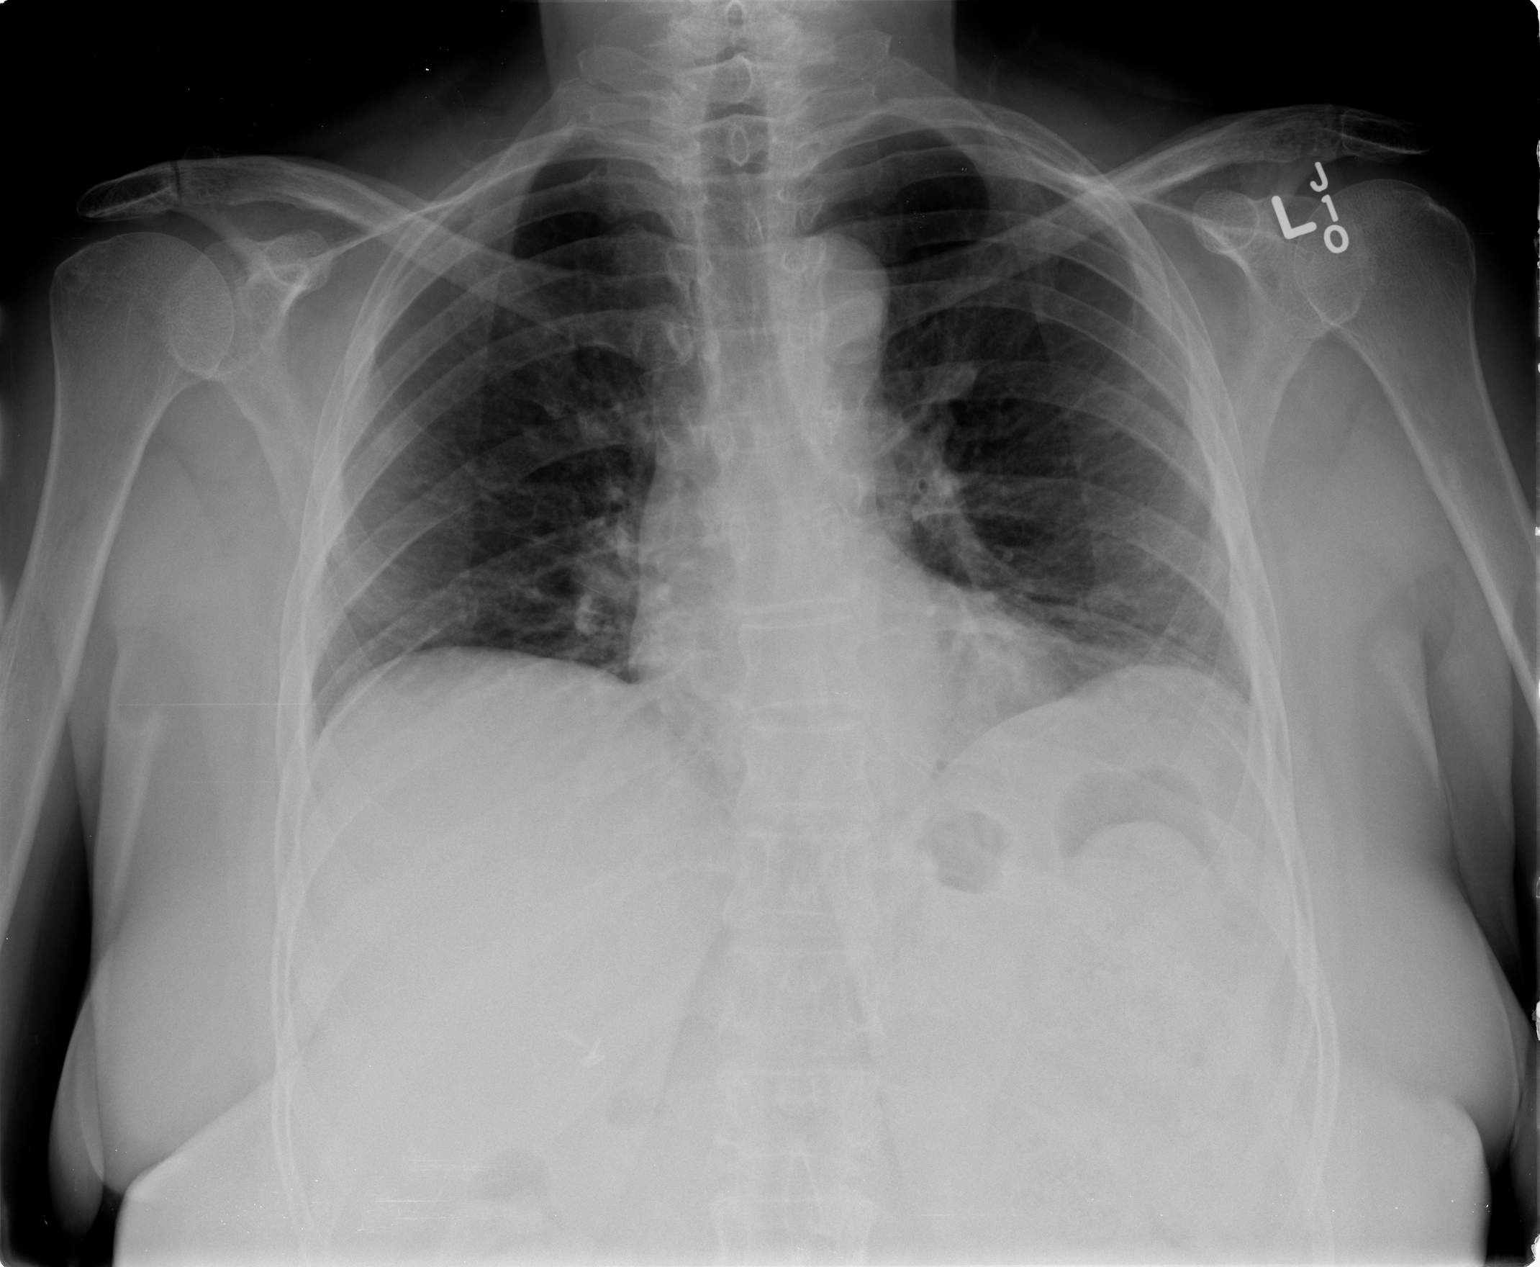

[view not recorded (2 of 2)]
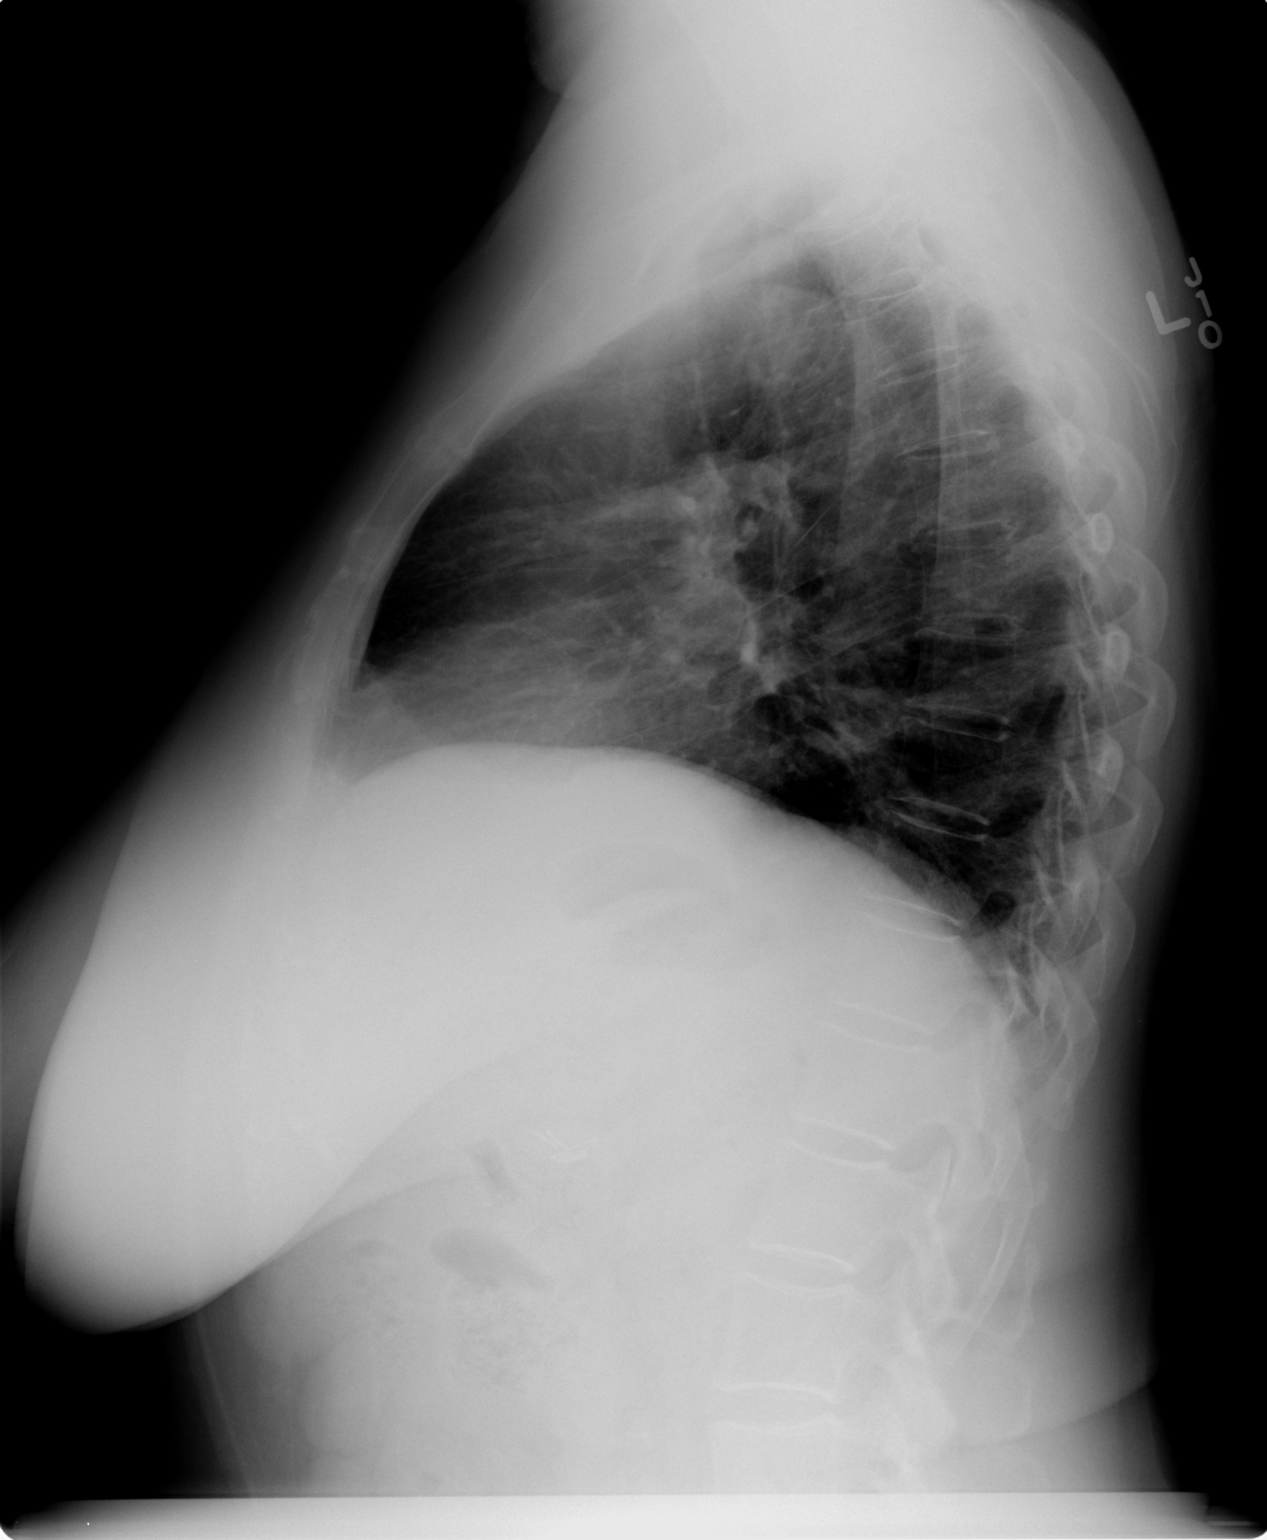

[2 of 2 positions shown; findings below may reference images not displayed]

FINDINGS: Cardiomediastinal silhouette is stable.  No acute
infiltrate or pleural effusion.  No pulmonary edema.  Bony thorax
is unremarkable. Again noted chronic mild interstitial prominence.
IMPRESSION: No active disease.  No significant change.

## 2014-03-30 DIAGNOSIS — M25819 Other specified joint disorders, unspecified shoulder: Secondary | ICD-10-CM | POA: Diagnosis not present

## 2014-04-13 DIAGNOSIS — H409 Unspecified glaucoma: Secondary | ICD-10-CM | POA: Diagnosis not present

## 2014-04-13 DIAGNOSIS — H401490 Capsular glaucoma with pseudoexfoliation of lens, unspecified eye, stage unspecified: Secondary | ICD-10-CM | POA: Diagnosis not present

## 2014-06-06 ENCOUNTER — Encounter (HOSPITAL_COMMUNITY): Payer: Medicare Other

## 2014-06-06 ENCOUNTER — Ambulatory Visit (HOSPITAL_COMMUNITY): Payer: Medicare Other

## 2014-06-06 DIAGNOSIS — L820 Inflamed seborrheic keratosis: Secondary | ICD-10-CM | POA: Diagnosis not present

## 2014-06-22 DIAGNOSIS — H409 Unspecified glaucoma: Secondary | ICD-10-CM | POA: Diagnosis not present

## 2014-06-22 DIAGNOSIS — H401490 Capsular glaucoma with pseudoexfoliation of lens, unspecified eye, stage unspecified: Secondary | ICD-10-CM | POA: Diagnosis not present

## 2014-06-27 ENCOUNTER — Other Ambulatory Visit (HOSPITAL_COMMUNITY): Payer: Medicare Other

## 2014-06-27 ENCOUNTER — Encounter (HOSPITAL_COMMUNITY): Payer: Medicare Other

## 2014-07-13 ENCOUNTER — Other Ambulatory Visit (HOSPITAL_COMMUNITY): Payer: Self-pay | Admitting: Cardiology

## 2014-07-13 DIAGNOSIS — I509 Heart failure, unspecified: Secondary | ICD-10-CM

## 2014-07-18 ENCOUNTER — Ambulatory Visit (HOSPITAL_COMMUNITY)
Admission: RE | Admit: 2014-07-18 | Discharge: 2014-07-18 | Disposition: A | Discharge status: Home / Self Care | Payer: Medicare Other | Source: Ambulatory Visit | Attending: Family Medicine | Admitting: Family Medicine

## 2014-07-18 ENCOUNTER — Ambulatory Visit (HOSPITAL_COMMUNITY)
Admission: RE | Admit: 2014-07-18 | Discharge: 2014-07-18 | Disposition: A | Discharge status: Home / Self Care | Payer: Medicare Other | Source: Ambulatory Visit | Attending: Internal Medicine | Admitting: Internal Medicine

## 2014-07-18 ENCOUNTER — Encounter (HOSPITAL_COMMUNITY): Payer: Self-pay

## 2014-07-18 VITALS — BP 110/68 | HR 68 | Wt 175.1 lb

## 2014-07-18 DIAGNOSIS — I428 Other cardiomyopathies: Secondary | ICD-10-CM | POA: Diagnosis not present

## 2014-07-18 DIAGNOSIS — I10 Essential (primary) hypertension: Secondary | ICD-10-CM | POA: Insufficient documentation

## 2014-07-18 DIAGNOSIS — I509 Heart failure, unspecified: Secondary | ICD-10-CM | POA: Diagnosis not present

## 2014-07-18 DIAGNOSIS — I359 Nonrheumatic aortic valve disorder, unspecified: Secondary | ICD-10-CM | POA: Diagnosis not present

## 2014-07-18 DIAGNOSIS — I5022 Chronic systolic (congestive) heart failure: Secondary | ICD-10-CM | POA: Insufficient documentation

## 2014-07-18 LAB — BASIC METABOLIC PANEL
ANION GAP: 14 (ref 5–15)
BUN: 11 mg/dL (ref 6–23)
CALCIUM: 9.3 mg/dL (ref 8.4–10.5)
CO2: 27 meq/L (ref 19–32)
CREATININE: 0.77 mg/dL (ref 0.50–1.10)
Chloride: 100 mEq/L (ref 96–112)
GFR calc Af Amer: >90 mL/min (ref >90)
GFR, EST NON AFRICAN AMERICAN: 83 mL/min — AB (ref >90)
Glucose, Bld: 97 mg/dL (ref 70–99)
Potassium: 4.1 mEq/L (ref 3.7–5.3)
SODIUM: 141 meq/L (ref 137–147)

## 2014-07-18 LAB — CBC WITH DIFFERENTIAL/PLATELET
BASOS ABS: 0.1 10*3/uL (ref 0.0–0.1)
BASOS PCT: 1 % (ref 0–1)
EOS PCT: 7 % — AB (ref 0–5)
Eosinophils Absolute: 0.7 10*3/uL (ref 0.0–0.7)
HEMATOCRIT: 44.9 % (ref 36.0–46.0)
Hemoglobin: 15.6 g/dL — ABNORMAL HIGH (ref 12.0–15.0)
LYMPHS PCT: 27 % (ref 12–46)
Lymphs Abs: 2.9 10*3/uL (ref 0.7–4.0)
MCH: 30.5 pg (ref 26.0–34.0)
MCHC: 34.7 g/dL (ref 30.0–36.0)
MCV: 87.9 fL (ref 78.0–100.0)
MONO ABS: 0.8 10*3/uL (ref 0.1–1.0)
Monocytes Relative: 8 % (ref 3–12)
Neutro Abs: 6.2 10*3/uL (ref 1.7–7.7)
Neutrophils Relative %: 57 % (ref 43–77)
PLATELETS: 307 10*3/uL (ref 150–400)
RBC: 5.11 MIL/uL (ref 3.87–5.11)
RDW: 13.8 % (ref 11.5–15.5)
WBC: 10.7 10*3/uL — AB (ref 4.0–10.5)

## 2014-07-18 MED ORDER — METOPROLOL SUCCINATE ER 50 MG PO TB24
50.0000 mg | ORAL_TABLET | Freq: Every day | ORAL | Status: DC
Start: 2014-07-18 — End: 2014-11-02

## 2014-07-18 MED ORDER — HYDROCHLOROTHIAZIDE 12.5 MG PO CAPS: 12.5000 mg | ORAL_CAPSULE | Freq: Every day | ORAL | Status: DC

## 2014-07-18 NOTE — Progress Notes (Signed)
Patient ID: Jessica Mcneil, female   DOB: 1941-02-19, 73 y.o.   MRN: [id number]  HPI: Jessica Mcneil is a delightful 73 year old woman with a history of a nonischemic cardiomyopathy which is now recovered. Most recent ejection fraction in 4/12 was 60-65%. She also has a hx of a R lumpectomy for benign dz 2/14.  1. 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   February 19, 2006 cardiac catheterization revealing normal            coronary arteries with an EF of 50%. EF 35-40% by ECHO.      b.   Echo 2008: EF 40-50% with mild diffuse            LV hypokinesis and abnormal left ventricular relaxation.  Mild AI.      c.   Echo 8/09 EF 55-60%. Trivial AI     d.   CPX 4/07, pVO2: 20.6 (105% predicted) slope of 42.7.  pRER 1.09.            There was a mild restriction on her spirometry.     E. Echo 4/12 EF 60-65% grade 1 DD. Mild MR/AI  Echo today EF 45-50% global HK. Grade 1 DD. Mild RV dysfx. Mild AI/TR   Follow up: Returns for routine f/u. Lisinopril 5 stopped in 2013 for SBP ~ 90. Last visit in 8/14 Toprol cut back from 50 qhs to 25 qhs. Reports doing well. Occasional exertional fatigue. No real dyspnea. Can do housework and go to grocery store without a problem. No edema. Started doing yoga. Denies CP, PND, or orthopnea.  Weight at home stable. SBP ~ 110.   ROS: All systems negative except as listed in HPI, PMH and Problem List.  Past Medical History  Diagnosis Date  . CHF (congestive heart failure)     due to non ischemic cardiomyopathy  . HLD (hyperlipidemia)   . HTN (hypertension)   . Overweight(278.02)   . GERD (gastroesophageal reflux disease)   . Depression   . PONV (postoperative nausea and vomiting)     long time ago none recent  . Shortness of breath   . Headache(784.0)     migraines  . Arthritis     Current Outpatient Prescriptions  Medication Sig Dispense Refill  . ALPRAZolam (XANAX) 0.5 MG tablet Take 1 tablet (0.5 mg total) by mouth at bedtime as needed. For  sleep/anxiety  30 tablet  1  . aspirin EC 81 MG tablet Take 81 mg by mouth daily.      . calcium-vitamin D (OSCAL WITH D) 500-200 MG-UNIT per tablet Take 1 tablet by mouth daily.      . colchicine 0.6 MG tablet Take 1 tablet (0.6 mg total) by mouth daily.  30 tablet  5  . FLUoxetine (PROZAC) 20 MG capsule TAKE 1 TABLET (20 MG TOTAL) BY MOUTH DAILY.  90 capsule  3  . hydrochlorothiazide (MICROZIDE) 12.5 MG capsule Take 1 capsule (12.5 mg total) by mouth daily.  90 capsule  3  . HYDROcodone-acetaminophen (NORCO/VICODIN) 5-325 MG per tablet Take 1 tablet by mouth every 6 (six) hours as needed. For migraines      . metoprolol succinate (TOPROL-XL) 25 MG 24 hr tablet Take 1 tablet (25 mg total) by mouth daily. Take with or immediately following a meal.  30 tablet  12  . Multiple Vitamin (MULTIVITAMIN WITH MINERALS) TABS Take 1 tablet by mouth daily.      . Omega-3 Fatty Acids 1200 MG  CAPS Take 1,200 mg by mouth daily.      [name] omeprazole (PRILOSEC) 20 MG capsule Take 20 mg by mouth daily.        No current facility-administered medications for this encounter.   Filed Vitals:   07/18/14 1000  BP: 110/68  Pulse: 68  Weight: 175 lb 1.9 oz (79.434 kg)  SpO2: 95%    PHYSICAL EXAM: General:  Well appearing. No resp difficulty HEENT: normal Neck: supple. JVP 6 Carotids 2+ bilaterally; no bruits. No lymphadenopathy or thryomegaly appreciated. Cor: PMI normal. Regular rate & rhythm. No rubs, murmurs. +s4 Lungs: clear Abdomen: obese soft, nontender, nondistended. No hepatosplenomegaly. No bruits or masses. Good bowel sounds. Extremities: no cyanosis, clubbing, rash, edema Neuro: alert & orientedx3, cranial nerves grossly intact. Moves all 4 extremities w/o difficulty. Affect pleasant.   ASSESSMENT & PLAN:  1) Hx Chronic systolic HF, EF 71-06% on echo today - Echo reviewed personally. EF mildly down from previous. Remains NYHA II - Volume status stable. - Previously unable to tolerate low-dose  ACE-I due to hypotension - Will increase Toprol back to 50 mg daily. Instructed patient to call if any weight gain, SOB or increased DOE. - Will see back in 2 months and consider adding spiro 12.5 daily to get some RAAS inhibition on board. Would repeat echo in 6-9 months to ensure stability.    2) HTN  - Controlled  Glori Bickers MD 11:05 AM

## 2014-07-18 NOTE — Patient Instructions (Signed)
INCREASE Toprol to 50mg  daily  Labs today  Your physician recommends that you schedule a follow-up appointment in: 2 months  Do the following things EVERYDAY: 1) Weigh yourself in the morning before breakfast. Write it down and keep it in a log. 2) Take your medicines as prescribed 3) Eat low salt foods-Limit salt (sodium) to 2000 mg per day.  4) Stay as active as you can everyday 5) Limit all fluids for the day to less than 2 liters 6)

## 2014-07-18 NOTE — Progress Notes (Signed)
Echo Lab  2D Echocardiogram completed.  [place], RDCS 07/18/2014 9:09 AM

## 2014-08-22 DIAGNOSIS — H409 Unspecified glaucoma: Secondary | ICD-10-CM | POA: Diagnosis not present

## 2014-08-22 DIAGNOSIS — H401490 Capsular glaucoma with pseudoexfoliation of lens, unspecified eye, stage unspecified: Secondary | ICD-10-CM | POA: Diagnosis not present

## 2014-08-25 ENCOUNTER — Other Ambulatory Visit: Payer: Self-pay | Admitting: Family Medicine

## 2014-09-19 ENCOUNTER — Ambulatory Visit (HOSPITAL_COMMUNITY)
Admission: RE | Admit: 2014-09-19 | Discharge: 2014-09-19 | Disposition: A | Discharge status: Home / Self Care | Payer: Medicare Other | Source: Ambulatory Visit | Attending: Cardiology | Admitting: Cardiology

## 2014-09-19 VITALS — BP 106/62 | HR 77 | Wt 174.8 lb

## 2014-09-19 DIAGNOSIS — R9431 Abnormal electrocardiogram [ECG] [EKG]: Secondary | ICD-10-CM | POA: Diagnosis not present

## 2014-09-19 DIAGNOSIS — I5022 Chronic systolic (congestive) heart failure: Secondary | ICD-10-CM | POA: Insufficient documentation

## 2014-09-19 DIAGNOSIS — I10 Essential (primary) hypertension: Secondary | ICD-10-CM | POA: Insufficient documentation

## 2014-09-19 DIAGNOSIS — I428 Other cardiomyopathies: Secondary | ICD-10-CM | POA: Insufficient documentation

## 2014-09-19 DIAGNOSIS — I509 Heart failure, unspecified: Secondary | ICD-10-CM | POA: Diagnosis not present

## 2014-09-19 MED ORDER — SPIRONOLACTONE 25 MG PO TABS: 12.5000 mg | ORAL_TABLET | Freq: Every day | ORAL | Status: DC

## 2014-09-19 NOTE — Patient Instructions (Signed)
Start Spironolactone 12.5 mg (1/2 tab) daily  Labs in 10 days  Your physician recommends that you schedule a follow-up appointment in: 3 months

## 2014-09-19 NOTE — Progress Notes (Signed)
Patient ID: Jessica Mcneil, female   DOB: Dec 03, 1941, 73 y.o.   MRN: [id number]  HPI: Jessica Mcneil is a delightful 73 year old woman with a history of a nonischemic cardiomyopathy. She also has a hx of a R lumpectomy for benign dz 2/14.  1. 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   February 19, 2006 cardiac catheterization revealing normal            coronary arteries with an EF of 50%. EF 35-40% by ECHO.      b.   Echo 2008: EF 40-50% with mild diffuse            LV hypokinesis and abnormal left ventricular relaxation.  Mild AI.      c.   Echo 8/09 EF 55-60%. Trivial AI     d.   CPX 4/07, pVO2: 20.6 (105% predicted) slope of 42.7.  pRER 1.09.            There was a mild restriction on her spirometry.     e. Echo 4/12 EF 60-65% grade 1 DD. Mild MR/AI      f. Echo 7/15 EF 45-50% global HK. Grade 1 DD. Mild RV dysfx. Mild AI/TR   Follow up: Returns for routine f/u. Lisinopril 5 stopped in 2013 for SBP ~ 90. She tolerated increase of Toprol XL from 25 mg daily to 50 mg daily at last visit. Reports doing well. Occasional exertional fatigue. Mild dyspnea walking up steps. Can do housework and go to grocery store without a problem. No edema.  Denies CP, PND, or orthopnea.    ECG: NSR, left axis deviation, inferior and anterior Qs  Labs (9/15): K 4.1, creatinine 0.77, HCT 44.9  ROS: All systems negative except as listed in HPI, PMH and Problem List.  Past Medical History  Diagnosis Date  . CHF (congestive heart failure)     due to non ischemic cardiomyopathy  . HLD (hyperlipidemia)   . HTN (hypertension)   . Overweight(278.02)   . GERD (gastroesophageal reflux disease)   . Depression   . PONV (postoperative nausea and vomiting)     long time ago none recent  . Shortness of breath   . Headache(784.0)     migraines  . Arthritis     Current Outpatient Prescriptions  Medication Sig Dispense Refill  . ALPRAZolam (XANAX) 0.5 MG tablet Take 1 tablet (0.5 mg total) by mouth at  bedtime as needed. For sleep/anxiety  30 tablet  1  . aspirin EC 81 MG tablet Take 81 mg by mouth daily.      . calcium-vitamin D (OSCAL WITH D) 500-200 MG-UNIT per tablet Take 1 tablet by mouth daily.      . colchicine 0.6 MG tablet Take 1 tablet (0.6 mg total) by mouth daily.  30 tablet  5  . FLUoxetine (PROZAC) 20 MG capsule TAKE 1 TABLET (20 MG TOTAL) BY MOUTH DAILY.  90 capsule  3  . hydrochlorothiazide (MICROZIDE) 12.5 MG capsule Take 1 capsule (12.5 mg total) by mouth daily.  90 capsule  3  . HYDROcodone-acetaminophen (NORCO/VICODIN) 5-325 MG per tablet Take 1 tablet by mouth every 6 (six) hours as needed. For migraines      . metoprolol succinate (TOPROL-XL) 50 MG 24 hr tablet Take 1 tablet (50 mg total) by mouth daily. Take with or immediately following a meal.  30 tablet  12  . Multiple Vitamin (MULTIVITAMIN WITH MINERALS) TABS Take 1 tablet by mouth daily.      [name]  Omega-3 Fatty Acids 1200 MG CAPS Take 1,200 mg by mouth daily.      [name] omeprazole (PRILOSEC) 20 MG capsule Take 20 mg by mouth daily.       [name] spironolactone (ALDACTONE) 25 MG tablet Take 0.5 tablets (12.5 mg total) by mouth daily.  15 tablet  3   No current facility-administered medications for this encounter.   Filed Vitals:   09/19/14 1346  BP: 106/62  Pulse: 77  Weight: 174 lb 12 oz (79.266 kg)  SpO2: 96%    PHYSICAL EXAM: General:  Well appearing. No resp difficulty HEENT: normal Neck: supple. JVP 6 Carotids 2+ bilaterally; no bruits. No lymphadenopathy or thryomegaly appreciated. Cor: PMI normal. Regular rate & rhythm. No rubs, murmurs. +s4 Lungs: clear Abdomen: obese soft, nontender, nondistended. No hepatosplenomegaly. No bruits or masses. Good bowel sounds. Extremities: no cyanosis, clubbing, rash, edema Neuro: alert & orientedx3, cranial nerves grossly intact. Moves all 4 extremities w/o difficulty. Affect pleasant.   ASSESSMENT & PLAN:  1) Chronic systolic HF: Nonischemic cardiomyopathy, EF 45-50% in  7/15.  NYHA class II symptoms. She is not volume overloaded on exam.  - Previously unable to tolerate low-dose ACE-I due to hypotension - Continue Toprol XL and add spironolactone 12.5 mg daily.  BMET in 10 days.  - Followup in 3 months, repeat echo in 6 monts.   2) HTN: Controlled  Followup in 3 months.   Loralie Mcneil 09/20/2014

## 2014-09-27 ENCOUNTER — Ambulatory Visit (HOSPITAL_COMMUNITY)
Admission: RE | Admit: 2014-09-27 | Discharge: 2014-09-27 | Disposition: A | Discharge status: Home / Self Care | Payer: Medicare Other | Source: Ambulatory Visit | Attending: Internal Medicine | Admitting: Internal Medicine

## 2014-09-27 DIAGNOSIS — I5022 Chronic systolic (congestive) heart failure: Secondary | ICD-10-CM

## 2014-09-27 LAB — BASIC METABOLIC PANEL
ANION GAP: 11 (ref 5–15)
BUN: 15 mg/dL (ref 6–23)
CALCIUM: 9.2 mg/dL (ref 8.4–10.5)
CO2: 27 mEq/L (ref 19–32)
Chloride: 99 mEq/L (ref 96–112)
Creatinine, Ser: 0.82 mg/dL (ref 0.50–1.10)
GFR calc Af Amer: 81 mL/min — ABNORMAL LOW (ref >90)
GFR, EST NON AFRICAN AMERICAN: 70 mL/min — AB (ref >90)
Glucose, Bld: 145 mg/dL — ABNORMAL HIGH (ref 70–99)
Potassium: 4.4 mEq/L (ref 3.7–5.3)
SODIUM: 137 meq/L (ref 137–147)

## 2014-09-29 ENCOUNTER — Encounter (HOSPITAL_COMMUNITY): Payer: Self-pay | Admitting: Cardiology

## 2014-10-11 DIAGNOSIS — Z23 Encounter for immunization: Secondary | ICD-10-CM | POA: Diagnosis not present

## 2014-10-25 ENCOUNTER — Telehealth: Payer: Self-pay | Admitting: Family Medicine

## 2014-10-25 MED ORDER — COLCHICINE 0.6 MG PO TABS: 0.6000 mg | ORAL_TABLET | Freq: Every day | ORAL | Status: DC

## 2014-10-25 NOTE — Telephone Encounter (Signed)
[id number] - [place], [place] [address]. AT [place] is requesting re-fill on colchicine 0.6 MG tablet

## 2014-10-25 NOTE — Telephone Encounter (Signed)
Rx sent to pharmacy   

## 2014-10-27 ENCOUNTER — Telehealth: Payer: Self-pay | Admitting: Family Medicine

## 2014-10-27 MED ORDER — FLUOXETINE HCL 20 MG PO CAPS: ORAL_CAPSULE | ORAL | Status: DC

## 2014-10-27 NOTE — Telephone Encounter (Signed)
[id number] - [place], [place] - [address]. AT [place] is requesting 90 day re-fill on FLUoxetine (PROZAC) 20 MG capsule

## 2014-10-27 NOTE — Telephone Encounter (Signed)
Rx sent to pharmacy   

## 2014-10-28 ENCOUNTER — Other Ambulatory Visit: Payer: Self-pay

## 2014-10-28 MED ORDER — SPIRONOLACTONE 25 MG PO TABS: 12.5000 mg | ORAL_TABLET | Freq: Every day | ORAL | Status: DC

## 2014-11-02 ENCOUNTER — Other Ambulatory Visit (HOSPITAL_COMMUNITY): Payer: Self-pay | Admitting: Cardiology

## 2014-11-02 DIAGNOSIS — I10 Essential (primary) hypertension: Secondary | ICD-10-CM

## 2014-11-02 MED ORDER — METOPROLOL SUCCINATE ER 50 MG PO TB24: 50.0000 mg | ORAL_TABLET | Freq: Every day | ORAL | Status: DC

## 2014-11-28 DIAGNOSIS — M7541 Impingement syndrome of right shoulder: Secondary | ICD-10-CM | POA: Diagnosis not present

## 2014-12-05 ENCOUNTER — Other Ambulatory Visit (HOSPITAL_COMMUNITY): Payer: Self-pay | Admitting: Cardiology

## 2014-12-05 MED ORDER — OMEPRAZOLE 20 MG PO CPDR: 20.0000 mg | DELAYED_RELEASE_CAPSULE | Freq: Every day | ORAL | Status: DC

## 2014-12-07 ENCOUNTER — Encounter (HOSPITAL_COMMUNITY): Payer: Medicare Other

## 2014-12-29 ENCOUNTER — Ambulatory Visit (HOSPITAL_COMMUNITY)
Admission: RE | Admit: 2014-12-29 | Discharge: 2014-12-29 | Disposition: A | Discharge status: Home / Self Care | Payer: Medicare Other | Source: Ambulatory Visit | Attending: Internal Medicine | Admitting: Internal Medicine

## 2014-12-29 ENCOUNTER — Encounter (HOSPITAL_COMMUNITY): Payer: Medicare Other | Admitting: Internal Medicine

## 2014-12-29 VITALS — BP 86/62 | HR 81 | Wt 166.0 lb

## 2014-12-29 DIAGNOSIS — I5022 Chronic systolic (congestive) heart failure: Secondary | ICD-10-CM

## 2014-12-29 DIAGNOSIS — I158 Other secondary hypertension: Secondary | ICD-10-CM | POA: Diagnosis not present

## 2014-12-29 DIAGNOSIS — Z87891 Personal history of nicotine dependence: Secondary | ICD-10-CM | POA: Diagnosis not present

## 2014-12-29 DIAGNOSIS — I10 Essential (primary) hypertension: Secondary | ICD-10-CM | POA: Insufficient documentation

## 2014-12-29 LAB — BASIC METABOLIC PANEL
Anion gap: 8 (ref 5–15)
BUN: 15 mg/dL (ref 6–23)
CALCIUM: 9.2 mg/dL (ref 8.4–10.5)
CO2: 25 mmol/L (ref 19–32)
Chloride: 103 mEq/L (ref 96–112)
Creatinine, Ser: 0.93 mg/dL (ref 0.50–1.10)
GFR calc Af Amer: 69 mL/min — ABNORMAL LOW (ref >90)
GFR calc non Af Amer: 60 mL/min — ABNORMAL LOW (ref >90)
GLUCOSE: 98 mg/dL (ref 70–99)
Potassium: 4.2 mmol/L (ref 3.5–5.1)
Sodium: 136 mmol/L (ref 135–145)

## 2014-12-29 MED ORDER — HYDROCHLOROTHIAZIDE 12.5 MG PO CAPS: 12.5000 mg | ORAL_CAPSULE | Freq: Every day | ORAL | Status: DC | PRN

## 2014-12-29 NOTE — Progress Notes (Signed)
Patient ID: Jessica Mcneil, female   DOB: 08-Jun-1941, 74 y.o.   MRN: [id number]  HPI: Jessica Mcneil is a delightful 74 year old woman with a history of a nonischemic cardiomyopathy. She also has a hx of a R lumpectomy for benign dz 2/14.  1. 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   February 19, 2006 cardiac catheterization revealing normal            coronary arteries with an EF of 50%. EF 35-40% by ECHO.      b.   Echo 2008: EF 40-50% with mild diffuse            LV hypokinesis and abnormal left ventricular relaxation.  Mild AI.      c.   Echo 8/09 EF 55-60%. Trivial AI     d.   CPX 4/07, pVO2: 20.6 (105% predicted) slope of 42.7.  pRER 1.09.            There was a mild restriction on her spirometry.     e. Echo 4/12 EF 60-65% grade 1 DD. Mild MR/AI      f. Echo 7/15 EF 45-50% global HK. Grade 1 DD. Mild RV dysfx. Mild AI/TR   Follow up: Last visit she started on 12.5 mg spironolactone. Denies SOB/PND/Orthopnea. SBP at home 100. She has had not problems with dizziness. Able to walk around the grocery store does admit to fatigue after putting groceries away. Not exercising. Taking all medications.    Labs (9/15): K 4.1, creatinine 0.77, HCT 44.9 Labs 09/27/14; k 4.4 Creatinine 0.82   ROS: All systems negative except as listed in HPI, PMH and Problem List.  Past Medical History  Diagnosis Date  . CHF (congestive heart failure)     due to non ischemic cardiomyopathy  . HLD (hyperlipidemia)   . HTN (hypertension)   . Overweight(278.02)   . GERD (gastroesophageal reflux disease)   . Depression   . PONV (postoperative nausea and vomiting)     long time ago none recent  . Shortness of breath   . Headache(784.0)     migraines  . Arthritis     Current Outpatient Prescriptions  Medication Sig Dispense Refill  . ALPRAZolam (XANAX) 0.5 MG tablet Take 1 tablet (0.5 mg total) by mouth at bedtime as needed. For sleep/anxiety 30 tablet 1  . aspirin EC 81 MG tablet Take 81 mg by  mouth daily.    . calcium-vitamin D (OSCAL WITH D) 500-200 MG-UNIT per tablet Take 1 tablet by mouth daily.    . colchicine 0.6 MG tablet Take 1 tablet (0.6 mg total) by mouth daily. 30 tablet 5  . FLUoxetine (PROZAC) 20 MG capsule TAKE 1 TABLET (20 MG TOTAL) BY MOUTH DAILY. 90 capsule 0  . hydrochlorothiazide (MICROZIDE) 12.5 MG capsule Take 1 capsule (12.5 mg total) by mouth daily. 90 capsule 3  . HYDROcodone-acetaminophen (NORCO/VICODIN) 5-325 MG per tablet Take 1 tablet by mouth every 6 (six) hours as needed. For migraines    . metoprolol succinate (TOPROL-XL) 50 MG 24 hr tablet Take 1 tablet (50 mg total) by mouth daily. Take with or immediately following a meal. 90 tablet 3  . Multiple Vitamin (MULTIVITAMIN WITH MINERALS) TABS Take 1 tablet by mouth daily.    . Omega-3 Fatty Acids 1200 MG CAPS Take 1,200 mg by mouth daily.    [name] omeprazole (PRILOSEC) 20 MG capsule Take 1 capsule (20 mg total) by mouth daily. 30 capsule 2  . spironolactone (  ALDACTONE) 25 MG tablet Take 0.5 tablets (12.5 mg total) by mouth daily. 15 tablet 3   No current facility-administered medications for this encounter.   Filed Vitals:   12/29/14 1351  BP: 86/62  Pulse: 81  Weight: 166 lb (75.297 kg)  SpO2: 96%    PHYSICAL EXAM: General:  Well appearing. No resp difficulty HEENT: normal Neck: supple. JVP 5-6 Carotids 2+ bilaterally; no bruits. No lymphadenopathy or thryomegaly appreciated. Cor: PMI normal. Regular rate & rhythm. No rubs, murmurs.  Lungs: clear Abdomen: obese soft, nontender, nondistended. No hepatosplenomegaly. No bruits or masses. Good bowel sounds. Extremities: no cyanosis, clubbing, rash, edema Neuro: alert & orientedx3, cranial nerves grossly intact. Moves all 4 extremities w/o difficulty. Affect pleasant.   ASSESSMENT & PLAN:  1) Chronic systolic HF: Nonischemic cardiomyopathy, EF 45-50% in 7/15.  NYHA class II symptoms.  Volume status stable.   - Previously unable to tolerate  low-dose ACE-I due to hypotension - Continue Toprol XL 50 mg daily. Continue spironolactone 12.5 mg daily. Stop HCTZ and change to as needed for lower extremity edema.    Check BMET.  Repeat echo next visit.  I have asked her to start walking daily.  2) HTN: SBP low. As above stop hctz. I have asked her to monitor SBP at home and call back if SBP remains less than 100.   Follow up in 3 months with an ECHO.   Jessica Shrewsbury NP-C  12/29/2014

## 2014-12-29 NOTE — Patient Instructions (Addendum)
CHANGE Hydrochlorothiazide to AS NEEDED for swelling.  Check blood pressure periodically and call us if it is lower than normal.  Follow up 3 months with echocardiogram.

## 2015-01-02 ENCOUNTER — Other Ambulatory Visit: Payer: Self-pay

## 2015-01-02 DIAGNOSIS — Z1231 Encounter for screening mammogram for malignant neoplasm of breast: Secondary | ICD-10-CM

## 2015-01-19 ENCOUNTER — Other Ambulatory Visit (HOSPITAL_COMMUNITY): Payer: Self-pay | Admitting: Cardiology

## 2015-01-27 ENCOUNTER — Other Ambulatory Visit: Payer: Self-pay | Admitting: Family Medicine

## 2015-01-30 ENCOUNTER — Other Ambulatory Visit: Payer: Self-pay | Admitting: Family Medicine

## 2015-02-02 ENCOUNTER — Ambulatory Visit: Payer: Medicare Other

## 2015-02-09 ENCOUNTER — Ambulatory Visit
Admission: RE | Admit: 2015-02-09 | Discharge: 2015-02-09 | Disposition: A | Discharge status: Home / Self Care | Payer: Medicare Other | Source: Ambulatory Visit

## 2015-02-09 DIAGNOSIS — Z1231 Encounter for screening mammogram for malignant neoplasm of breast: Secondary | ICD-10-CM | POA: Diagnosis not present

## 2015-02-27 ENCOUNTER — Other Ambulatory Visit (HOSPITAL_COMMUNITY): Payer: Self-pay | Admitting: *Deleted

## 2015-02-27 MED ORDER — OMEPRAZOLE 20 MG PO CPDR: 20.0000 mg | DELAYED_RELEASE_CAPSULE | Freq: Every day | ORAL | Status: DC

## 2015-03-15 DIAGNOSIS — H401111 Primary open-angle glaucoma, right eye, mild stage: Secondary | ICD-10-CM | POA: Insufficient documentation

## 2015-03-15 DIAGNOSIS — H4011X1 Primary open-angle glaucoma, mild stage: Secondary | ICD-10-CM | POA: Diagnosis not present

## 2015-03-15 DIAGNOSIS — H401423 Capsular glaucoma with pseudoexfoliation of lens, left eye, severe stage: Secondary | ICD-10-CM | POA: Diagnosis not present

## 2015-03-30 DIAGNOSIS — M7541 Impingement syndrome of right shoulder: Secondary | ICD-10-CM | POA: Diagnosis not present

## 2015-05-01 ENCOUNTER — Other Ambulatory Visit: Payer: Self-pay | Admitting: Family Medicine

## 2015-05-05 ENCOUNTER — Other Ambulatory Visit: Payer: Self-pay | Admitting: Family Medicine

## 2015-05-08 ENCOUNTER — Encounter: Payer: Self-pay | Admitting: Family Medicine

## 2015-05-08 ENCOUNTER — Ambulatory Visit (INDEPENDENT_AMBULATORY_CARE_PROVIDER_SITE_OTHER): Payer: Medicare Other | Admitting: Family Medicine

## 2015-05-08 ENCOUNTER — Other Ambulatory Visit (HOSPITAL_COMMUNITY): Payer: Self-pay | Admitting: Internal Medicine

## 2015-05-08 VITALS — BP 100/76 | HR 83 | Temp 98.3°F | Ht 67.0 in | Wt 174.4 lb

## 2015-05-08 DIAGNOSIS — K5909 Other constipation: Secondary | ICD-10-CM

## 2015-05-08 DIAGNOSIS — Z8659 Personal history of other mental and behavioral disorders: Secondary | ICD-10-CM

## 2015-05-08 DIAGNOSIS — E785 Hyperlipidemia, unspecified: Secondary | ICD-10-CM

## 2015-05-08 DIAGNOSIS — G43109 Migraine with aura, not intractable, without status migrainosus: Secondary | ICD-10-CM | POA: Diagnosis not present

## 2015-05-08 DIAGNOSIS — G4700 Insomnia, unspecified: Secondary | ICD-10-CM

## 2015-05-08 LAB — LIPID PANEL
CHOLESTEROL: 275 mg/dL — AB (ref 0–200)
HDL: 54.3 mg/dL (ref >39.00)
LDL Cholesterol: 195 mg/dL — ABNORMAL HIGH (ref 0–99)
NonHDL: 220.7
Total CHOL/HDL Ratio: 5
Triglycerides: 131 mg/dL (ref 0.0–149.0)
VLDL: 26.2 mg/dL (ref 0.0–40.0)

## 2015-05-08 LAB — TSH: TSH: 1.99 u[IU]/mL (ref 0.35–4.50)

## 2015-05-08 MED ORDER — ALPRAZOLAM 0.5 MG PO TABS: 0.5000 mg | ORAL_TABLET | Freq: Every evening | ORAL | Status: DC | PRN

## 2015-05-08 MED ORDER — FLUOXETINE HCL 20 MG PO CAPS: 20.0000 mg | ORAL_CAPSULE | Freq: Every day | ORAL | Status: DC

## 2015-05-08 MED ORDER — HYDROCODONE-ACETAMINOPHEN 5-325 MG PO TABS: 1.0000 | ORAL_TABLET | Freq: Four times a day (QID) | ORAL | Status: DC | PRN

## 2015-05-08 NOTE — Progress Notes (Signed)
Subjective:    Patient ID: Jessica Mcneil, female    DOB: Jan 27, 1941, 74 y.o.   MRN: [id number]  HPI Patient has history of hyperlipidemia, recurrent depression, nonischemic cardiomyopathy, and migraine headaches. She is followed through heart failure clinic. She remains on metoprolol, Aldactone, and as needed HCTZ. She also has history of gout but no recent flareups. We had her on losartan  but apparently had some hypotension.  No recent migraines. Trigger includes changes in barometric pressure. Migraines are becoming less frequent over time. She has had previous intolerance with Imitrex. Takes hydrocodone infrequently for headache  Patient requesting TSH. Mother had hypothyroidism. She has some constipation issues. No cold intolerance  History of hyperlipidemia but good HDL. No history of CAD or perform ask her disease.  Past Medical History  Diagnosis Date  . CHF (congestive heart failure)     due to non ischemic cardiomyopathy  . HLD (hyperlipidemia)   . HTN (hypertension)   . Overweight(278.02)   . GERD (gastroesophageal reflux disease)   . Depression   . PONV (postoperative nausea and vomiting)     long time ago none recent  . Shortness of breath   . Headache(784.0)     migraines  . Arthritis    Past Surgical History  Procedure Laterality Date  . Cholecystectomy    . Vaginal hysterectomy  1998  . Anterior and posterior repair  03  . Tonsillectomy    . Appendectomy    . Colonoscopy    . Belpharoptosis repair      bilat  . Breast lumpectomy with needle localization Right 02/12/2013    Procedure: BREAST LUMPECTOMY WITH NEEDLE LOCALIZATION;  Surgeon: Merrie Roof, MD;  Location: [place];  Service: General;  Laterality: Right;  . Eye surgery      Glaucoma Implant    reports that she quit smoking about 32 years ago. Her smoking use included Cigarettes. She smoked 2.00 packs per day. She does not have any smokeless tobacco history on file. She  reports that she does not drink alcohol or use illicit drugs. family history includes Arthritis in an other family member; Heart disease in her father; Hyperlipidemia in an other family member. Allergies  Allergen Reactions  . Codeine     REACTION: itiching  . Erythromycin     REACTION: GI upset  . Sulfamethoxazole Itching    Over 40 years ago      Review of Systems  Constitutional: Negative for fatigue.  Eyes: Negative for visual disturbance.  Respiratory: Negative for cough, chest tightness, shortness of breath and wheezing.   Cardiovascular: Negative for chest pain, palpitations and leg swelling.  Gastrointestinal: Positive for constipation.  Endocrine: Negative for polydipsia and polyuria.  Neurological: Negative for dizziness, seizures, syncope, weakness, light-headedness and headaches.       Objective:   Physical Exam  Constitutional: She appears well-developed and well-nourished.  Neck: Neck supple. No JVD present.  Cardiovascular: Normal rate and regular rhythm.  Exam reveals no gallop.   Pulmonary/Chest: Effort normal and breath sounds normal. No respiratory distress. She has no wheezes. She has no rales.  Musculoskeletal: She exhibits no edema.          Assessment & Plan:  #1 history of recurrent depression. Stable. Refill Prozac for one year #2 history of transient insomnia. Patient rarely takes alprazolam as needed for severe insomnia. 1 refill given.  Avoid regular use. #3 history of migraine headaches. These are very infrequent. Previous intolerance with  tryptans. 1 refill hydrocodone for sparing use  #4 history of constipation. Positive family history of hypothyroidism. Recheck TSH  #5 hyperlipidemia. Patient requesting fasting lipid panel. Lipids obtained

## 2015-05-08 NOTE — Progress Notes (Signed)
Pre visit review using our clinic review tool, if applicable. No additional management support is needed unless otherwise documented below in the visit note. 

## 2015-05-11 ENCOUNTER — Other Ambulatory Visit: Payer: Self-pay | Admitting: Family Medicine

## 2015-05-11 ENCOUNTER — Other Ambulatory Visit: Payer: Self-pay

## 2015-05-11 ENCOUNTER — Telehealth: Payer: Self-pay | Admitting: Family Medicine

## 2015-05-11 DIAGNOSIS — E785 Hyperlipidemia, unspecified: Secondary | ICD-10-CM

## 2015-05-11 MED ORDER — ATORVASTATIN CALCIUM 20 MG PO TABS: 20.0000 mg | ORAL_TABLET | Freq: Every day | ORAL | Status: DC

## 2015-05-11 NOTE — Telephone Encounter (Signed)
Pt returning your call

## 2015-05-11 NOTE — Telephone Encounter (Signed)
Pt informed

## 2015-05-23 DIAGNOSIS — H401423 Capsular glaucoma with pseudoexfoliation of lens, left eye, severe stage: Secondary | ICD-10-CM | POA: Diagnosis not present

## 2015-06-04 ENCOUNTER — Other Ambulatory Visit (HOSPITAL_COMMUNITY): Payer: Self-pay | Admitting: Internal Medicine

## 2015-06-14 DIAGNOSIS — H4011X1 Primary open-angle glaucoma, mild stage: Secondary | ICD-10-CM | POA: Diagnosis not present

## 2015-06-14 DIAGNOSIS — H401423 Capsular glaucoma with pseudoexfoliation of lens, left eye, severe stage: Secondary | ICD-10-CM | POA: Diagnosis not present

## 2015-06-16 ENCOUNTER — Encounter: Payer: Self-pay | Admitting: Family Medicine

## 2015-06-16 ENCOUNTER — Ambulatory Visit (INDEPENDENT_AMBULATORY_CARE_PROVIDER_SITE_OTHER): Payer: Medicare Other | Admitting: Family Medicine

## 2015-06-16 VITALS — BP 108/76 | HR 88 | Temp 97.2°F | Wt 177.0 lb

## 2015-06-16 DIAGNOSIS — R519 Headache, unspecified: Secondary | ICD-10-CM

## 2015-06-16 DIAGNOSIS — R51 Headache: Secondary | ICD-10-CM

## 2015-06-16 NOTE — Progress Notes (Signed)
   Subjective:    Patient ID: Jessica Mcneil, female    DOB: 1940/12/27, 74 y.o.   MRN: [id number]  HPI Patient seen with mild right facial swelling and tenderness over the past few days. She's had some recent TMJ issues. She has not noted any erythema or warmth. She's had some mild peri-auricular pains on the right side.  Denies any fever, chills, headaches, or skin rash. No sore throat  Past Medical History  Diagnosis Date  . CHF (congestive heart failure)     due to non ischemic cardiomyopathy  . HLD (hyperlipidemia)   . HTN (hypertension)   . Overweight(278.02)   . GERD (gastroesophageal reflux disease)   . Depression   . PONV (postoperative nausea and vomiting)     long time ago none recent  . Shortness of breath   . Headache(784.0)     migraines  . Arthritis    Past Surgical History  Procedure Laterality Date  . Cholecystectomy    . Vaginal hysterectomy  1998  . Anterior and posterior repair  03  . Tonsillectomy    . Appendectomy    . Colonoscopy    . Belpharoptosis repair      bilat  . Breast lumpectomy with needle localization Right 02/12/2013    Procedure: BREAST LUMPECTOMY WITH NEEDLE LOCALIZATION;  Surgeon: Merrie Roof, MD;  Location: [place];  Service: General;  Laterality: Right;  . Eye surgery      Glaucoma Implant    reports that she quit smoking about 32 years ago. Her smoking use included Cigarettes. She smoked 2.00 packs per day. She does not have any smokeless tobacco history on file. She reports that she does not drink alcohol or use illicit drugs. family history includes Arthritis in an other family member; Heart disease in her father; Hyperlipidemia in an other family member. Allergies  Allergen Reactions  . Codeine     REACTION: itiching  . Erythromycin     REACTION: GI upset  . Sulfamethoxazole Itching    Over 40 years ago      Review of Systems  Constitutional: Negative for fever and chills.  HENT: Negative for sore  throat.   Respiratory: Negative for shortness of breath.   Cardiovascular: Negative for chest pain.  Neurological: Negative for headaches.       Objective:   Physical Exam  Constitutional: She appears well-developed and well-nourished.  HENT:  Right Ear: External ear normal.  Left Ear: External ear normal.  Mouth/Throat: Oropharynx is clear and moist.  No obvious facial swelling. She has minimal tenderness over the right TMJ joint. No parotid swelling or masses. No overlying erythema or warmth over the parotid region.  Neck: Neck supple.  Cardiovascular: Normal rate.   Pulmonary/Chest: Effort normal and breath sounds normal. No respiratory distress. She has no wheezes. She has no rales.  Lymphadenopathy:    She has no cervical adenopathy.          Assessment & Plan:  Right facial pain. Question TMJ symptoms. 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 No evidence for parotid involvement. We've recommended starting back her mouth guard at night. Consider icing as needed for symptom relief. Follow-up promptly for any redness, warmth or any change of symptoms

## 2015-06-16 NOTE — Progress Notes (Signed)
Pre visit review using our clinic review tool, if applicable. No additional management support is needed unless otherwise documented below in the visit note. 

## 2015-06-16 NOTE — Patient Instructions (Signed)
Consider using mouthguard at night. Consider using ice pack for any facial swelling or pain. Follow up for any signs of infection such as redness or fever/warmth.

## 2015-08-15 DIAGNOSIS — M7541 Impingement syndrome of right shoulder: Secondary | ICD-10-CM | POA: Diagnosis not present

## 2015-09-02 ENCOUNTER — Other Ambulatory Visit (HOSPITAL_COMMUNITY): Payer: Self-pay | Admitting: Internal Medicine

## 2015-09-04 ENCOUNTER — Ambulatory Visit (HOSPITAL_COMMUNITY)
Admission: RE | Admit: 2015-09-04 | Discharge: 2015-09-04 | Disposition: A | Discharge status: Home / Self Care | Payer: Medicare Other | Source: Ambulatory Visit | Attending: Internal Medicine | Admitting: Internal Medicine

## 2015-09-04 ENCOUNTER — Encounter (HOSPITAL_COMMUNITY): Payer: Self-pay

## 2015-09-04 ENCOUNTER — Ambulatory Visit (HOSPITAL_BASED_OUTPATIENT_CLINIC_OR_DEPARTMENT_OTHER)
Admission: RE | Admit: 2015-09-04 | Discharge: 2015-09-04 | Disposition: A | Discharge status: Home / Self Care | Payer: Medicare Other | Source: Ambulatory Visit | Attending: Internal Medicine | Admitting: Internal Medicine

## 2015-09-04 VITALS — BP 128/70 | HR 77 | Wt 177.5 lb

## 2015-09-04 DIAGNOSIS — E785 Hyperlipidemia, unspecified: Secondary | ICD-10-CM | POA: Insufficient documentation

## 2015-09-04 DIAGNOSIS — I10 Essential (primary) hypertension: Secondary | ICD-10-CM | POA: Diagnosis not present

## 2015-09-04 DIAGNOSIS — I5043 Acute on chronic combined systolic (congestive) and diastolic (congestive) heart failure: Secondary | ICD-10-CM | POA: Diagnosis not present

## 2015-09-04 DIAGNOSIS — I509 Heart failure, unspecified: Secondary | ICD-10-CM | POA: Insufficient documentation

## 2015-09-04 DIAGNOSIS — I420 Dilated cardiomyopathy: Secondary | ICD-10-CM | POA: Diagnosis not present

## 2015-09-04 DIAGNOSIS — I351 Nonrheumatic aortic (valve) insufficiency: Secondary | ICD-10-CM | POA: Diagnosis not present

## 2015-09-04 DIAGNOSIS — I5020 Unspecified systolic (congestive) heart failure: Secondary | ICD-10-CM | POA: Insufficient documentation

## 2015-09-04 NOTE — Addendum Note (Signed)
Encounter addended by: Scarlette Calico, RN on: 09/04/2015  1:09 PM<BR>     Documentation filed: Patient Instructions Section

## 2015-09-04 NOTE — Patient Instructions (Signed)
You have been referred to Dr Meda Coffee and [facility], they will call you in 6 months to schedule an appointment

## 2015-09-04 NOTE — Progress Notes (Signed)
Advanced Heart Failure Medication Review by a Pharmacist  Does the patient  feel that his/her medications are working for him/her?  yes  Has the patient been experiencing any side effects to the medications prescribed?  yes  Does the patient measure his/her own blood pressure or blood glucose at home?  yes   Does the patient have any problems obtaining medications due to transportation or finances?   no  Understanding of regimen: good Understanding of indications: good Potential of compliance: good    Pharmacist comments:  Jessica Mcneil is a pleasant 74 yo F presenting without a medication list but with excellent recall of all of her medications. She does report not taking her atorvastatin because she had muscle aches with it in the past and instead is using garlic supplements. She states that she last used hctz about a week ago for edema. She also asked if we could send in 90 day supplies of her medications from here on out.   [name] Velva Harman, PharmD, BCPS, CPP Clinical Pharmacist Pager: [phone] Phone: [phone] 09/04/2015 12:31 PM

## 2015-09-04 NOTE — Progress Notes (Signed)
Echocardiogram 2D Echocardiogram has been performed.  Jennette Dubin 09/04/2015, 11:43 AM

## 2015-09-04 NOTE — Progress Notes (Signed)
[facility] CLINIC NOTE  Patient ID: Jessica Mcneil, female   DOB: 07-28-1941, 74 y.o.   MRN: [id number]  HPI: Jessica Mcneil is a delightful 74 year old woman with a history of a nonischemic cardiomyopathy, hyperlipidemia. She also has a hx of a R lumpectomy for benign dz 2/14.  1. 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   February 19, 2006 cardiac catheterization revealing normal            coronary arteries with an EF of 50%. EF 35-40% by ECHO.      b.   Echo 2008: EF 40-50% with mild diffuse            LV hypokinesis and abnormal left ventricular relaxation.  Mild AI.      c.   Echo 8/09 EF 55-60%. Trivial AI     d.   CPX 4/07, pVO2: 20.6 (105% predicted) slope of 42.7.  pRER 1.09.            There was a mild restriction on her spirometry.     e. Echo 4/12 EF 60-65% grade 1 DD. Mild MR/AI      f. Echo 7/15 EF 45-50% global HK. Grade 1 DD. Mild RV dysfx. Mild AI/TR     G. Echo 9/16 EF 50-55% Grade 1 DD. Normal RV Mild AI   Follow up: Doing well. At last visit HCTZ cut back to prn only due to low BP. Has only taken it once recently for ankle swelling. SBP now 120-130s.Feels pretty good but more fatigued. Attributed to heat. Denies DOE. No CP. Weight stable at 177. Dr. Elease Hashimoto recommended but reluctant to take it due to previous muscle aches. (LDL was 195)   Labs (9/15): K 4.1, creatinine 0.77, HCT 44.9 Labs 09/27/14; k 4.4 Creatinine 0.82   ROS: All systems negative except as listed in HPI, PMH and Problem List.  Past Medical History  Diagnosis Date  . CHF (congestive heart failure)     due to non ischemic cardiomyopathy  . HLD (hyperlipidemia)   . HTN (hypertension)   . Overweight(278.02)   . GERD (gastroesophageal reflux disease)   . Depression   . PONV (postoperative nausea and vomiting)     long time ago none recent  . Shortness of breath   . Headache(784.0)     migraines  . Arthritis     Current Outpatient Prescriptions  Medication Sig Dispense Refill  .  ALPRAZolam (XANAX) 0.5 MG tablet Take 1 tablet (0.5 mg total) by mouth at bedtime as needed. For sleep/anxiety 30 tablet 1  . aspirin EC 81 MG tablet Take 81 mg by mouth daily.    [name] CALCIUM-MAGNESIUM-ZINC PO Take 1 tablet by mouth daily.    . colchicine 0.6 MG tablet Take 0.6 mg by mouth daily as needed (gouty flare).    [name] docusate sodium (COLACE) 100 MG capsule Take 200 mg by mouth at bedtime.    [name] FLUoxetine (PROZAC) 20 MG capsule Take 1 capsule (20 mg total) by mouth daily. 90 capsule 3  . GARLIC PO Take 1 tablet by mouth 2 (two) times daily.    [name] HYDROcodone-acetaminophen (NORCO/VICODIN) 5-325 MG per tablet Take 1 tablet by mouth every 6 (six) hours as needed. For migraines 30 tablet 0  . latanoprost (XALATAN) 0.005 % ophthalmic solution Place 1 drop into the right eye at bedtime.     . metoprolol succinate (TOPROL-XL) 50 MG 24 hr tablet Take 1 tablet (50 mg total) by mouth  daily. Take with or immediately following a meal. 90 tablet 3  . Multiple Vitamin (MULTIVITAMIN WITH MINERALS) TABS Take 1 tablet by mouth daily.    . Omega-3 Fatty Acids 1200 MG CAPS Take 1,200 mg by mouth daily.    [name] omeprazole (PRILOSEC) 20 MG capsule TAKE 1 CAPSULE BY MOUTH ONCE DAILY 30 capsule 2  . spironolactone (ALDACTONE) 25 MG tablet TAKE 1/2 TABLET ONCE DAILY 15 tablet 3  . hydrochlorothiazide (MICROZIDE) 12.5 MG capsule Take 1 capsule (12.5 mg total) by mouth daily as needed (Swelling). (Patient not taking: Reported on 09/04/2015) 15 capsule 3   No current facility-administered medications for this encounter.   Filed Vitals:   09/04/15 1216  BP: 128/70  Pulse: 77  Weight: 177 lb 8 oz (80.513 kg)  SpO2: 97%    PHYSICAL EXAM: General:  Well appearing. No resp difficulty HEENT: normal Neck: supple. JVP 5-6 Carotids 2+ bilaterally; no bruits. No lymphadenopathy or thryomegaly appreciated. Cor: PMI normal. Regular rate & rhythm. No rubs, murmurs.  Lungs: clear Abdomen: obese soft, nontender, nondistended.  No hepatosplenomegaly. No bruits or masses. Good bowel sounds. Extremities: no cyanosis, clubbing, rash, edema Neuro: alert & orientedx3, cranial nerves grossly intact. Moves all 4 extremities w/o difficulty. Affect pleasant.   ASSESSMENT & PLAN:  1) Chronic systolic HF: Nonischemic cardiomyopathy. Echo reviewed today personally EF 50-55%. Looks good. No clinical HF. Volume status stable.   - Previously unable to tolerate low-dose ACE-I due to hypotension - Continue Toprol XL 50 mg daily. Continue spironolactone 12.5 mg daily. - Continue HCTZ as needed for lower extremity edema.      2) HTN:  -Blood pressure well controlled. Continue current regimen.  3) HL - Cholesterol very high. Agree with Statin. If unable to tolerate Lipitor. Can start with Crestor 10mg  daily and may be better tolerated.  - Diet and weight loss important.   4) Fatigue - I worry she may have OSA. She will ask her family members if she snores. If so, will refer for sleep study.  Will refer to general cardiology at [facility] for routine f/u.   Jessica Bugh,MD 1:05 PM

## 2015-10-17 ENCOUNTER — Ambulatory Visit (INDEPENDENT_AMBULATORY_CARE_PROVIDER_SITE_OTHER): Payer: Medicare Other | Admitting: Family Medicine

## 2015-10-17 ENCOUNTER — Encounter: Payer: Self-pay | Admitting: Family Medicine

## 2015-10-17 VITALS — BP 110/74 | HR 81 | Temp 97.6°F | Wt 178.5 lb

## 2015-10-17 DIAGNOSIS — R05 Cough: Secondary | ICD-10-CM | POA: Diagnosis not present

## 2015-10-17 DIAGNOSIS — R059 Cough, unspecified: Secondary | ICD-10-CM

## 2015-10-17 MED ORDER — PREDNISONE 20 MG PO TABS: ORAL_TABLET | ORAL | Status: DC

## 2015-10-17 NOTE — Progress Notes (Signed)
   Subjective:    Patient ID: Jessica Mcneil, female    DOB: 09-Jan-1941, 74 y.o.   MRN: [id number]  HPI Acute visit. Patient is ex-smoker who seen with mostly dry cough for the past 4 weeks. She feels she has some wheezing intermittently. No history of asthma. Quit smoking many years ago. She has history of GERD and takes Prilosec but no recent active GERD symptoms. Denies any appetite or weight changes. No fevers or chills. Using Mucinex DM with minimal relief of cough. No dyspnea with exertion. She does have mild systolic heart failure. No recent peripheral edema. Recent EF 50-55%.  Past Medical History  Diagnosis Date  . CHF (congestive heart failure) ([place])     due to non ischemic cardiomyopathy  . HLD (hyperlipidemia)   . HTN (hypertension)   . Overweight(278.02)   . GERD (gastroesophageal reflux disease)   . Depression   . PONV (postoperative nausea and vomiting)     long time ago none recent  . Shortness of breath   . Headache(784.0)     migraines  . Arthritis    Past Surgical History  Procedure Laterality Date  . Cholecystectomy    . Vaginal hysterectomy  1998  . Anterior and posterior repair  03  . Tonsillectomy    . Appendectomy    . Colonoscopy    . Belpharoptosis repair      bilat  . Breast lumpectomy with needle localization Right 02/12/2013    Procedure: BREAST LUMPECTOMY WITH NEEDLE LOCALIZATION;  Surgeon: Merrie Roof, MD;  Location: [place];  Service: General;  Laterality: Right;  . Eye surgery      Glaucoma Implant    reports that she quit smoking about 32 years ago. Her smoking use included Cigarettes. She smoked 2.00 packs per day. She does not have any smokeless tobacco history on file. She reports that she does not drink alcohol or use illicit drugs. family history includes Arthritis in an other family member; Heart disease in her father; Hyperlipidemia in an other family member. Allergies  Allergen Reactions  . Codeine    REACTION: itiching  . Erythromycin     REACTION: GI upset  . Sulfamethoxazole Itching    Over 40 years ago      Review of Systems  Constitutional: Negative for fever, chills, appetite change and unexpected weight change.  HENT: Negative for congestion.   Respiratory: Positive for cough and wheezing. Negative for shortness of breath.   Cardiovascular: Negative for chest pain and leg swelling.       Objective:   Physical Exam  Constitutional: She appears well-developed and well-nourished. No distress.  Neck: Neck supple. No JVD present.  Cardiovascular: Normal rate and regular rhythm.   Pulmonary/Chest: Effort normal. She has no rales.  Few very faint wheezes diffusely. No retractions. Normal respiratory rate. No rales.  Musculoskeletal: She exhibits no edema.          Assessment & Plan:  Cough with associated mild wheeze-suspect viral trigger.. Prednisone 20 mg 2 tablets daily for 6 days. We reviewed potential side effects including to watch out for possible fluid retention. She has tolerated this in the past without difficulty. Stay well-hydrated. Follow-up promptly for any fever or increased shortness of breath. Consider chest x-ray if no better in 1-2 weeks

## 2015-10-17 NOTE — Progress Notes (Signed)
Pre visit review using our clinic review tool, if applicable. No additional management support is needed unless otherwise documented below in the visit note. 

## 2015-10-17 NOTE — Patient Instructions (Signed)
Touch base in one week if cough not better and sooner for any fever or increased shortness of breath.

## 2015-11-15 DIAGNOSIS — H401111 Primary open-angle glaucoma, right eye, mild stage: Secondary | ICD-10-CM | POA: Diagnosis not present

## 2015-11-15 DIAGNOSIS — H401423 Capsular glaucoma with pseudoexfoliation of lens, left eye, severe stage: Secondary | ICD-10-CM | POA: Diagnosis not present

## 2015-11-15 DIAGNOSIS — Z23 Encounter for immunization: Secondary | ICD-10-CM | POA: Diagnosis not present

## 2015-11-22 ENCOUNTER — Other Ambulatory Visit (HOSPITAL_COMMUNITY): Payer: Self-pay | Admitting: Internal Medicine

## 2015-11-29 DIAGNOSIS — M19041 Primary osteoarthritis, right hand: Secondary | ICD-10-CM | POA: Diagnosis not present

## 2015-11-29 DIAGNOSIS — M19042 Primary osteoarthritis, left hand: Secondary | ICD-10-CM | POA: Diagnosis not present

## 2015-12-05 ENCOUNTER — Other Ambulatory Visit (HOSPITAL_COMMUNITY): Payer: Self-pay | Admitting: Internal Medicine

## 2015-12-06 DIAGNOSIS — M19041 Primary osteoarthritis, right hand: Secondary | ICD-10-CM | POA: Diagnosis not present

## 2015-12-06 DIAGNOSIS — M19042 Primary osteoarthritis, left hand: Secondary | ICD-10-CM | POA: Diagnosis not present

## 2015-12-20 ENCOUNTER — Other Ambulatory Visit (HOSPITAL_COMMUNITY): Payer: Self-pay | Admitting: Internal Medicine

## 2015-12-22 ENCOUNTER — Other Ambulatory Visit (HOSPITAL_COMMUNITY): Payer: Self-pay | Admitting: *Deleted

## 2015-12-22 MED ORDER — SPIRONOLACTONE 25 MG PO TABS: 12.5000 mg | ORAL_TABLET | Freq: Every day | ORAL | Status: DC

## 2015-12-22 NOTE — Telephone Encounter (Signed)
REFILL SENT IN FOR SPIRONOLACTONE 12.5 DAILY

## 2016-01-24 DIAGNOSIS — M7541 Impingement syndrome of right shoulder: Secondary | ICD-10-CM | POA: Diagnosis not present

## 2016-02-13 ENCOUNTER — Ambulatory Visit (INDEPENDENT_AMBULATORY_CARE_PROVIDER_SITE_OTHER): Payer: Medicare Other | Admitting: Family Medicine

## 2016-02-13 VITALS — BP 120/84 | HR 83 | Temp 97.7°F | Ht 65.0 in | Wt 176.9 lb

## 2016-02-13 DIAGNOSIS — E785 Hyperlipidemia, unspecified: Secondary | ICD-10-CM | POA: Diagnosis not present

## 2016-02-13 DIAGNOSIS — Z23 Encounter for immunization: Secondary | ICD-10-CM | POA: Diagnosis not present

## 2016-02-13 DIAGNOSIS — I10 Essential (primary) hypertension: Secondary | ICD-10-CM | POA: Diagnosis not present

## 2016-02-13 DIAGNOSIS — Z0000 Encounter for general adult medical examination without abnormal findings: Secondary | ICD-10-CM | POA: Diagnosis not present

## 2016-02-13 DIAGNOSIS — M109 Gout, unspecified: Secondary | ICD-10-CM | POA: Insufficient documentation

## 2016-02-13 DIAGNOSIS — M1000 Idiopathic gout, unspecified site: Secondary | ICD-10-CM | POA: Diagnosis not present

## 2016-02-13 MED ORDER — OMEPRAZOLE 20 MG PO CPDR: 20.0000 mg | DELAYED_RELEASE_CAPSULE | Freq: Every day | ORAL | Status: DC

## 2016-02-13 MED ORDER — ALPRAZOLAM 0.5 MG PO TABS: 0.5000 mg | ORAL_TABLET | Freq: Every evening | ORAL | Status: DC | PRN

## 2016-02-13 NOTE — Patient Instructions (Signed)
Schedule repeat mammogram Check on coverage for shingles vaccine.

## 2016-02-13 NOTE — Progress Notes (Signed)
Subjective:    Patient ID: Jessica Mcneil, female    DOB: Feb 22, 1941, 75 y.o.   MRN: [id number]  HPI Patient seen for physical and medical follow-up  Chronic problems include history of systolic and diastolic heart failure with history of congestive dilated cardiopathy. Nonischemic. She also said history of depression, gout, hypertension, hyperlipidemia. Followed by cardiology. Recent ejection fraction 45-50%.  She needs tetanus booster along with Prevnar 13. She denies prior history of shingles vaccine.  Gout usually flares up about 3-4 times per year. Recent flareup right metatarsophalangeal joint. Takes Colchicine as needed. She is undecided regarding prophylaxis. She is requesting uric acid level.  Hyperlipidemia. Previously took Lipitor but had myalgias within a few days. She does not recall trying any other statins. She had cholesterol checked previously last May.  She's had previous hysterectomy. She gets yearly mammograms. Colonoscopy up-to-date.  Past Medical History  Diagnosis Date  . CHF (congestive heart failure) ([place])     due to non ischemic cardiomyopathy  . HLD (hyperlipidemia)   . HTN (hypertension)   . Overweight(278.02)   . GERD (gastroesophageal reflux disease)   . Depression   . PONV (postoperative nausea and vomiting)     long time ago none recent  . Shortness of breath   . Headache(784.0)     migraines  . Arthritis    Past Surgical History  Procedure Laterality Date  . Cholecystectomy    . Vaginal hysterectomy  1998  . Anterior and posterior repair  03  . Tonsillectomy    . Appendectomy    . Colonoscopy    . Belpharoptosis repair      bilat  . Breast lumpectomy with needle localization Right 02/12/2013    Procedure: BREAST LUMPECTOMY WITH NEEDLE LOCALIZATION;  Surgeon: Merrie Roof, MD;  Location: [place];  Service: General;  Laterality: Right;  . Eye surgery      Glaucoma Implant    reports that she quit smoking about 33  years ago. Her smoking use included Cigarettes. She smoked 2.00 packs per day. She does not have any smokeless tobacco history on file. She reports that she does not drink alcohol or use illicit drugs. family history includes Heart disease in her father. Allergies  Allergen Reactions  . Codeine     REACTION: itiching  . Erythromycin     REACTION: GI upset  . Lipitor [Atorvastatin] Other (See Comments)  . Sulfamethoxazole Itching    Over 40 years ago      Review of Systems  Constitutional: Negative for fever, activity change, appetite change, fatigue and unexpected weight change.  HENT: Negative for ear pain, hearing loss, sore throat and trouble swallowing.   Eyes: Negative for visual disturbance.  Respiratory: Negative for cough and shortness of breath.   Cardiovascular: Negative for chest pain and palpitations.  Gastrointestinal: Negative for abdominal pain, diarrhea, constipation and blood in stool.  Genitourinary: Negative for dysuria and hematuria.  Musculoskeletal: Negative for myalgias, back pain and arthralgias.  Skin: Negative for rash.  Neurological: Negative for dizziness, syncope and headaches.  Hematological: Negative for adenopathy.  Psychiatric/Behavioral: Negative for confusion and dysphoric mood.       Objective:   Physical Exam  Constitutional: She is oriented to person, place, and time. She appears well-developed and well-nourished.  HENT:  Head: Normocephalic and atraumatic.  Eyes: EOM are normal. Pupils are equal, round, and reactive to light.  Neck: Normal range of motion. Neck supple. No thyromegaly present.  Cardiovascular:  Normal rate, regular rhythm and normal heart sounds.   No murmur heard. Pulmonary/Chest: Breath sounds normal. No respiratory distress. She has no wheezes. She has no rales.  Abdominal: Soft. Bowel sounds are normal. She exhibits no distension and no mass. There is no tenderness. There is no rebound and no guarding.    Musculoskeletal: Normal range of motion. She exhibits no edema.  Lymphadenopathy:    She has no cervical adenopathy.  Neurological: She is alert and oriented to person, place, and time. She displays normal reflexes. No cranial nerve deficit.  Skin: No rash noted.  Psychiatric: She has a normal mood and affect. Her behavior is normal. Judgment and thought content normal.          Assessment & Plan:  #1 health maintenance. Tetanus booster and Prevnar 13 given. She will check on coverage for shingles vaccine. Colonoscopy up-to-date. No indication for Pap smears. Continue yearly mammogram  #2 history of gout. Generally has about 3-4 flareups per year. Check uric acid. We discussed possible prophylaxis and pros and cons of that  #3 hyperlipidemia. Previous intolerance with Lipitor. Recheck lipids. Consider Crestor

## 2016-02-13 NOTE — Progress Notes (Signed)
Pre visit review using our clinic review tool, if applicable. No additional management support is needed unless otherwise documented below in the visit note. 

## 2016-02-20 ENCOUNTER — Other Ambulatory Visit (INDEPENDENT_AMBULATORY_CARE_PROVIDER_SITE_OTHER): Payer: Medicare Other

## 2016-02-20 DIAGNOSIS — E785 Hyperlipidemia, unspecified: Secondary | ICD-10-CM

## 2016-02-20 DIAGNOSIS — Z0000 Encounter for general adult medical examination without abnormal findings: Secondary | ICD-10-CM

## 2016-02-20 DIAGNOSIS — M1000 Idiopathic gout, unspecified site: Secondary | ICD-10-CM

## 2016-02-20 DIAGNOSIS — I10 Essential (primary) hypertension: Secondary | ICD-10-CM

## 2016-02-20 LAB — URIC ACID: Uric Acid, Serum: 8.2 mg/dL — ABNORMAL HIGH (ref 2.4–7.0)

## 2016-02-20 LAB — LIPID PANEL
CHOL/HDL RATIO: 5
CHOLESTEROL: 265 mg/dL — AB (ref 0–200)
HDL: 53.5 mg/dL (ref >39.00)
LDL CALC: 182 mg/dL — AB (ref 0–99)
NonHDL: 211.15
TRIGLYCERIDES: 144 mg/dL (ref 0.0–149.0)
VLDL: 28.8 mg/dL (ref 0.0–40.0)

## 2016-02-20 LAB — BASIC METABOLIC PANEL
BUN: 12 mg/dL (ref 6–23)
CHLORIDE: 100 meq/L (ref 96–112)
CO2: 31 meq/L (ref 19–32)
CREATININE: 0.88 mg/dL (ref 0.40–1.20)
Calcium: 9.4 mg/dL (ref 8.4–10.5)
GFR: 66.91 mL/min (ref >60.00)
Glucose, Bld: 121 mg/dL — ABNORMAL HIGH (ref 70–99)
POTASSIUM: 4.9 meq/L (ref 3.5–5.1)
Sodium: 140 mEq/L (ref 135–145)

## 2016-02-20 LAB — CBC WITH DIFFERENTIAL/PLATELET
BASOS PCT: 1.1 % (ref 0.0–3.0)
Basophils Absolute: 0.1 10*3/uL (ref 0.0–0.1)
EOS PCT: 6.2 % — AB (ref 0.0–5.0)
Eosinophils Absolute: 0.4 10*3/uL (ref 0.0–0.7)
HCT: 45.4 % (ref 36.0–46.0)
Hemoglobin: 15.2 g/dL — ABNORMAL HIGH (ref 12.0–15.0)
LYMPHS ABS: 2.5 10*3/uL (ref 0.7–4.0)
Lymphocytes Relative: 35.3 % (ref 12.0–46.0)
MCHC: 33.5 g/dL (ref 30.0–36.0)
MCV: 86.8 fl (ref 78.0–100.0)
MONO ABS: 0.8 10*3/uL (ref 0.1–1.0)
Monocytes Relative: 10.8 % (ref 3.0–12.0)
NEUTROS PCT: 46.6 % (ref 43.0–77.0)
Neutro Abs: 3.3 10*3/uL (ref 1.4–7.7)
Platelets: 324 10*3/uL (ref 150.0–400.0)
RBC: 5.23 Mil/uL — ABNORMAL HIGH (ref 3.87–5.11)
RDW: 14.1 % (ref 11.5–15.5)
WBC: 7 10*3/uL (ref 4.0–10.5)

## 2016-02-20 LAB — HEPATIC FUNCTION PANEL
ALK PHOS: 73 U/L (ref 39–117)
ALT: 17 U/L (ref 0–35)
AST: 17 U/L (ref 0–37)
Albumin: 4.3 g/dL (ref 3.5–5.2)
Bilirubin, Direct: 0.1 mg/dL (ref 0.0–0.3)
Total Bilirubin: 0.6 mg/dL (ref 0.2–1.2)
Total Protein: 7.1 g/dL (ref 6.0–8.3)

## 2016-02-20 LAB — TSH: TSH: 2.48 u[IU]/mL (ref 0.35–4.50)

## 2016-02-22 ENCOUNTER — Other Ambulatory Visit: Payer: Self-pay

## 2016-02-22 DIAGNOSIS — Z1231 Encounter for screening mammogram for malignant neoplasm of breast: Secondary | ICD-10-CM

## 2016-03-04 ENCOUNTER — Other Ambulatory Visit (HOSPITAL_COMMUNITY): Payer: Self-pay | Admitting: Internal Medicine

## 2016-03-05 ENCOUNTER — Ambulatory Visit
Admission: RE | Admit: 2016-03-05 | Discharge: 2016-03-05 | Disposition: A | Discharge status: Home / Self Care | Payer: Medicare Other | Source: Ambulatory Visit

## 2016-03-05 DIAGNOSIS — Z1231 Encounter for screening mammogram for malignant neoplasm of breast: Secondary | ICD-10-CM

## 2016-03-12 ENCOUNTER — Encounter: Payer: Self-pay | Admitting: Cardiology

## 2016-03-12 ENCOUNTER — Ambulatory Visit (INDEPENDENT_AMBULATORY_CARE_PROVIDER_SITE_OTHER): Payer: Medicare Other | Admitting: Cardiology

## 2016-03-12 VITALS — BP 112/68 | HR 76 | Ht 65.0 in | Wt 177.0 lb

## 2016-03-12 DIAGNOSIS — I10 Essential (primary) hypertension: Secondary | ICD-10-CM

## 2016-03-12 DIAGNOSIS — I429 Cardiomyopathy, unspecified: Secondary | ICD-10-CM

## 2016-03-12 DIAGNOSIS — I420 Dilated cardiomyopathy: Secondary | ICD-10-CM | POA: Diagnosis not present

## 2016-03-12 DIAGNOSIS — I428 Other cardiomyopathies: Secondary | ICD-10-CM

## 2016-03-12 DIAGNOSIS — E785 Hyperlipidemia, unspecified: Secondary | ICD-10-CM

## 2016-03-12 MED ORDER — PRAVASTATIN SODIUM 10 MG PO TABS: 10.0000 mg | ORAL_TABLET | Freq: Every day | ORAL | Status: DC

## 2016-03-12 NOTE — Progress Notes (Signed)
Patient ID: Jessica Mcneil, female   DOB: 1941-01-12, 75 y.o.   MRN: [id number]    CARDIOLOGY CLINIC NOTE  Patient ID: Jessica Mcneil, female   DOB: [birth year], 75 y.o.   MRN: [id number]  HPI: Jessica Mcneil is a delightful 75 year old woman with a history of a nonischemic cardiomyopathy, hyperlipidemia. She also has a hx of a R lumpectomy for benign dz 2/14.  1. 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   February 19, 2006 cardiac catheterization revealing normal            coronary arteries with an EF of 50%. EF 35-40% by ECHO.      b.   Echo 2008: EF 40-50% with mild diffuse            LV hypokinesis and abnormal left ventricular relaxation.  Mild AI.      c.   Echo 8/09 EF 55-60%. Trivial AI     d.   CPX 4/07, pVO2: 20.6 (105% predicted) slope of 42.7.  pRER 1.09.            There was a mild restriction on her spirometry.     e. Echo 4/12 EF 60-65% grade 1 DD. Mild MR/AI      f. Echo 7/15 EF 45-50% global HK. Grade 1 DD. Mild RV dysfx. Mild AI/TR     G. Echo 9/16 EF 50-55% Grade 1 DD. Normal RV Mild AI   Follow up, transition from Dr. Haroldine Laws care: 6 months follow up, Doing well. She feels more orthostatic hypotension, no syncope, no chest pain, stable DOE. HCTZ was d/c-ed by Dr Haroldine Laws for hypotension.  No LE swelling. Weight stable at 177. No statin as pain in the past with lipitor and another statin but she doesn't remember the name. Dr. Elease Hashimoto recommended but reluctant to take it due to previous muscle aches. (LDL was 195)  ROS: All systems negative except as listed in HPI, PMH and Problem List.  Past Medical History  Diagnosis Date  . CHF (congestive heart failure) ([place])     due to non ischemic cardiomyopathy  . HLD (hyperlipidemia)   . HTN (hypertension)   . Overweight(278.02)   . GERD (gastroesophageal reflux disease)   . Depression   . PONV (postoperative nausea and vomiting)     long time ago none recent  . Shortness of breath   . Headache(784.0)     migraines  . Arthritis     Current Outpatient Prescriptions  Medication Sig Dispense Refill  . ALPRAZolam (XANAX) 0.5 MG tablet Take 1 tablet (0.5 mg total) by mouth at bedtime as needed. For sleep/anxiety 30 tablet 1  . aspirin EC 81 MG tablet Take 81 mg by mouth daily.    [name] CALCIUM-MAGNESIUM-ZINC PO Take 1 tablet by mouth daily.    . colchicine 0.6 MG tablet Take 0.6 mg by mouth daily as needed (gouty flare).    [name] docusate sodium (COLACE) 100 MG capsule Take 200 mg by mouth at bedtime.    [name] FLUoxetine (PROZAC) 20 MG capsule Take 1 capsule (20 mg total) by mouth daily. 90 capsule 3  . GARLIC PO Take 1 tablet by mouth 2 (two) times daily.    . hydrochlorothiazide (MICROZIDE) 12.5 MG capsule Take 1 capsule (12.5 mg total) by mouth daily as needed (Swelling). 15 capsule 3  . HYDROcodone-acetaminophen (NORCO/VICODIN) 5-325 MG per tablet Take 1 tablet by mouth every 6 (six) hours as needed. For migraines 30 tablet 0  .  latanoprost (XALATAN) 0.005 % ophthalmic solution Place 1 drop into the right eye at bedtime.     . metoprolol succinate (TOPROL-XL) 50 MG 24 hr tablet TAKE 1 TABLET BY MOUTH ONCE DAILY WITH OR IMMEDIATELY FOLLOWING A MEAL 90 tablet 3  . Multiple Vitamin (MULTIVITAMIN WITH MINERALS) TABS Take 1 tablet by mouth daily.    . Omega-3 Fatty Acids 1200 MG CAPS Take 1,200 mg by mouth daily.    [name] omeprazole (PRILOSEC) 20 MG capsule Take 1 capsule (20 mg total) by mouth daily. 90 capsule 3  . spironolactone (ALDACTONE) 25 MG tablet Take 0.5 tablets (12.5 mg total) by mouth daily. 15 tablet 3   No current facility-administered medications for this visit.   Filed Vitals:   03/12/16 0829  BP: 112/68  Pulse: 76  Height: 5\' 5"  (1.651 m)  Weight: 177 lb (80.287 kg)    PHYSICAL EXAM: General:  Well appearing. No resp difficulty HEENT: normal Neck: supple. JVP 5-6 Carotids 2+ bilaterally; no bruits. No lymphadenopathy or thryomegaly appreciated. Cor: PMI normal. Regular rate & rhythm.  No rubs, murmurs.  Lungs: clear Abdomen: obese soft, nontender, nondistended. No hepatosplenomegaly. No bruits or masses. Good bowel sounds. Extremities: no cyanosis, clubbing, rash, edema Neuro: alert & orientedx3, cranial nerves grossly intact. Moves all 4 extremities w/o difficulty. Affect pleasant.   ASSESSMENT & PLAN:  1) Chronic systolic HF: Nonischemic cardiomyopathy. Echo reviewed today personally EF 50-55%. Looks good. No clinical HF. Volume status stable.   - Previously unable to tolerate low-dose ACE-I due to hypotension - Continue Toprol XL 50 mg daily. Continue spironolactone 12.5 mg daily. - Continue HCTZ as needed for lower extremity edema.      2) HTN:  -Blood pressure well controlled. Continue current regimen.  3) HL - Cholesterol very high. Try pravastatin 10 mg po daily, recheck CMP and lipids in 6 weeks.  - Diet and weight loss important.   4) Fatigue - I worry she may have OSA. She will ask her family members if she snores. If so, will refer for sleep study.  Follow up in 6 months.  Ena Dawley H,MD 8:49 AM

## 2016-03-12 NOTE — Patient Instructions (Signed)
Medication Instructions:   START PRAVASTATIN 10 MG ONCE DAILY    Labwork:  IN 6 WEEKS TO CHECK A CMET, AND FASTING LIPIDS---PLEASE COME FASTING TO THIS LAB APPOINTMENT     Follow-Up:  Your physician wants you to follow-up in: [place] will receive a reminder letter in the mail two months in advance. If you don't receive a letter, please call our office to schedule the follow-up appointment.      If you need a refill on your cardiac medications before your next appointment, please call your pharmacy.

## 2016-04-05 DIAGNOSIS — H401423 Capsular glaucoma with pseudoexfoliation of lens, left eye, severe stage: Secondary | ICD-10-CM | POA: Diagnosis not present

## 2016-04-10 DIAGNOSIS — M19041 Primary osteoarthritis, right hand: Secondary | ICD-10-CM | POA: Diagnosis not present

## 2016-04-10 DIAGNOSIS — M7541 Impingement syndrome of right shoulder: Secondary | ICD-10-CM | POA: Diagnosis not present

## 2016-04-14 ENCOUNTER — Other Ambulatory Visit (HOSPITAL_COMMUNITY): Payer: Self-pay | Admitting: Internal Medicine

## 2016-04-23 ENCOUNTER — Other Ambulatory Visit: Payer: Medicare Other

## 2016-04-24 ENCOUNTER — Other Ambulatory Visit (INDEPENDENT_AMBULATORY_CARE_PROVIDER_SITE_OTHER): Payer: Medicare Other | Admitting: *Deleted

## 2016-04-24 DIAGNOSIS — I420 Dilated cardiomyopathy: Secondary | ICD-10-CM | POA: Diagnosis not present

## 2016-04-24 DIAGNOSIS — E785 Hyperlipidemia, unspecified: Secondary | ICD-10-CM | POA: Diagnosis not present

## 2016-04-24 LAB — LIPID PANEL
Cholesterol: 214 mg/dL — ABNORMAL HIGH (ref 125–200)
HDL: 59 mg/dL (ref >=46)
LDL Cholesterol: 128 mg/dL (ref <130)
Total CHOL/HDL Ratio: 3.6 Ratio (ref <=5.0)
Triglycerides: 134 mg/dL (ref <150)
VLDL: 27 mg/dL (ref <30)

## 2016-04-24 LAB — COMPREHENSIVE METABOLIC PANEL
ALT: 14 U/L (ref 6–29)
AST: 14 U/L (ref 10–35)
Albumin: 3.8 g/dL (ref 3.6–5.1)
Alkaline Phosphatase: 75 U/L (ref 33–130)
BUN: 13 mg/dL (ref 7–25)
CO2: 28 mmol/L (ref 20–31)
Calcium: 8.8 mg/dL (ref 8.6–10.4)
Chloride: 102 mmol/L (ref 98–110)
Creat: 0.83 mg/dL (ref 0.60–0.93)
Glucose, Bld: 106 mg/dL — ABNORMAL HIGH (ref 65–99)
Potassium: 4.4 mmol/L (ref 3.5–5.3)
Sodium: 140 mmol/L (ref 135–146)
Total Bilirubin: 0.4 mg/dL (ref 0.2–1.2)
Total Protein: 6.6 g/dL (ref 6.1–8.1)

## 2016-04-24 NOTE — Addendum Note (Signed)
Addended by: Eulis Foster on: 04/24/2016 07:52 AM   Modules accepted: Orders

## 2016-04-24 NOTE — Addendum Note (Signed)
Addended by: Eulis Foster on: 04/24/2016 07:51 AM   Modules accepted: Orders

## 2016-04-26 ENCOUNTER — Telehealth: Payer: Self-pay | Admitting: *Deleted

## 2016-04-26 MED ORDER — PRAVASTATIN SODIUM 20 MG PO TABS: 20.0000 mg | ORAL_TABLET | Freq: Every evening | ORAL | Status: DC

## 2016-04-26 NOTE — Telephone Encounter (Signed)
Notified the pt that per Dr Meda Coffee, her electrolytes, kidney, and liver function were all normal.  Notified the pt that per Dr Meda Coffee, she had an excellent response to pravastatin, and her LDL went down from 182 to 128, but still slightly higher than desired, so she recommends that is she tolerates pravastatin, then we should increase to 20 mg po daily, however if she prefers to stay on 10 mg that is acceptable too.  Per the pt, she agrees to take pravastatin 20 mg po daily, but she only wants a month supply to be sent into her pharmacy, to make sure she tolerates this appropriately.  Confirmed the pharmacy of choice with the pt.  Advised her to contact our office if she can't tolerate this, and for further refills. Pt verbalized understanding and agrees with this plan.

## 2016-04-26 NOTE — Telephone Encounter (Signed)
-----   Message from Dorothy Spark, MD sent at 04/25/2016 12:44 PM EDT ----- She had normal electrolytes liver and kidney function. She had excellent response to pravastatin and her LDL went down from 182 to 128, it still slightly higher than desired and if she tolerates pravastatin I would increase to 20 mg daily, however if she prefers to stay on 10 to be acceptable too.

## 2016-05-15 DIAGNOSIS — H401423 Capsular glaucoma with pseudoexfoliation of lens, left eye, severe stage: Secondary | ICD-10-CM | POA: Diagnosis not present

## 2016-05-15 DIAGNOSIS — H401111 Primary open-angle glaucoma, right eye, mild stage: Secondary | ICD-10-CM | POA: Diagnosis not present

## 2016-06-12 ENCOUNTER — Other Ambulatory Visit (HOSPITAL_COMMUNITY): Payer: Self-pay | Admitting: Internal Medicine

## 2016-07-15 ENCOUNTER — Other Ambulatory Visit: Payer: Self-pay | Admitting: *Deleted

## 2016-07-15 NOTE — Telephone Encounter (Signed)
According to the chart Dr Elease Hashimoto did not prescribe this for the pt.

## 2016-07-15 NOTE — Telephone Encounter (Signed)
Refill OK

## 2016-07-16 MED ORDER — COLCHICINE 0.6 MG PO TABS: 0.6000 mg | ORAL_TABLET | Freq: Every day | ORAL | 1 refills | Status: DC | PRN

## 2016-07-19 ENCOUNTER — Telehealth: Payer: Self-pay | Admitting: Family Medicine

## 2016-07-19 ENCOUNTER — Telehealth: Payer: Self-pay | Admitting: Cardiology

## 2016-07-19 NOTE — Telephone Encounter (Signed)
Last seen on 02/13/2016

## 2016-07-19 NOTE — Telephone Encounter (Signed)
Pt states she has a flair up of gout, (one about every 3 weeks), and this one has lasted a week. Pt would like to know if Dr Elease Hashimoto would go ahead and prescribe Allopurinol. The colchicine 0.6 MG tablet was too expensive. But pt would like to go on a maintenance med for her reoccurring gout. [facility] [address]

## 2016-07-19 NOTE — Telephone Encounter (Signed)
New Message  Pt c/o medication issue:  1. Name of Medication: Pt verbalized not sure of which medication  2. How are you currently taking this medication (dosage and times per day)? Pt verbalized not sure of which medication  3. Are you having a reaction (difficulty breathing--STAT)? Pt verbalized not sure of which medication  4. What is your medication issue? Pt verbalized if she has any flare-ups to contact nurse about medication.

## 2016-07-19 NOTE — Telephone Encounter (Signed)
Pt is calling to ask if Dr Meda Coffee would prescribe her Allopurinol, as discussed at last OV, for flare up of gout.  Pt states that she discussed this with Dr Meda Coffee at her March appt, but told Dr Meda Coffee that she preferred to hold off on this, until her gout isn't manageable.  Informed the pt that Dr Meda Coffee is out of the office this week and the next.  Advised the pt that she should call her PCP for this request, or we can wait for Dr Meda Coffee to advise on return to the office.  Advised the pt that if her flare-up is not manageable at this time, she should definitely notify her PCP today.  Pt verbalized understanding and agrees with this plan.  Pt states she will call Dr Elease Hashimoto now, and will let us know if he was able to help out or not.  Informed the pt that I will still send this message to Dr Meda Coffee for further review and recommendation if needed.  Pt gracious for all the assistance provided.

## 2016-07-21 NOTE — Telephone Encounter (Signed)
We do not want to start Allopurinol during an acute gout flare as this may worsen her gout. Would be best to set up follow up.  Multiple things to discuss about initiating Allopurinol appropriately.

## 2016-07-22 NOTE — Telephone Encounter (Signed)
Pt is aware of annotations. She is agreeable to call back and schedule a follow up appointment.

## 2016-07-29 NOTE — Telephone Encounter (Signed)
Notified the pt that per Dr Meda Coffee, she recommends that she can take Allopurinol 100 mg po daily x 1 week, then increase to 200 mg po daily x 1 week, then increase to 300 mg po daily thereafter, long term.  Per the pt, she wants to think about this for a bit and will call us back if she decides to proceed with this recommendation.  Pt states her "flare-up" has improved, and she really doesn't want to take anything long-term, unless she absolutely cannot tolerate her gout.  Informed the pt that would be fine and I will let Dr Meda Coffee know about her decision. Informed the pt that the order is there and to notify us if her mind changes.  Pt verbalized understanding and agrees with this plan.  Pt gracious for all the assistance provided.

## 2016-07-29 NOTE — Telephone Encounter (Signed)
Please prescribe her Allopurinol 100 mg po daily x 1 week, then increase to 200 mg po daily x 1 week, then increase to 300 mg po daily long term. Thank you, KN

## 2016-07-31 DIAGNOSIS — M545 Low back pain: Secondary | ICD-10-CM | POA: Diagnosis not present

## 2016-07-31 DIAGNOSIS — M7541 Impingement syndrome of right shoulder: Secondary | ICD-10-CM | POA: Diagnosis not present

## 2016-07-31 DIAGNOSIS — M19041 Primary osteoarthritis, right hand: Secondary | ICD-10-CM | POA: Diagnosis not present

## 2016-08-10 ENCOUNTER — Other Ambulatory Visit (HOSPITAL_COMMUNITY): Payer: Self-pay | Admitting: Internal Medicine

## 2016-08-12 ENCOUNTER — Other Ambulatory Visit: Payer: Self-pay | Admitting: Family Medicine

## 2016-08-14 NOTE — Telephone Encounter (Signed)
Rx refill sent to pharmacy. 

## 2016-08-22 ENCOUNTER — Telehealth: Payer: Self-pay | Admitting: Family Medicine

## 2016-08-22 MED ORDER — COLCHICINE 0.6 MG PO TABS: 0.6000 mg | ORAL_TABLET | Freq: Every day | ORAL | 1 refills | Status: DC | PRN

## 2016-08-22 NOTE — Telephone Encounter (Signed)
Medication refilled for patient. 

## 2016-08-22 NOTE — Telephone Encounter (Signed)
° °  Pt request refill of the following:  colchicine 0.6 MG tablet   Phamacy:   [facility] [address]

## 2016-08-30 DIAGNOSIS — M545 Low back pain: Secondary | ICD-10-CM | POA: Diagnosis not present

## 2016-08-30 DIAGNOSIS — M7541 Impingement syndrome of right shoulder: Secondary | ICD-10-CM | POA: Diagnosis not present

## 2016-09-11 ENCOUNTER — Other Ambulatory Visit (HOSPITAL_COMMUNITY): Payer: Self-pay | Admitting: Internal Medicine

## 2016-09-16 ENCOUNTER — Ambulatory Visit (INDEPENDENT_AMBULATORY_CARE_PROVIDER_SITE_OTHER): Payer: Medicare Other | Admitting: Family Medicine

## 2016-09-16 VITALS — BP 108/70 | HR 81 | Temp 97.8°F | Ht 65.0 in | Wt 175.6 lb

## 2016-09-16 DIAGNOSIS — E785 Hyperlipidemia, unspecified: Secondary | ICD-10-CM

## 2016-09-16 DIAGNOSIS — M1000 Idiopathic gout, unspecified site: Secondary | ICD-10-CM

## 2016-09-16 DIAGNOSIS — Z8659 Personal history of other mental and behavioral disorders: Secondary | ICD-10-CM

## 2016-09-16 MED ORDER — ALLOPURINOL 100 MG PO TABS: ORAL_TABLET | ORAL | 6 refills | Status: DC

## 2016-09-16 MED ORDER — FLUOXETINE HCL 20 MG PO TABS: ORAL_TABLET | ORAL | 3 refills | Status: DC

## 2016-09-16 NOTE — Progress Notes (Signed)
Pre visit review using our clinic review tool, if applicable. No additional management support is needed unless otherwise documented below in the visit note. 

## 2016-09-16 NOTE — Progress Notes (Signed)
Subjective:     Patient ID: Jessica Mcneil, female   DOB: 20-Jul-1941, 75 y.o.   MRN: [id number]  HPI   Patient seen for follow-up regarding multiple items. She has past history of migraine headaches, systolic heart failure, hypertension, cardiomyopathy, hyperlipidemia, history of recurrent depression, and gout.  She's had more frequent flareups of gout. She states that she's had about one episode per month. Takes colchicine acutely. We previously discussed prophylaxis. Last uric acid 8.2%. She is willing to consider prophylaxis at this point. She does not use alcohol. She is not aware of any specific dietary triggers and gout usually involves her feet. She has during acute episodes warmth and tenderness and mild erythema.  History of recurrent depression. She currently takes Prozac 20 g daily. In the past has taken 40 mg daily and is requesting titration now. She has hyperlipidemia and we had placed her on Crestor several months ago but she had myalgias. She had been previously intolerant of other statins including Lipitor and simvastatin. Cardiologist recently increased her pravastatin to 20 mg and she had flareup of low back pain and myalgias and stopped taking this altogether. She apparently was tolerating the 10 mg dosage.  Past Medical History:  Diagnosis Date  . Arthritis   . CHF (congestive heart failure) ([place])    due to non ischemic cardiomyopathy  . Depression   . GERD (gastroesophageal reflux disease)   . Headache(784.0)    migraines  . HLD (hyperlipidemia)   . HTN (hypertension)   . Overweight(278.02)   . PONV (postoperative nausea and vomiting)    long time ago none recent  . Shortness of breath    Past Surgical History:  Procedure Laterality Date  . ANTERIOR AND POSTERIOR REPAIR  03  . APPENDECTOMY    . BELPHAROPTOSIS REPAIR     bilat  . BREAST LUMPECTOMY WITH NEEDLE LOCALIZATION Right 02/12/2013   Procedure: BREAST LUMPECTOMY WITH NEEDLE LOCALIZATION;  Surgeon: Merrie Roof, MD;  Location: [place];  Service: General;  Laterality: Right;  . CHOLECYSTECTOMY    . COLONOSCOPY    . EYE SURGERY     Glaucoma Implant  . TONSILLECTOMY    . VAGINAL HYSTERECTOMY  1998    reports that she quit smoking about 33 years ago. Her smoking use included Cigarettes. She smoked 2.00 packs per day. She does not have any smokeless tobacco history on file. She reports that she does not drink alcohol or use drugs. family history includes Heart disease in her father. Allergies  Allergen Reactions  . Codeine     REACTION: itiching  . Erythromycin     REACTION: GI upset  . Lipitor [Atorvastatin] Other (See Comments)  . Sulfamethoxazole Itching    Over 40 years ago      Review of Systems  Constitutional: Negative for fatigue.  Eyes: Negative for visual disturbance.  Respiratory: Negative for cough, chest tightness, shortness of breath and wheezing.   Cardiovascular: Negative for chest pain, palpitations and leg swelling.  Endocrine: Negative for polydipsia and polyuria.  Musculoskeletal: Positive for arthralgias.  Neurological: Negative for dizziness, seizures, syncope, weakness, light-headedness and headaches.       Objective:   Physical Exam  Constitutional: She appears well-developed and well-nourished.  Eyes: Pupils are equal, round, and reactive to light.  Neck: Neck supple. No JVD present. No thyromegaly present.  Cardiovascular: Normal rate and regular rhythm.  Exam reveals no gallop.   Pulmonary/Chest: Effort normal and breath sounds normal.  No respiratory distress. She has no wheezes. She has no rales.  Musculoskeletal: She exhibits no edema.  Neurological: She is alert.       Assessment:     #1 gout with frequent flareups which she estimates at approximately monthly at this point with previously elevated uric acid levels.  #2 dyslipidemia. Intolerant of multiple statins and higher dose pravastatin  #3 history of recurrent  depression    Plan:     -Start allopurinol 100 mg daily for 1 week then 200 mg daily for 1 week then 300 mg daily -Take colchine 0.6 mg 1 daily during prophylaxis build up -Follow-up in one month and repeat uric acid level then with goal less than 6 -Increase Prozac to 40 mg daily -Go back on pravastatin 10 mg daily. We explained that taking even low-dose statin would be better than taking none at all. Consider follow-up lipids with follow-up visit  Eulas Post MD [facility]

## 2016-09-16 NOTE — Patient Instructions (Signed)

## 2016-09-17 ENCOUNTER — Other Ambulatory Visit: Payer: Self-pay

## 2016-09-17 MED ORDER — FLUOXETINE HCL 20 MG PO CAPS: ORAL_CAPSULE | ORAL | 3 refills | Status: DC

## 2016-09-23 ENCOUNTER — Other Ambulatory Visit (HOSPITAL_COMMUNITY): Payer: Self-pay | Admitting: *Deleted

## 2016-09-23 ENCOUNTER — Other Ambulatory Visit (HOSPITAL_COMMUNITY): Payer: Self-pay | Admitting: Internal Medicine

## 2016-09-23 MED ORDER — SPIRONOLACTONE 25 MG PO TABS: ORAL_TABLET | ORAL | 3 refills | Status: DC

## 2016-10-11 ENCOUNTER — Ambulatory Visit (INDEPENDENT_AMBULATORY_CARE_PROVIDER_SITE_OTHER): Payer: Medicare Other | Admitting: Family Medicine

## 2016-10-11 ENCOUNTER — Encounter: Payer: Self-pay | Admitting: Family Medicine

## 2016-10-11 VITALS — BP 118/72 | HR 88 | Temp 98.2°F | Ht 65.0 in | Wt 177.2 lb

## 2016-10-11 DIAGNOSIS — Z8659 Personal history of other mental and behavioral disorders: Secondary | ICD-10-CM | POA: Diagnosis not present

## 2016-10-11 DIAGNOSIS — J209 Acute bronchitis, unspecified: Secondary | ICD-10-CM

## 2016-10-11 DIAGNOSIS — E785 Hyperlipidemia, unspecified: Secondary | ICD-10-CM

## 2016-10-11 DIAGNOSIS — M1000 Idiopathic gout, unspecified site: Secondary | ICD-10-CM

## 2016-10-11 LAB — URIC ACID: URIC ACID, SERUM: 3.9 mg/dL (ref 2.4–7.0)

## 2016-10-11 LAB — LIPID PANEL
CHOL/HDL RATIO: 4
Cholesterol: 215 mg/dL — ABNORMAL HIGH (ref 0–200)
HDL: 54.5 mg/dL (ref >39.00)
LDL Cholesterol: 137 mg/dL — ABNORMAL HIGH (ref 0–99)
NONHDL: 160.2
Triglycerides: 115 mg/dL (ref 0.0–149.0)
VLDL: 23 mg/dL (ref 0.0–40.0)

## 2016-10-11 MED ORDER — DOXYCYCLINE HYCLATE 100 MG PO CAPS: 100.0000 mg | ORAL_CAPSULE | Freq: Two times a day (BID) | ORAL | 0 refills | Status: DC

## 2016-10-11 MED ORDER — ALLOPURINOL 300 MG PO TABS: 300.0000 mg | ORAL_TABLET | Freq: Every day | ORAL | 3 refills | Status: DC

## 2016-10-11 NOTE — Progress Notes (Signed)
Subjective:     Patient ID: Jessica Mcneil, female   DOB: August 01, 1941, 75 y.o.   MRN: [id number]  HPI Patient seen for follow-up regarding multiple issues  History of gout. She is having frequent flareups. We started allopurinol and she is titrated up to 300 mg has not had any breakthrough. Check Colchicine for prophylaxis. Her plan is to get follow-up uric acid today  History of depression and we recently titrated up her Prozac to 40 mg daily. She feels better overall. Less anxious. Much improved mood.  Hyperlipidemia. Intolerant of higher dose pravastatin. She has been able take 10 mg. She had myalgias with 20 mg dose.  New problem of cough productive of yellow to green mucus past 2 weeks. No fever. Mild dyspnea with exertion. No chest pain. No wheezing.  Past Medical History:  Diagnosis Date  . Arthritis   . CHF (congestive heart failure) ([place])    due to non ischemic cardiomyopathy  . Depression   . GERD (gastroesophageal reflux disease)   . Headache(784.0)    migraines  . HLD (hyperlipidemia)   . HTN (hypertension)   . Overweight(278.02)   . PONV (postoperative nausea and vomiting)    long time ago none recent  . Shortness of breath    Past Surgical History:  Procedure Laterality Date  . ANTERIOR AND POSTERIOR REPAIR  03  . APPENDECTOMY    . BELPHAROPTOSIS REPAIR     bilat  . BREAST LUMPECTOMY WITH NEEDLE LOCALIZATION Right 02/12/2013   Procedure: BREAST LUMPECTOMY WITH NEEDLE LOCALIZATION;  Surgeon: Merrie Roof, MD;  Location: [facility];  Service: General;  Laterality: Right;  . CHOLECYSTECTOMY    . COLONOSCOPY    . EYE SURGERY     Glaucoma Implant  . TONSILLECTOMY    . VAGINAL HYSTERECTOMY  1998    reports that she quit smoking about 33 years ago. Her smoking use included Cigarettes. She smoked 2.00 packs per day. She does not have any smokeless tobacco history on file. She reports that she does not drink alcohol or use drugs. family history  includes Heart disease in her father. Allergies  Allergen Reactions  . Codeine     REACTION: itiching  . Erythromycin     REACTION: GI upset  . Lipitor [Atorvastatin] Other (See Comments)  . Sulfamethoxazole Itching    Over 40 years ago     Review of Systems  Constitutional: Negative for chills, fatigue and fever.  Eyes: Negative for visual disturbance.  Respiratory: Positive for cough and shortness of breath. Negative for chest tightness and wheezing.   Cardiovascular: Negative for chest pain, palpitations and leg swelling.  Musculoskeletal: Negative for arthralgias.  Neurological: Negative for dizziness, seizures, syncope, weakness, light-headedness and headaches.       Objective:   Physical Exam  Constitutional: She appears well-developed and well-nourished.  Eyes: Pupils are equal, round, and reactive to light.  Neck: Neck supple. No JVD present. No thyromegaly present.  Cardiovascular: Normal rate and regular rhythm.  Exam reveals no gallop.   Pulmonary/Chest: Effort normal and breath sounds normal. No respiratory distress. She has no wheezes. She has no rales.  Musculoskeletal: She exhibits no edema.  Neurological: She is alert.       Assessment:     #1 gout with recent initiation of allopurinol  #2 history of recurrent depression improved on increased dose of Prozac  #3 acute bronchitis  #4 dyslipidemia    Plan:     -recheck labs  today with uric acid and lipid panel -Goal uric acid level less than 6 -Rewrote prescription for allopurinol 300 mg 1 daily -Start doxycycline 100 mg twice daily for 7 days and follow-up for any fever or worsening symptoms -Routine follow-up 6 months and sooner as needed  Eulas Post MD [facility] at [facility]

## 2016-10-16 ENCOUNTER — Ambulatory Visit: Payer: Medicare Other | Admitting: Family Medicine

## 2016-10-31 ENCOUNTER — Ambulatory Visit (INDEPENDENT_AMBULATORY_CARE_PROVIDER_SITE_OTHER): Payer: Medicare Other | Admitting: Cardiology

## 2016-10-31 ENCOUNTER — Encounter: Payer: Self-pay | Admitting: Cardiology

## 2016-10-31 VITALS — BP 124/72 | HR 82 | Ht 65.0 in | Wt 177.0 lb

## 2016-10-31 DIAGNOSIS — I10 Essential (primary) hypertension: Secondary | ICD-10-CM | POA: Diagnosis not present

## 2016-10-31 DIAGNOSIS — E785 Hyperlipidemia, unspecified: Secondary | ICD-10-CM

## 2016-10-31 DIAGNOSIS — I5043 Acute on chronic combined systolic (congestive) and diastolic (congestive) heart failure: Secondary | ICD-10-CM | POA: Diagnosis not present

## 2016-10-31 DIAGNOSIS — I5022 Chronic systolic (congestive) heart failure: Secondary | ICD-10-CM

## 2016-10-31 DIAGNOSIS — I420 Dilated cardiomyopathy: Secondary | ICD-10-CM

## 2016-10-31 DIAGNOSIS — I428 Other cardiomyopathies: Secondary | ICD-10-CM

## 2016-10-31 DIAGNOSIS — Z789 Other specified health status: Secondary | ICD-10-CM

## 2016-10-31 MED ORDER — SPIRONOLACTONE 25 MG PO TABS: ORAL_TABLET | ORAL | 3 refills | Status: DC

## 2016-10-31 NOTE — Patient Instructions (Signed)
Medication Instructions:   Your physician recommends that you continue on your current medications as directed. Please refer to the Current Medication list given to you today.     Testing/Procedures:   Your physician has requested that you have an echocardiogram. Echocardiography is a painless test that uses sound waves to create images of your heart. It provides your doctor with information about the size and shape of your heart and how well your heart's chambers and valves are working. This procedure takes approximately one hour. There are no restrictions for this procedure.    Follow-Up:  3 MONTHS WITH DR NELSON       If you need a refill on your cardiac medications before your next appointment, please call your pharmacy.   

## 2016-10-31 NOTE — Progress Notes (Signed)
Patient ID: Jessica Mcneil, female   DOB: May 23, 1941, 75 y.o.   MRN: [id number]    CARDIOLOGY CLINIC NOTE  Patient ID: Jessica Mcneil, female   DOB: [birth year], 75 y.o.   MRN: [id number]  HPI: Jessica Mcneil is a delightful 75 year old woman with a history of a nonischemic cardiomyopathy, hyperlipidemia. She also has a hx of a R lumpectomy for benign dz 2/14.  1. 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   February 19, 2006 cardiac catheterization revealing normal            coronary arteries with an EF of 50%. EF 35-40% by ECHO.      b.   Echo 2008: EF 40-50% with mild diffuse            LV hypokinesis and abnormal left ventricular relaxation.  Mild AI.      c.   Echo 8/09 EF 55-60%. Trivial AI     d.   CPX 4/07, pVO2: 20.6 (105% predicted) slope of 42.7.  pRER 1.09.            There was a mild restriction on her spirometry.     e. Echo 4/12 EF 60-65% grade 1 DD. Mild MR/AI      f. Echo 7/15 EF 45-50% global HK. Grade 1 DD. Mild RV dysfx. Mild AI/TR     G. Echo 9/16 EF 50-55% (read as 45-50%) Grade 1 DD. Normal RV Mild AI  She transitioned her care from Dr Haroldine Laws, she was last seen in March 2017, pravastatin was added to her regimen as her LDL was 180 with improvement to 120, we tried to increase to 20 mg a day however she couldn't tolerated because of significant muscle pain and decreased back to 10 mg daily. She has been dealing with gout and increase her allopurinol from 100-202 300 mg by mouth twice a day in the last months she had 2 episodes of gout. The patient has been treated for upper respiratory infections with doxycycline and completed about 2 weeks ago however she continues to feel short of breath on exertion. No chest pain. No orthopnea or personal nocturnal dyspnea. No lower extremity edema. She denies any palpitations or syncope.  ROS: All systems negative except as listed in HPI, PMH and Problem List.  Past Medical History:  Diagnosis Date  . Arthritis   . CHF  (congestive heart failure) ([place])    due to non ischemic cardiomyopathy  . Depression   . GERD (gastroesophageal reflux disease)   . Headache(784.0)    migraines  . HLD (hyperlipidemia)   . HTN (hypertension)   . Overweight(278.02)   . PONV (postoperative nausea and vomiting)    long time ago none recent  . Shortness of breath     Current Outpatient Prescriptions  Medication Sig Dispense Refill  . allopurinol (ZYLOPRIM) 300 MG tablet Take 1 tablet (300 mg total) by mouth daily. 90 tablet 3  . ALPRAZolam (XANAX) 0.5 MG tablet Take 1 tablet (0.5 mg total) by mouth at bedtime as needed. For sleep/anxiety 30 tablet 1  . aspirin EC 81 MG tablet Take 81 mg by mouth daily.    [name] CALCIUM-MAGNESIUM-ZINC PO Take 1 tablet by mouth daily.    . colchicine 0.6 MG tablet Take 1 tablet (0.6 mg total) by mouth daily as needed (gouty flare). 30 tablet 1  . docusate sodium (COLACE) 100 MG capsule Take 200 mg by mouth at bedtime.    [name] doxycycline (  VIBRAMYCIN) 100 MG capsule Take 1 capsule (100 mg total) by mouth 2 (two) times daily. 14 capsule 0  . FLUoxetine (PROZAC) 20 MG capsule Take 2 capsules by mouth once daily. 180 capsule 3  . GARLIC PO Take 1 tablet by mouth 2 (two) times daily.    . hydrochlorothiazide (MICROZIDE) 12.5 MG capsule Take 1 capsule (12.5 mg total) by mouth daily as needed (Swelling). 15 capsule 3  . HYDROcodone-acetaminophen (NORCO/VICODIN) 5-325 MG per tablet Take 1 tablet by mouth every 6 (six) hours as needed. For migraines 30 tablet 0  . metoprolol succinate (TOPROL-XL) 50 MG 24 hr tablet TAKE 1 TABLET BY MOUTH ONCE DAILY WITH OR IMMEDIATELY FOLLOWING A MEAL 90 tablet 3  . Multiple Vitamin (MULTIVITAMIN WITH MINERALS) TABS Take 1 tablet by mouth daily.    . Omega-3 Fatty Acids 1200 MG CAPS Take 1,200 mg by mouth daily.    [name] omeprazole (PRILOSEC) 20 MG capsule TAKE 1 CAPSULE BY MOUTH ONCE DAILY 30 capsule 2  . pravastatin (PRAVACHOL) 20 MG tablet Take 10 mg by mouth every evening.   1  . spironolactone (ALDACTONE) 25 MG tablet TAKE 1/2 TABLET ONCE A DAY 15 tablet 3   No current facility-administered medications for this visit.    Vitals:   10/31/16 1121  BP: 124/72  Pulse: 82  Weight: 177 lb (80.3 kg)  Height: 5\' 5"  (1.651 m)    PHYSICAL EXAM: General:  Well appearing. No resp difficulty HEENT: normal Neck: supple. JVP 5-6 Carotids 2+ bilaterally; no bruits. No lymphadenopathy or thryomegaly appreciated. Cor: PMI normal. Regular rate & rhythm. No rubs, murmurs.  Lungs: clear Abdomen: obese soft, nontender, nondistended. No hepatosplenomegaly. No bruits or masses. Good bowel sounds. Extremities: no cyanosis, clubbing, rash, edema Neuro: alert & orientedx3, cranial nerves grossly intact. Moves all 4 extremities w/o difficulty. Affect pleasant.  EKG on 10/31/2016 shows normal sinus rhythm nonspecific T wave abnormalities that are unchanged from 03/12/2016    ASSESSMENT & PLAN:  1) Chronic systolic HF: Nonischemic cardiomyopathy. With prior history of low EF the most recent echo performed a year ago LVEF 50-55% now worsening dyspnea on exertion with no evidence of fluid overload we will recheck echocardiogram to reevaluate LVEF. No clinical HF. - Previously unable to tolerate low-dose ACE-I due to hypotension - Continue Toprol XL 50 mg daily. Continue spironolactone 12.5 mg daily. - Discontinue HCTZ as needed for lower extremity edema, she hasn't used it in a very long time however shouldn't use it with history of frequent gout.      2) Essential hypertension -Blood pressure well controlled. Continue current regimen.  3) gout - Patient is told to continue same dose of out Eupora no for another months if she has recurrent episodes she will have to follow with PCP or rheumatology for further suggestions for treatment.  4) HL - Cholesterol improved with pravastatin 10 mg po daily, decreased from 180-120, she was not able to tolerate higher dose of pravastatin.    - Diet and weight loss important.   Follow up in 3 months.  Rilen Shukla,MD 11:39 AM

## 2016-11-07 ENCOUNTER — Other Ambulatory Visit: Payer: Self-pay | Admitting: Cardiology

## 2016-11-18 ENCOUNTER — Other Ambulatory Visit (HOSPITAL_COMMUNITY): Payer: Self-pay | Admitting: Internal Medicine

## 2016-11-25 ENCOUNTER — Other Ambulatory Visit: Payer: Self-pay

## 2016-11-25 ENCOUNTER — Ambulatory Visit (HOSPITAL_COMMUNITY): Payer: Medicare Other | Attending: Cardiology

## 2016-11-25 DIAGNOSIS — I5043 Acute on chronic combined systolic (congestive) and diastolic (congestive) heart failure: Secondary | ICD-10-CM | POA: Diagnosis not present

## 2016-11-28 ENCOUNTER — Telehealth: Payer: Self-pay | Admitting: Emergency Medicine

## 2016-11-28 ENCOUNTER — Other Ambulatory Visit: Payer: Self-pay | Admitting: Emergency Medicine

## 2016-11-28 MED ORDER — OMEPRAZOLE 20 MG PO CPDR: 20.0000 mg | DELAYED_RELEASE_CAPSULE | Freq: Every day | ORAL | 1 refills | Status: DC

## 2016-11-28 MED ORDER — ALPRAZOLAM 0.5 MG PO TABS: 0.5000 mg | ORAL_TABLET | Freq: Every evening | ORAL | 0 refills | Status: DC | PRN

## 2016-11-28 MED ORDER — PRAVASTATIN SODIUM 20 MG PO TABS: 20.0000 mg | ORAL_TABLET | Freq: Every evening | ORAL | 2 refills | Status: DC

## 2016-11-28 MED ORDER — ALLOPURINOL 300 MG PO TABS
300.0000 mg | ORAL_TABLET | Freq: Every day | ORAL | 2 refills | Status: DC
Start: 2016-11-28 — End: 2017-07-07

## 2016-11-28 NOTE — Telephone Encounter (Signed)
Pt requesting refill ALPRAZolam ER tab  Last filled 02/13/16 #30 Refills 1 Last OV 10/11/16   Pt is requesting a 90 day refill  Okay to refill?

## 2016-11-28 NOTE — Telephone Encounter (Signed)
Refill called in to pharmacy

## 2016-11-28 NOTE — Telephone Encounter (Signed)
Refill once. Recommend against regular use. 

## 2016-12-03 ENCOUNTER — Other Ambulatory Visit: Payer: Self-pay | Admitting: Cardiology

## 2016-12-03 DIAGNOSIS — I5043 Acute on chronic combined systolic (congestive) and diastolic (congestive) heart failure: Secondary | ICD-10-CM

## 2016-12-03 DIAGNOSIS — I10 Essential (primary) hypertension: Secondary | ICD-10-CM

## 2016-12-03 DIAGNOSIS — E785 Hyperlipidemia, unspecified: Secondary | ICD-10-CM

## 2016-12-03 MED ORDER — METOPROLOL SUCCINATE ER 50 MG PO TB24: ORAL_TABLET | ORAL | 3 refills | Status: DC

## 2016-12-03 MED ORDER — SPIRONOLACTONE 25 MG PO TABS: ORAL_TABLET | ORAL | 3 refills | Status: DC

## 2016-12-11 ENCOUNTER — Telehealth: Payer: Self-pay | Admitting: Family Medicine

## 2016-12-11 MED ORDER — FLUOXETINE HCL 20 MG PO CAPS: ORAL_CAPSULE | ORAL | 2 refills | Status: DC

## 2016-12-11 NOTE — Telephone Encounter (Signed)
Received a fax from [facility] requesting medication.  Was filled on 09/17/16 X 1 year and sent to [facility] [address].  I sent in for 9 months to [facility].

## 2016-12-18 DIAGNOSIS — M7541 Impingement syndrome of right shoulder: Secondary | ICD-10-CM | POA: Diagnosis not present

## 2017-01-03 DIAGNOSIS — H401433 Capsular glaucoma with pseudoexfoliation of lens, bilateral, severe stage: Secondary | ICD-10-CM | POA: Diagnosis not present

## 2017-01-03 DIAGNOSIS — Z0100 Encounter for examination of eyes and vision without abnormal findings: Secondary | ICD-10-CM | POA: Diagnosis not present

## 2017-01-03 DIAGNOSIS — H2513 Age-related nuclear cataract, bilateral: Secondary | ICD-10-CM | POA: Diagnosis not present

## 2017-01-14 ENCOUNTER — Other Ambulatory Visit: Payer: Self-pay | Admitting: Family Medicine

## 2017-01-14 DIAGNOSIS — Z1231 Encounter for screening mammogram for malignant neoplasm of breast: Secondary | ICD-10-CM

## 2017-01-27 ENCOUNTER — Encounter: Payer: Self-pay | Admitting: *Deleted

## 2017-01-31 ENCOUNTER — Encounter: Payer: Self-pay | Admitting: Cardiology

## 2017-01-31 ENCOUNTER — Ambulatory Visit (INDEPENDENT_AMBULATORY_CARE_PROVIDER_SITE_OTHER): Payer: Medicare Other | Admitting: Cardiology

## 2017-01-31 VITALS — BP 114/62 | HR 77 | Ht 65.0 in | Wt 176.0 lb

## 2017-01-31 DIAGNOSIS — E785 Hyperlipidemia, unspecified: Secondary | ICD-10-CM

## 2017-01-31 DIAGNOSIS — I428 Other cardiomyopathies: Secondary | ICD-10-CM | POA: Diagnosis not present

## 2017-01-31 DIAGNOSIS — I10 Essential (primary) hypertension: Secondary | ICD-10-CM

## 2017-01-31 DIAGNOSIS — I5022 Chronic systolic (congestive) heart failure: Secondary | ICD-10-CM | POA: Diagnosis not present

## 2017-01-31 DIAGNOSIS — Z789 Other specified health status: Secondary | ICD-10-CM | POA: Diagnosis not present

## 2017-01-31 NOTE — Patient Instructions (Signed)
Medication Instructions:   Your physician recommends that you continue on your current medications as directed. Please refer to the Current Medication list given to you today.    Follow-Up:  4 MONTHS WITH DR NELSON       If you need a refill on your cardiac medications before your next appointment, please call your pharmacy.   

## 2017-01-31 NOTE — Progress Notes (Signed)
Patient ID: Jessica Mcneil, female   DOB: Nov 13, 1941, 76 y.o.   MRN: [id number]    CARDIOLOGY CLINIC NOTE  Patient ID: Jessica Mcneil, female   DOB: [birth year], 76 y.o.   MRN: [id number]  HPI: Jessica Mcneil is a delightful 76 year old woman with a history of a nonischemic cardiomyopathy, hyperlipidemia. She also has a hx of a R lumpectomy for benign dz 2/14.  1. 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   February 19, 2006 cardiac catheterization revealing normal            coronary arteries with an EF of 50%. EF 35-40% by ECHO.      b.   Echo 2008: EF 40-50% with mild diffuse            LV hypokinesis and abnormal left ventricular relaxation.  Mild AI.      c.   Echo 8/09 EF 55-60%. Trivial AI     d.   CPX 4/07, pVO2: 20.6 (105% predicted) slope of 42.7.  pRER 1.09.            There was a mild restriction on her spirometry.     e. Echo 4/12 EF 60-65% grade 1 DD. Mild MR/AI      f. Echo 7/15 EF 45-50% global HK. Grade 1 DD. Mild RV dysfx. Mild AI/TR     g. Echo 9/16 EF 50-55% (read as 45-50%) Grade 1 DD. Normal RV Mild AI     H. Echo 12/17 EF 45-50% (looks like 50%), Mild AI, Normal RV  She transitioned her care from Dr Jessica Mcneil, she was last seen in March 2017, pravastatin was added to her regimen as her LDL was 180 with improvement to 120, we tried to increase to 20 mg a day however she couldn't tolerated because of significant muscle pain and decreased back to 10 mg daily. She has been dealing with gout and increase her allopurinol from 100-202 300 mg by mouth twice a day in the last months she had 2 episodes of gout. T  01/31/2017 - patient is coming after 2 months, at the last visit spironolactone 12.5 mg was added to her regimen and she is tolerating it she denies any chest pain, shortness of breath, she uses hydrochlorothiazide for edema as needed and she didn't need to use it in the last 2 months. She denies any orthopnea or proximal nocturnal dyspnea. Since I saw her last  time she felt device both of those were mechanical falls but on one occasion she hit the back of her head with significant bruising but didn't go to the ER.  ROS: All systems negative except as listed in HPI, PMH and Problem List.  Past Medical History:  Diagnosis Date  . Arthritis   . CHF (congestive heart failure) ([place])    due to non ischemic cardiomyopathy  . Depression   . GERD (gastroesophageal reflux disease)   . Headache(784.0)    migraines  . HLD (hyperlipidemia)   . HTN (hypertension)   . Overweight(278.02)   . PONV (postoperative nausea and vomiting)    long time ago none recent  . Shortness of breath     Current Outpatient Prescriptions  Medication Sig Dispense Refill  . allopurinol (ZYLOPRIM) 300 MG tablet Take 1 tablet (300 mg total) by mouth daily. 90 tablet 2  . ALPRAZolam (XANAX) 0.5 MG tablet Take 1 tablet (0.5 mg total) by mouth at bedtime as needed. For sleep/anxiety 90 tablet 0  .  aspirin EC 81 MG tablet Take 81 mg by mouth daily.    [name] CALCIUM-MAGNESIUM-ZINC PO Take 1 tablet by mouth daily.    . colchicine 0.6 MG tablet Take 1 tablet (0.6 mg total) by mouth daily as needed (gouty flare). 30 tablet 1  . docusate sodium (COLACE) 100 MG capsule Take 200 mg by mouth at bedtime.    [name] FLUoxetine (PROZAC) 20 MG capsule Take 2 capsules by mouth once daily. 180 capsule 2  . GARLIC PO Take 1 tablet by mouth 2 (two) times daily.    . hydrochlorothiazide (MICROZIDE) 12.5 MG capsule Take 1 capsule (12.5 mg total) by mouth daily as needed (Swelling). 15 capsule 3  . HYDROcodone-acetaminophen (NORCO/VICODIN) 5-325 MG per tablet Take 1 tablet by mouth every 6 (six) hours as needed. For migraines 30 tablet 0  . metoprolol succinate (TOPROL-XL) 50 MG 24 hr tablet TAKE 1 TABLET BY MOUTH ONCE DAILY WITH OR IMMEDIATELY FOLLOWING A MEAL 90 tablet 3  . Multiple Vitamin (MULTIVITAMIN WITH MINERALS) TABS Take 1 tablet by mouth daily.    . Omega-3 Fatty Acids 1200 MG CAPS Take 1,200 mg  by mouth daily.    [name] omeprazole (PRILOSEC) 20 MG capsule Take 1 capsule (20 mg total) by mouth daily. 90 capsule 1  . pravastatin (PRAVACHOL) 20 MG tablet Take 1 tablet (20 mg total) by mouth every evening. (Patient taking differently: Take 10 mg by mouth every evening. ) 90 tablet 2  . spironolactone (ALDACTONE) 25 MG tablet TAKE 1/2 TABLET ONCE A DAY 45 tablet 3   No current facility-administered medications for this visit.    Vitals:   01/31/17 0824  BP: 114/62  Pulse: 77  Weight: 176 lb (79.8 kg)  Height: 5\' 5"  (1.651 m)    PHYSICAL EXAM: General:  Well appearing. No resp difficulty HEENT: normal Neck: supple. JVP 5-6 Carotids 2+ bilaterally; no bruits. No lymphadenopathy or thryomegaly appreciated. Cor: PMI normal. Regular rate & rhythm. No rubs, murmurs.  Lungs: clear Abdomen: obese soft, nontender, nondistended. No hepatosplenomegaly. No bruits or masses. Good bowel sounds. Extremities: no cyanosis, clubbing, rash, edema Neuro: alert & orientedx3, cranial nerves grossly intact. Moves all 4 extremities w/o difficulty. Affect pleasant.  EKG on 10/31/2016 shows normal sinus rhythm nonspecific T wave abnormalities that are unchanged from 03/12/2016    ASSESSMENT & PLAN:  1) Chronic systolic HF: Nonischemic cardiomyopathy. With prior history of low EF the most recent echo performed a year ago LVEF 50%, we added spironolactone 12.5 mg by mouth daily. She wasn't able to tolerate low-dose ACE-I due to hypotension - Continue Toprol XL 50 mg daily. Continue spironolactone 12.5 mg daily. - HCTZ only as needed, we need to use it, advised to use it sparsely because of history of gout.   2) Essential hypertension -Blood pressure well controlled. Continue current regimen.  3) gout - Patient is told to continue same dose of out Eupora no for another months if she has recurrent episodes she will have to follow with PCP or rheumatology for further suggestions for treatment.  4) HL -  Cholesterol improved with pravastatin 10 mg po daily, decreased from 180-120-137, she was not able to tolerate higher dose of pravastatin.   - Diet and weight loss important.   Follow up in 4 months.  Filiberto Pinks 8:31 AM

## 2017-03-07 ENCOUNTER — Ambulatory Visit
Admission: RE | Admit: 2017-03-07 | Discharge: 2017-03-07 | Disposition: A | Discharge status: Home / Self Care | Payer: Medicare Other | Source: Ambulatory Visit | Attending: Family Medicine | Admitting: Family Medicine

## 2017-03-07 DIAGNOSIS — Z1231 Encounter for screening mammogram for malignant neoplasm of breast: Secondary | ICD-10-CM

## 2017-03-19 DIAGNOSIS — M7541 Impingement syndrome of right shoulder: Secondary | ICD-10-CM | POA: Diagnosis not present

## 2017-04-04 ENCOUNTER — Telehealth: Payer: Self-pay | Admitting: *Deleted

## 2017-04-04 NOTE — Telephone Encounter (Signed)
[facility] 90 day refill request for  ALPRAZolam Duanne Moron) 0.5 MG tablet Last refill 11/28/16.  Last office visit 10/11/16.  Okay to fill?

## 2017-04-06 NOTE — Telephone Encounter (Signed)
Refill once and set up follow up soon.

## 2017-04-07 MED ORDER — ALPRAZOLAM 0.5 MG PO TABS: 0.5000 mg | ORAL_TABLET | Freq: Every evening | ORAL | 0 refills | Status: DC | PRN

## 2017-04-07 NOTE — Telephone Encounter (Signed)
Rx sent and patient will call back for an appointment

## 2017-04-09 ENCOUNTER — Ambulatory Visit: Payer: Medicare Other | Admitting: Family Medicine

## 2017-04-16 ENCOUNTER — Ambulatory Visit (INDEPENDENT_AMBULATORY_CARE_PROVIDER_SITE_OTHER): Payer: Medicare Other | Admitting: Family Medicine

## 2017-04-16 ENCOUNTER — Encounter: Payer: Self-pay | Admitting: Family Medicine

## 2017-04-16 VITALS — BP 110/70 | HR 75 | Temp 98.7°F | Wt 173.9 lb

## 2017-04-16 DIAGNOSIS — G4700 Insomnia, unspecified: Secondary | ICD-10-CM | POA: Diagnosis not present

## 2017-04-16 DIAGNOSIS — Z8659 Personal history of other mental and behavioral disorders: Secondary | ICD-10-CM

## 2017-04-16 DIAGNOSIS — M1000 Idiopathic gout, unspecified site: Secondary | ICD-10-CM | POA: Diagnosis not present

## 2017-04-16 MED ORDER — ZOSTER VAC RECOMB ADJUVANTED 50 MCG/0.5ML IM SUSR: 0.5000 mL | Freq: Once | INTRAMUSCULAR | 1 refills | Status: AC

## 2017-04-16 NOTE — Progress Notes (Signed)
Subjective:     Patient ID: Jessica Mcneil, female   DOB: Apr 12, 1941, 76 y.o.   MRN: [id number]  HPI Patient here to discuss the following issues:  Chronic insomnia. She has difficulty mostly falling asleep. She has for several years taken low-dose alprazolam intermittently- usually about 2 or 3 times per week. She's tried things like melatonin and Tylenol PM in the past without success. No regular alcohol use. No late day use of caffeine. Sometimes watches TV at night. Denies depression symptoms currently.  She is requesting shingles vaccine. Has never had this previously.  History of gout. She is on allopurinol 300 mg daily. Last uric acid 3.9 which improved from 8.2 prior to going on medication. She has frequent foot involvement.  Depression stable on Prozac.  No anhedonia.  No suicidal ideation.  Past Medical History:  Diagnosis Date  . Arthritis   . CHF (congestive heart failure) ([place])    due to non ischemic cardiomyopathy  . Depression   . GERD (gastroesophageal reflux disease)   . Headache(784.0)    migraines  . HLD (hyperlipidemia)   . HTN (hypertension)   . Overweight(278.02)   . PONV (postoperative nausea and vomiting)    long time ago none recent  . Shortness of breath    Past Surgical History:  Procedure Laterality Date  . ANTERIOR AND POSTERIOR REPAIR  03  . APPENDECTOMY    . BELPHAROPTOSIS REPAIR     bilat  . BREAST BIOPSY Right   . BREAST LUMPECTOMY WITH NEEDLE LOCALIZATION Right 02/12/2013   Procedure: BREAST LUMPECTOMY WITH NEEDLE LOCALIZATION;  Surgeon: Merrie Roof, MD;  Location: [place];  Service: General;  Laterality: Right;  . CHOLECYSTECTOMY    . COLONOSCOPY    . EYE SURGERY     Glaucoma Implant  . TONSILLECTOMY    . VAGINAL HYSTERECTOMY  1998    reports that she quit smoking about 34 years ago. Her smoking use included Cigarettes. She smoked 2.00 packs per day. She has never used smokeless tobacco. She reports that she does  not drink alcohol or use drugs. family history includes Heart disease in her father. Allergies  Allergen Reactions  . Codeine     REACTION: itiching  . Erythromycin     REACTION: GI upset  . Lipitor [Atorvastatin] Other (See Comments)  . Sulfamethoxazole Itching    Over 40 years ago     Review of Systems  Constitutional: Negative for fatigue.  Eyes: Negative for visual disturbance.  Respiratory: Negative for cough, chest tightness, shortness of breath and wheezing.   Cardiovascular: Negative for chest pain, palpitations and leg swelling.  Genitourinary: Negative for dysuria.  Neurological: Negative for dizziness, seizures, syncope, weakness, light-headedness and headaches.  Psychiatric/Behavioral: Positive for sleep disturbance.       Objective:   Physical Exam  Constitutional: She appears well-developed and well-nourished.  Eyes: Pupils are equal, round, and reactive to light.  Neck: Neck supple. No JVD present. No thyromegaly present.  Cardiovascular: Normal rate and regular rhythm.  Exam reveals no gallop.   Pulmonary/Chest: Effort normal and breath sounds normal. No respiratory distress. She has no wheezes. She has no rales.  Musculoskeletal: She exhibits no edema.  Neurological: She is alert.       Assessment:     #1 chronic insomnia  #2 history of gout with intermittent flareups even on allopurinol  #3 health maintenance reviewed requesting new shingles vaccine  #4 hx of depression stable on Prozac.  Plan:     -Prescription for Shingrix provided -Long discussion regarding insomnia. We discussed nonpharmacologic management. Handout given. We recommended she try to taper off alprazolam as much as possible. We discussed long-term risks including higher risk of fall and possible cognitive impairment. -Patient has pending appointment with rheumatologist to discuss her gout issues. We discussed dietary triggers. Stressed importance of adequate hydration. Continue  with Allopurinol.  She take HCTZ- but infrequently.  Eulas Post MD [facility] at [facility]

## 2017-04-16 NOTE — Patient Instructions (Signed)
Insomnia Insomnia is a sleep disorder that makes it difficult to fall asleep or to stay asleep. Insomnia can cause tiredness (fatigue), low energy, difficulty concentrating, mood swings, and poor performance at work or school. There are three different ways to classify insomnia:  Difficulty falling asleep.  Difficulty staying asleep.  Waking up too early in the morning. Any type of insomnia can be long-term (chronic) or short-term (acute). Both are common. Short-term insomnia usually lasts for three months or less. Chronic insomnia occurs at least three times a week for longer than three months. What are the causes? Insomnia may be caused by another condition, situation, or substance, such as:  Anxiety.  Certain medicines.  Gastroesophageal reflux disease (GERD) or other gastrointestinal conditions.  Asthma or other breathing conditions.  Restless legs syndrome, sleep apnea, or other sleep disorders.  Chronic pain.  Menopause. This may include hot flashes.  Stroke.  Abuse of alcohol, tobacco, or illegal drugs.  Depression.  Caffeine.  Neurological disorders, such as Alzheimer disease.  An overactive thyroid (hyperthyroidism). The cause of insomnia may not be known. What increases the risk? Risk factors for insomnia include:  Gender. Women are more commonly affected than men.  Age. Insomnia is more common as you get older.  Stress. This may involve your professional or personal life.  Income. Insomnia is more common in people with lower income.  Lack of exercise.  Irregular work schedule or night shifts.  Traveling between different time zones. What are the signs or symptoms? If you have insomnia, trouble falling asleep or trouble staying asleep is the main symptom. This may lead to other symptoms, such as:  Feeling fatigued.  Feeling nervous about going to sleep.  Not feeling rested in the morning.  Having trouble concentrating.  Feeling irritable,  anxious, or depressed. How is this treated? Treatment for insomnia depends on the cause. If your insomnia is caused by an underlying condition, treatment will focus on addressing the condition. Treatment may also include:  Medicines to help you sleep.  Counseling or therapy.  Lifestyle adjustments. Follow these instructions at home:  Take medicines only as directed by your health care provider.  Keep regular sleeping and waking hours. Avoid naps.  Keep a sleep diary to help you and your health care provider figure out what could be causing your insomnia. Include:  When you sleep.  When you wake up during the night.  How well you sleep.  How rested you feel the next day.  Any side effects of medicines you are taking.  What you eat and drink.  Make your bedroom a comfortable place where it is easy to fall asleep:  Put up shades or special blackout curtains to block light from outside.  Use a white noise machine to block noise.  Keep the temperature cool.  Exercise regularly as directed by your health care provider. Avoid exercising right before bedtime.  Use relaxation techniques to manage stress. Ask your health care provider to suggest some techniques that may work well for you. These may include:  Breathing exercises.  Routines to release muscle tension.  Visualizing peaceful scenes.  Cut back on alcohol, caffeinated beverages, and cigarettes, especially close to bedtime. These can disrupt your sleep.  Do not overeat or eat spicy foods right before bedtime. This can lead to digestive discomfort that can make it hard for you to sleep.  Limit screen use before bedtime. This includes:  Watching TV.  Using your smartphone, tablet, and computer.  Stick to a   routine. This can help you fall asleep faster. Try to do a quiet activity, brush your teeth, and go to bed at the same time each night.  Get out of bed if you are still awake after 15 minutes of trying to  sleep. Keep the lights down, but try reading or doing a quiet activity. When you feel sleepy, go back to bed.  Make sure that you drive carefully. Avoid driving if you feel very sleepy.  Keep all follow-up appointments as directed by your health care provider. This is important. Contact a health care provider if:  You are tired throughout the day or have trouble in your daily routine due to sleepiness.  You continue to have sleep problems or your sleep problems get worse. Get help right away if:  You have serious thoughts about hurting yourself or someone else. This information is not intended to replace advice given to you by your health care provider. Make sure you discuss any questions you have with your health care provider. Document Released: 12/06/2000 Document Revised: 05/10/2016 Document Reviewed: 09/09/2014 Elsevier Interactive Patient Education  2017 [employer].  Try to taper off Alprazolam as much as possible.

## 2017-04-16 NOTE — Progress Notes (Signed)
Pre visit review using our clinic review tool, if applicable. No additional management support is needed unless otherwise documented below in the visit note. 

## 2017-04-18 ENCOUNTER — Other Ambulatory Visit: Payer: Self-pay | Admitting: Family Medicine

## 2017-04-27 NOTE — Progress Notes (Signed)
Office Visit Note  Patient: Jessica Mcneil             Date of Birth: 1941/03/19           MRN: [id number]             PCP: Eulas Post, MD Referring: Marchia Bond, MD Visit Date: 05/02/2017 Occupation: @GUAROCC @    Subjective:  Left foot pain.   History of Present Illness: Jessica Mcneil is a 76 y.o. female seen in consultation per request of Dr. Mardelle Matte for evaluation of possible gout. According to patient she was diagnosed with gout about 7 years ago. At the time she had pain and discomfort in her right foot she was treated with colchicine for elevated uric acid. She states over the years that attacks became more frequent and over the last year she's been having attacks almost every month. She was started on allopurinol about 7 months ago at the time her uric acid was 8.9. She took colchicine only on when necessary basis. She states she's had 3 flares in the last 6 months. The last flare was about 3 weeks ago which was in her left foot. At the time she was at Dr. Luanna Cole office and he gave her oral prednisone. She states the swelling is going down and getting better. She continues to be on allopurinol and has not taken colchicine. She states colchicine is very expensive. She also have pain in her hands which she believes is secondary to osteoarthritis. She has chronic right shoulder joint pain secondary to a fall in the past for which she's been seeing Dr. Mardelle Matte. None of the other joints are painful.  Activities of Daily Living:  Patient reports morning stiffness for 10 minutes.   Patient Denies nocturnal pain.  Difficulty dressing/grooming: Denies Difficulty climbing stairs: Denies Difficulty getting out of chair: Denies Difficulty using hands for taps, buttons, cutlery, and/or writing: Reports   Review of Systems  Constitutional: Positive for fatigue. Negative for night sweats, weight gain, weight loss and weakness.  HENT: Negative for mouth sores, trouble  swallowing, trouble swallowing, mouth dryness and nose dryness.   Eyes: Negative for pain, redness, visual disturbance and dryness.  Respiratory: Negative for cough, shortness of breath and difficulty breathing.   Cardiovascular: Positive for palpitations. Negative for chest pain, hypertension, irregular heartbeat and swelling in legs/feet.  Gastrointestinal: Negative for blood in stool, constipation and diarrhea.  Endocrine: Negative for increased urination.  Genitourinary: Negative for vaginal dryness.  Musculoskeletal: Positive for arthralgias, joint pain and joint swelling. Negative for myalgias, muscle weakness, morning stiffness, muscle tenderness and myalgias.  Skin: Negative for color change, rash, hair loss, skin tightness, ulcers and sensitivity to sunlight.  Allergic/Immunologic: Negative for susceptible to infections.  Neurological: Negative for dizziness, memory loss and night sweats.  Hematological: Negative for swollen glands.  Psychiatric/Behavioral: Negative for depressed mood and sleep disturbance. The patient is nervous/anxious.     PMFS History:  Patient Active Problem List   Diagnosis Date Noted  . Gout 02/13/2016  . Migraine headache with aura 05/08/2015  . Chronic systolic heart failure ([place]) 07/18/2014  . HTN (hypertension) 08/03/2013  . Abnormal mammogram with microcalcification 01/25/2013  . Dyspnea 03/14/2011  . HEADACHE 06/21/2010  . ACUTE CHRONIC COMB SYSTOLIC&DIASTOLIC HEART FAIL 40/34/7425  . History of depression 08/04/2009  . Insomnia 08/04/2009  . CONSTIPATION 06/22/2009  . Hyperlipidemia 02/23/2009  . 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 Arthritis   . CHF (congestive heart failure) ([place])    due to non ischemic cardiomyopathy  . Depression   . GERD (gastroesophageal reflux disease)   . Headache(784.0)    migraines  . HLD (hyperlipidemia)   . HTN  (hypertension)   . Overweight(278.02)   . PONV (postoperative nausea and vomiting)    long time ago none recent  . Shortness of breath     Family History  Problem Relation Age of Onset  . Heart disease Father   . Arthritis Unknown   . Hyperlipidemia Unknown    Past Surgical History:  Procedure Laterality Date  . ANTERIOR AND POSTERIOR REPAIR  03  . APPENDECTOMY    . BELPHAROPTOSIS REPAIR     bilat  . BREAST BIOPSY Right   . BREAST LUMPECTOMY WITH NEEDLE LOCALIZATION Right 02/12/2013   Procedure: BREAST LUMPECTOMY WITH NEEDLE LOCALIZATION;  Surgeon: Merrie Roof, MD;  Location: [facility];  Service: General;  Laterality: Right;  . CHOLECYSTECTOMY    . COLONOSCOPY    . EYE SURGERY     Glaucoma Implant  . TONSILLECTOMY    . VAGINAL HYSTERECTOMY  1998   Social History   Social History Narrative  . No narrative on file     Objective: Vital Signs: BP 118/74   Pulse 78   Resp 16   Ht 5\' 7"  (1.702 m)   Wt 176 lb (79.8 kg)   BMI 27.57 kg/m    Physical Exam  Constitutional: She is oriented to person, place, and time. She appears well-developed and well-nourished.  HENT:  Head: Normocephalic and atraumatic.  Eyes: Conjunctivae and EOM are normal.  Neck: Normal range of motion.  Cardiovascular: Normal rate, regular rhythm, normal heart sounds and intact distal pulses.   Pulmonary/Chest: Effort normal and breath sounds normal.  Abdominal: Soft. Bowel sounds are normal.  Lymphadenopathy:    She has no cervical adenopathy.  Neurological: She is alert and oriented to person, place, and time.  Skin: Skin is warm and dry. Capillary refill takes less than 2 seconds.  Psychiatric: She has a normal mood and affect. Her behavior is normal.  Nursing note and vitals reviewed.    Musculoskeletal Exam: C-spine and thoracic lumbar spine good range of motion. Shoulder joints elbow joints wrist joints are good range of motion. She is some discomfort range of motion of  her right shoulder. She had DIP PIP thickening in bilateral hands and also thickening of her left third MCP joint. Hip joints knee joints ankle joints are good range of motion. She had warmth swelling and tenderness in her left first MTP joint with peeling of the skin. PIP/DIP thickening in her feet was also noted.  CDAI Exam: No CDAI exam completed.    Investigation: Findings:  10/11/2016 Uric Acid 3.9 , was elevated 8.2  On 02/20/2016      Imaging: Xr Foot 2 Views Left  Result Date: 05/02/2017 First MTP narrowing all PIP/DIP narrowing small inferior calcaneal spur was noted. Impression these findings are consistent with osteoarthritis  Xr Foot 2 Views Right  Result Date: 05/02/2017 Right first MTP severe narrowing with cystic versus erosive changes. She has all PIP/DIP narrowing with spurring. Both inferior and posterior calcaneal spurring were also noted. Impression these findings are consistent with osteoarthritis.  Xr Hand 2 View Left  Result Date: 05/02/2017 Left second and third MCP narrowing third MCP severe narrowing with hanging osteophyte. She has all PIP/DIP narrowing  with erosive changes and some of the DIP joints. She has left CMC narrowing. Findings are consistent with severe osteoarthritis of the MCP narrowing could be seen with inflammatory arthritis and crystal-induced arthropathy.  Xr Hand 2 View Right  Result Date: 05/02/2017 Right third MCP narrowing severe CMC narrowing all PIP/DIP narrowing with some erosive changes in the first and second DIP joints. Impression: These findings are consistent with severe osteoarthritis. The MCP narrowing could be seen with inflammatory arthritis or crystal-induced arthropathy.   Speciality Comments: No specialty comments available.    Procedures:  No procedures performed Allergies: Codeine; Erythromycin; Lipitor [atorvastatin]; and Sulfamethoxazole   Assessment / Plan:     Visit Diagnoses: Idiopathic chronic gout of  multiple sites without tophus: Patient had a recent flare with the left first MTP pain and swelling which responded to prednisone. She still have some residual swelling. Her last uric acid and October was in desirable range. She's not been taking colchicine due to the cost. Although she states on allopurinol. She reports flares on allopurinol almost every other month.  Chronic right shoulder pain - Followed up by Dr. Mardelle Matte. Patient reports she had right rotator cuff tear at one time.   Pain in both hands - Plan: XR Hand 2 View Right, XR Hand 2 View Left  Pain in both feet - Plan: XR Foot 2 Views Right, XR Foot 2 Views Left, Sedimentation rate, Rheumatoid factor, Cyclic citrul peptide antibody, IgG, Uric acid  Medication monitoring encounter - Plan: CBC with Differential/Platelet, COMPLETE METABOLIC PANEL WITH GFR, Urinalysis, Routine w reflex microscopic  Primary insomnia: Better on medication  History of hyperlipidemia  History of depression  History of migraine  History of hypertension  History of heart failure  Orders: Orders Placed This Encounter  Procedures  . XR Foot 2 Views Right  . XR Foot 2 Views Left  . XR Hand 2 View Right  . XR Hand 2 View Left  . CBC with Differential/Platelet  . COMPLETE METABOLIC PANEL WITH GFR  . Urinalysis, Routine w reflex microscopic  . Sedimentation rate  . Rheumatoid factor  . Cyclic citrul peptide antibody, IgG  . Uric acid  . Iron and TIBC  . Ferritin  . Magnesium   No orders of the defined types were placed in this encounter.   Face-to-face time spent with patient was 50 minutes. 50% of time was spent in counseling and coordination of care.  Follow-Up Instructions: Return for Gout,OA.   Bo Merino, MD  Note - This record has been created using Editor, commissioning.  Chart creation errors have been sought, but may not always  have been located. Such creation errors do not reflect on  the standard of medical care.

## 2017-05-02 ENCOUNTER — Encounter: Payer: Self-pay | Admitting: Rheumatology

## 2017-05-02 ENCOUNTER — Ambulatory Visit (INDEPENDENT_AMBULATORY_CARE_PROVIDER_SITE_OTHER): Payer: Medicare Other

## 2017-05-02 ENCOUNTER — Telehealth: Payer: Self-pay | Admitting: Pharmacist

## 2017-05-02 ENCOUNTER — Ambulatory Visit (INDEPENDENT_AMBULATORY_CARE_PROVIDER_SITE_OTHER): Payer: Medicare Other | Admitting: Rheumatology

## 2017-05-02 ENCOUNTER — Telehealth: Payer: Self-pay | Admitting: Radiology

## 2017-05-02 VITALS — BP 118/74 | HR 78 | Resp 16 | Ht 67.0 in | Wt 176.0 lb

## 2017-05-02 DIAGNOSIS — G8929 Other chronic pain: Secondary | ICD-10-CM | POA: Diagnosis not present

## 2017-05-02 DIAGNOSIS — M79672 Pain in left foot: Secondary | ICD-10-CM

## 2017-05-02 DIAGNOSIS — Z5181 Encounter for therapeutic drug level monitoring: Secondary | ICD-10-CM | POA: Diagnosis not present

## 2017-05-02 DIAGNOSIS — M79671 Pain in right foot: Secondary | ICD-10-CM

## 2017-05-02 DIAGNOSIS — M1A09X0 Idiopathic chronic gout, multiple sites, without tophus (tophi): Secondary | ICD-10-CM | POA: Diagnosis not present

## 2017-05-02 DIAGNOSIS — Z8679 Personal history of other diseases of the circulatory system: Secondary | ICD-10-CM | POA: Diagnosis not present

## 2017-05-02 DIAGNOSIS — Z8659 Personal history of other mental and behavioral disorders: Secondary | ICD-10-CM

## 2017-05-02 DIAGNOSIS — Z8639 Personal history of other endocrine, nutritional and metabolic disease: Secondary | ICD-10-CM

## 2017-05-02 DIAGNOSIS — M79642 Pain in left hand: Secondary | ICD-10-CM | POA: Diagnosis not present

## 2017-05-02 DIAGNOSIS — M79641 Pain in right hand: Secondary | ICD-10-CM

## 2017-05-02 DIAGNOSIS — M25511 Pain in right shoulder: Secondary | ICD-10-CM | POA: Diagnosis not present

## 2017-05-02 DIAGNOSIS — Z8669 Personal history of other diseases of the nervous system and sense organs: Secondary | ICD-10-CM | POA: Diagnosis not present

## 2017-05-02 DIAGNOSIS — F5101 Primary insomnia: Secondary | ICD-10-CM

## 2017-05-02 LAB — COMPLETE METABOLIC PANEL WITH GFR
ALK PHOS: 70 U/L (ref 33–130)
ALT: 16 U/L (ref 6–29)
AST: 15 U/L (ref 10–35)
Albumin: 4 g/dL (ref 3.6–5.1)
BILIRUBIN TOTAL: 0.4 mg/dL (ref 0.2–1.2)
BUN: 13 mg/dL (ref 7–25)
CHLORIDE: 105 mmol/L (ref 98–110)
CO2: 25 mmol/L (ref 20–31)
CREATININE: 0.83 mg/dL (ref 0.60–0.93)
Calcium: 9.2 mg/dL (ref 8.6–10.4)
GFR, EST AFRICAN AMERICAN: 80 mL/min (ref >=60)
GFR, Est Non African American: 70 mL/min (ref >=60)
GLUCOSE: 116 mg/dL — AB (ref 65–99)
POTASSIUM: 4.6 mmol/L (ref 3.5–5.3)
SODIUM: 140 mmol/L (ref 135–146)
TOTAL PROTEIN: 6.5 g/dL (ref 6.1–8.1)

## 2017-05-02 LAB — CBC WITH DIFFERENTIAL/PLATELET
BASOS ABS: 74 {cells}/uL (ref 0–200)
Basophils Relative: 1 %
EOS PCT: 7 %
Eosinophils Absolute: 518 cells/uL — ABNORMAL HIGH (ref 15–500)
HCT: 43.7 % (ref 35.0–45.0)
Hemoglobin: 14.2 g/dL (ref 11.7–15.5)
LYMPHS PCT: 30 %
Lymphs Abs: 2220 cells/uL (ref 850–3900)
MCH: 29.8 pg (ref 27.0–33.0)
MCHC: 32.5 g/dL (ref 32.0–36.0)
MCV: 91.8 fL (ref 80.0–100.0)
MONOS PCT: 9 %
MPV: 10 fL (ref 7.5–12.5)
Monocytes Absolute: 666 cells/uL (ref 200–950)
NEUTROS ABS: 3922 {cells}/uL (ref 1500–7800)
Neutrophils Relative %: 53 %
PLATELETS: 285 10*3/uL (ref 140–400)
RBC: 4.76 MIL/uL (ref 3.80–5.10)
RDW: 16.1 % — AB (ref 11.0–15.0)
WBC: 7.4 10*3/uL (ref 3.8–10.8)

## 2017-05-02 LAB — IRON AND TIBC
%SAT: 30 % (ref 11–50)
Iron: 98 ug/dL (ref 45–160)
TIBC: 331 ug/dL (ref 250–450)
UIBC: 233 ug/dL (ref 125–400)

## 2017-05-02 MED ORDER — COLCHICINE 0.6 MG PO TABS: 0.6000 mg | ORAL_TABLET | Freq: Every day | ORAL | 5 refills | Status: DC

## 2017-05-02 NOTE — Progress Notes (Signed)
Pharmacy Note  Subjective:  Patient presents today to the [facility] to see Dr. Estanislado Pandy.  Patient is currently taking allopurinol 300 mg daily and colchicine as needed for gout.  Patient was advised to continue allopurinol.  She was also asked to start taking colchicine daily by Dr. Estanislado Pandy.  I was asked to talk to the patient regarding colchicine cost.    Objective: CBC    Component Value Date/Time   WBC 7.0 02/20/2016 0840   RBC 5.23 (H) 02/20/2016 0840   HGB 15.2 (H) 02/20/2016 0840   HCT 45.4 02/20/2016 0840   PLT 324.0 02/20/2016 0840   MCV 86.8 02/20/2016 0840   MCH 30.5 07/18/2014 1120   MCHC 33.5 02/20/2016 0840   RDW 14.1 02/20/2016 0840   LYMPHSABS 2.5 02/20/2016 0840   MONOABS 0.8 02/20/2016 0840   EOSABS 0.4 02/20/2016 0840   BASOSABS 0.1 02/20/2016 0840   CMP     Component Value Date/Time   NA 140 04/24/2016 0752   K 4.4 04/24/2016 0752   CL 102 04/24/2016 0752   CO2 28 04/24/2016 0752   GLUCOSE 106 (H) 04/24/2016 0752   BUN 13 04/24/2016 0752   CREATININE 0.83 04/24/2016 0752   CALCIUM 8.8 04/24/2016 0752   PROT 6.6 04/24/2016 0752   ALBUMIN 3.8 04/24/2016 0752   AST 14 04/24/2016 0752   ALT 14 04/24/2016 0752   ALKPHOS 75 04/24/2016 0752   BILITOT 0.4 04/24/2016 0752   GFRNONAA 60 (L) 12/29/2014 1426   GFRAA 69 (L) 12/29/2014 1426  Updated CMP ordered today  Uric Acid:  3.9 (10/11/16)  Assessment/Plan:  Counseled patient on the purpose proper use, and adverse effects of colchicine.  Noted patient is also on statin therapy.  Discussed the increased risk of muscle aching/weakness on colchicine and statin therapy and advised patient to call us if they occurs.  Provided patient with medication education material and answered all questions.    I had a detailed discussion with patient regarding colchicine cost.  Patient reports that colchicine was almost $300 through her mail order pharmacy.  I reviewed patient's insurance information  on [email or web address]  It appears colchicine tablets, colchicine capsules, and Colcrys are all tier 3 under patient's insurance.  Mitigare is not on formulary.  Advised that the tier 3 medications should be $45 for 30 day supply or $125 for 90 day supply.  Patient does have annual drug deductible which may have made the copay more expensive at the beginning of the year.  Patient confirms she will be able to afford those copays.  I did inform patient of the [employer] patient assistance program to get Colcrys for free if she has difficulty affording the medication.  Patient voiced understanding but did not want to apply at this time.    I provided patient with a sample of Mitigare during today's visit and new prescription for colchicine was sent to her pharmacy.  I advised patient to call me if she is unable to afford the copay for colchicine.    Drug name: Mitigare       Strength: 0.6 mg        Qty: 15 capsules  LOT: [id number]  Exp.Date: 2/19  Dosing instructions: Take 1 capsule by mouth daily.    The patient has been instructed regarding the correct time, dose, and frequency of taking this medication, including desired effects and most common side effects.   Cyndia Skeeters 9:28 AM 05/02/2017   Elisabeth Most, Pharm.D., BCPS, CPP Clinical  Pharmacist Pager: [phone] Phone: [phone] 05/02/2017 9:22 AM

## 2017-05-02 NOTE — Patient Instructions (Signed)
Gout Gout is painful swelling that can happen in some of your joints. Gout is a type of arthritis. This condition is caused by having too much uric acid in your body. Uric acid is a chemical that is made when your body breaks down substances called purines. If your body has too much uric acid, sharp crystals can form and build up in your joints. This causes pain and swelling. Gout attacks can happen quickly and be very painful (acute gout). Over time, the attacks can affect more joints and happen more often (chronic gout). Follow these instructions at home: During a Gout Attack   If directed, put ice on the painful area:  Put ice in a plastic bag.  Place a towel between your skin and the bag.  Leave the ice on for 20 minutes, 2-3 times a day.  Rest the joint as much as possible. If the joint is in your leg, you may be given crutches to use.  Raise (elevate) the painful joint above the level of your heart as often as you can.  Drink enough fluids to keep your pee (urine) clear or pale yellow.  Take over-the-counter and prescription medicines only as told by your doctor.  Do not drive or use heavy machinery while taking prescription pain medicine.  Follow instructions from your doctor about what you can or cannot eat and drink.  Return to your normal activities as told by your doctor. Ask your doctor what activities are safe for you. Avoiding Future Gout Attacks   Follow a low-purine diet as told by a specialist (dietitian) or your doctor. Avoid foods and drinks that have a lot of purines, such as:  Liver.  Kidney.  Anchovies.  Asparagus.  Herring.  Mushrooms  Mussels.  Beer.  Limit alcohol intake to no more than 1 drink a day for nonpregnant women and 2 drinks a day for men. One drink equals 12 oz of beer, 5 oz of wine, or 1 oz of hard liquor.  Stay at a healthy weight or lose weight if you are overweight. If you want to lose weight, talk with your doctor. It is  important that you do not lose weight too fast.  Start or continue an exercise plan as told by your doctor.  Drink enough fluids to keep your pee clear or pale yellow.  Take over-the-counter and prescription medicines only as told by your doctor.  Keep all follow-up visits as told by your doctor. This is important. Contact a doctor if:  You have another gout attack.  You still have symptoms of a gout attack after10 days of treatment.  You have problems (side effects) because of your medicines.  You have chills or a fever.  You have burning pain when you pee (urinate).  You have pain in your lower back or belly. Get help right away if:  You have very bad pain.  Your pain cannot be controlled.  You cannot pee. This information is not intended to replace advice given to you by your health care provider. Make sure you discuss any questions you have with your health care provider. Document Released: 09/17/2008 Document Revised: 05/16/2016 Document Reviewed: 09/21/2015 Elsevier Interactive Patient Education  2017 [employer] are compounds that affect the level of uric acid in your body. A low-purine diet is a diet that is low in purines. Eating a low-purine diet can prevent the level of uric acid in your body from getting too high and causing gout  or kidney stones or both. What do I need to know about this diet?  Choose low-purine foods. Examples of low-purine foods are listed in the next section.  Drink plenty of fluids, especially water. Fluids can help remove uric acid from your body. Try to drink 8-16 cups (1.9-3.8 L) a day.  Limit foods high in fat, especially saturated fat, as fat makes it harder for the body to get rid of uric acid. Foods high in saturated fat include pizza, cheese, ice cream, whole milk, fried foods, and gravies. Choose foods that are lower in fat and lean sources of protein. Use olive oil when cooking as it contains healthy fats  that are not high in saturated fat.  Limit alcohol. Alcohol interferes with the elimination of uric acid from your body. If you are having a gout attack, avoid all alcohol.  Keep in mind that different people's bodies react differently to different foods. You will probably learn over time which foods do or do not affect you. If you discover that a food tends to cause your gout to flare up, avoid eating that food. You can more freely enjoy foods that do not cause problems. If you have any questions about a food item, talk to your dietitian or health care provider. Which foods are low, moderate, and high in purines? The following is a list of foods that are low, moderate, and high in purines. You can eat any amount of the foods that are low in purines. You may be able to have small amounts of foods that are moderate in purines. Ask your health care provider how much of a food moderate in purines you can have. Avoid foods high in purines. Grains   Foods low in purines: Enriched white bread, pasta, rice, cake, cornbread, popcorn.  Foods moderate in purines: Whole-grain breads and cereals, wheat germ, bran, oatmeal. Uncooked oatmeal. Dry wheat bran or wheat germ.  Foods high in purines: Pancakes, [place] toast, biscuits, muffins. Vegetables   Foods low in purines: All vegetables, except those that are moderate in purines.  Foods moderate in purines: Asparagus, cauliflower, spinach, mushrooms, green peas. Fruits   All fruits are low in purines. Meats and other Protein Foods   Foods low in purines: Eggs, nuts, peanut butter.  Foods moderate in purines: 80-90% lean beef, lamb, veal, pork, poultry, fish, eggs, peanut butter, nuts. Crab, lobster, oysters, and shrimp. Cooked dried beans, peas, and lentils.  Foods high in purines: Anchovies, sardines, herring, mussels, tuna, codfish, scallops, trout, and haddock. Berniece Salines. Organ meats (such as liver or kidney). Tripe. Game meat. Goose.  Sweetbreads. Dairy   All dairy foods are low in purines. Low-fat and fat-free dairy products are best because they are low in saturated fat. Beverages   Drinks low in purines: Water, carbonated beverages, tea, coffee, cocoa.  Drinks moderate in purines: Soft drinks and other drinks sweetened with high-fructose corn syrup. Juices. To find whether a food or drink is sweetened with high-fructose corn syrup, look at the ingredients list.  Drinks high in purines: Alcoholic beverages (such as beer). Condiments   Foods low in purines: Salt, herbs, olives, pickles, relishes, vinegar.  Foods moderate in purines: Butter, margarine, oils, mayonnaise. Fats and Oils   Foods low in purines: All types, except gravies and sauces made with meat.  Foods high in purines: Gravies and sauces made with meat. Other Foods   Foods low in purines: Sugars, sweets, gelatin. Cake. Soups made without meat.  Foods moderate in purines: Meat-based  or fish-based soups, broths, or bouillons. Foods and drinks sweetened with high-fructose corn syrup.  Foods high in purines: High-fat desserts (such as ice cream, cookies, cakes, pies, doughnuts, and chocolate). Contact your dietitian for more information on foods that are not listed here.  This information is not intended to replace advice given to you by your health care provider. Make sure you discuss any questions you have with your health care provider. Document Released: 04/05/2011 Document Revised: 05/16/2016 Document Reviewed: 11/15/2013 Elsevier Interactive Patient Education  2017 Else[employer] Inc.  Allopurinol tablets What is this medicine? ALLOPURINOL (al oh PURE i nole) reduces the amount of uric acid the body makes. It is used to treat the symptoms of gout. It is also used to treat or prevent high uric acid levels that occur as a result of certain types of chemotherapy. This medicine may also help patients who frequently have kidney stones. This medicine may  be used for other purposes; ask your health care provider or pharmacist if you have questions. COMMON BRAND NAME(S): Zyloprim What should I tell my health care provider before I take this medicine? They need to know if you have any of these conditions: -kidney or liver disease -an unusual or allergic reaction to allopurinol, other medicines, foods, dyes, or preservatives -pregnant or trying to get pregnant -breast feeding How should I use this medicine? Take this medicine by mouth with a glass of water. Follow the directions on the prescription label. If this medicine upsets your stomach, take it with food or milk. Take your doses at regular intervals. Do not take your medicine more often than directed. Talk to your pediatrician regarding the use of this medicine in children. Special care may be needed. While this drug may be prescribed for children as young as 6 years for selected conditions, precautions do apply. Overdosage: If you think you have taken too much of this medicine contact a poison control center or emergency room at once. NOTE: This medicine is only for you. Do not share this medicine with others. What if I miss a dose? If you miss a dose, take it as soon as you can. If it is almost time for your next dose, take only that dose. Do not take double or extra doses. What may interact with this medicine? Do not take this medicine with the following medication: -didanosine, ddI This medicine may also interact with the following medications: -amoxicillin or ampicillin -azathioprine -certain medicines used to treat gout -certain types of diuretics -chlorpropamide -cyclosporine -dicumarol -mercaptopurine -tolbutamide -warfarin This list may not describe all possible interactions. Give your health care provider a list of all the medicines, herbs, non-prescription drugs, or dietary supplements you use. Also tell them if you smoke, drink alcohol, or use illegal drugs. Some items may  interact with your medicine. What should I watch for while using this medicine? Visit your doctor or health care professional for regular checks on your progress. If you are taking this medicine to treat gout, you may not have less frequent attacks at first. Keep taking your medicine regularly and the attacks should get better within 2 to 6 weeks. Drink plenty of water (10 to 12 full glasses a day) while you are taking this medicine. This will help to reduce stomach upset and reduce the risk of getting gout or kidney stones. Call your doctor or health care professional at once if you get a skin rash together with chills, fever, sore throat, or nausea and vomiting, if you have blood  in your urine, or difficulty passing urine. Do not take vitamin C without asking your doctor or health care professional. Too much vitamin C can increase the chance of getting kidney stones. You may get drowsy or dizzy. Do not drive, use machinery, or do anything that needs mental alertness until you know how this drug affects you. Do not stand or sit up quickly, especially if you are an older patient. This reduces the risk of dizzy or fainting spells. Alcohol can make you more drowsy and dizzy. Alcohol can also increase the chance of stomach problems and increase the amount of uric acid in your blood. Avoid alcoholic drinks. What side effects may I notice from receiving this medicine? Side effects that you should report to your doctor or health care professional as soon as possible: -allergic reactions like skin rash, itching or hives, swelling of the face, lips, or tongue -breathing problems -muscle aches or pains -redness, blistering, peeling or loosening of the skin, including inside the mouth Side effects that usually do not require medical attention (report to your doctor or health care professional if they continue or are bothersome): -changes in taste -diarrhea -indigestion -stomach pain or cramps This list may  not describe all possible side effects. Call your doctor for medical advice about side effects. You may report side effects to FDA at 1-800-FDA-1088. Where should I keep my medicine? Keep out of the reach of children. Store at room temperature between 15 and 25 degrees C (59 and 77 degrees F). Protect from light and moisture. Throw away any unused medicine after the expiration date. NOTE: This sheet is a summary. It may not cover all possible information. If you have questions about this medicine, talk to your doctor, pharmacist, or health care provider.  2018 Elsevier/Gold Standard (2008-06-13 14:26:54)  Colchicine tablets or capsules What is this medicine? COLCHICINE (KOL chi seen) is for joint pain and swelling due to attacks of acute gouty arthritis. The medicine is also used to treat familial Mediterranean fever. This medicine may be used for other purposes; ask your health care provider or pharmacist if you have questions. COMMON BRAND NAME(S): Colcrys, MITIGARE What should I tell my health care provider before I take this medicine? They need to know if you have any of these conditions: -anemia -blood disorders like leukemia or lymphoma -heart disease -immune system problems -intestinal disease -kidney disease -liver disease -muscle pain or weakness -take other medicines -stomach problems -an unusual or allergic reaction to colchicine, other medicines, lactose, foods, dyes, or preservatives -pregnant or trying to get pregnant -breast-feeding How should I use this medicine? Take this medicine by mouth with a full glass of water. Follow the directions on the prescription label. You can take it with or without food. If it upsets your stomach, take it with food. Take your medicine at regular intervals. Do not take your medicine more often than directed. A special MedGuide will be given to you by the pharmacist with each prescription and refill. Be sure to read this information  carefully each time. Talk to your pediatrician regarding the use of this medicine in children. While this drug may be prescribed for children as young as 61 years old for selected conditions, precautions do apply. Patients over 25 years old may have a stronger reaction and need a smaller dose. Overdosage: If you think you have taken too much of this medicine contact a poison control center or emergency room at once. NOTE: This medicine is only for you. Do not  share this medicine with others. What if I miss a dose? If you miss a dose, take it as soon as you can. If it is almost time for your next dose, take only that dose. Do not take double or extra doses. What may interact with this medicine? Do not take this medicine with any of the following medications: -certain medicines for fungal infections like itraconazole This medicine may also interact with the following medications: -alcohol -certain medicines for cholesterol like atorvastatin -certain medicines for coughs and colds -certain medicines to help you breathe better -cyclosporine -digoxin -epinephrine -grapefruit or grapefruit juice -methenamine -other medicines for fungal infection -sodium bicarbonate -some antibiotics like clarithromycin, erythromycin, and telithromycin -some medicines for an irregular heartbeat or other heart problems -some medicines for cancer, like lapatinib and tamoxifen -some medicines for HIV This list may not describe all possible interactions. Give your health care provider a list of all the medicines, herbs, non-prescription drugs, or dietary supplements you use. Also tell them if you smoke, drink alcohol, or use illegal drugs. Some items may interact with your medicine. What should I watch for while using this medicine? Visit your doctor or health care professional for regular checks on your progress. You may need periodic blood checks. Alcohol can increase the chance of getting stomach problems and  gout attacks. Do not drink alcohol. What side effects may I notice from receiving this medicine? Side effects that you should report to your doctor or health care professional as soon as possible: -allergic reactions like skin rash, itching or hives, swelling of the face, lips, or tongue -fever, chills, or sore throat -muscle tenderness, pain, or weakness -numbness or tingling in hands or feet -unusual bleeding or bruising -unusually weak or tired -vomiting Side effects that usually do not require medical attention (report to your doctor or health care professional if they continue or are bothersome): -diarrhea -hair loss -loss of appetite -stomach pain or nausea This list may not describe all possible side effects. Call your doctor for medical advice about side effects. You may report side effects to FDA at 1-800-FDA-1088. Where should I keep my medicine? Keep out of the reach of children. Store at room temperature between 15 and 30 degrees C (59 and 86 degrees F). Keep container tightly closed. Protect from light. Throw away any unused medicine after the expiration date. NOTE: This sheet is a summary. It may not cover all possible information. If you have questions about this medicine, talk to your doctor, pharmacist, or health care provider.  2018 Elsevier/Gold Standard (2013-06-07 16:48:38)

## 2017-05-02 NOTE — Telephone Encounter (Signed)
Error no phone note/ pt was here in office today

## 2017-05-02 NOTE — Telephone Encounter (Signed)
I received a PA request from the pharmacy for [place] on [address].  I spoke to the pharmacy who reported they tried to fill it for Colcrys because copay for generic colchicine was $182 (likely due to deductible).    I informed patient of this.  She decided she would like to apply for [facility] patient assistance program.  I advised her of what income information is needed.  She will bring information in early next week and complete form.    Patient denies any other questions or concerns at this time.    Elisabeth Most, Pharm.D., BCPS, CPP Clinical Pharmacist Pager: [phone] Phone: [phone] 05/02/2017 10:11 AM

## 2017-05-03 LAB — URINALYSIS, ROUTINE W REFLEX MICROSCOPIC
Bilirubin Urine: NEGATIVE
Glucose, UA: NEGATIVE
HGB URINE DIPSTICK: NEGATIVE
KETONES UR: NEGATIVE
Leukocytes, UA: NEGATIVE
NITRITE: NEGATIVE
PH: 5.5 (ref 5.0–8.0)
PROTEIN: NEGATIVE
Specific Gravity, Urine: 1.016 (ref 1.001–1.035)

## 2017-05-03 LAB — SEDIMENTATION RATE: SED RATE: 5 mm/h (ref 0–30)

## 2017-05-03 LAB — URIC ACID: Uric Acid, Serum: 3.6 mg/dL (ref 2.5–7.0)

## 2017-05-03 LAB — MAGNESIUM: MAGNESIUM: 1.9 mg/dL (ref 1.5–2.5)

## 2017-05-03 LAB — FERRITIN: Ferritin: 27 ng/mL (ref 20–288)

## 2017-05-03 NOTE — Progress Notes (Signed)
WNL

## 2017-05-05 LAB — RHEUMATOID FACTOR

## 2017-05-05 LAB — CYCLIC CITRUL PEPTIDE ANTIBODY, IGG: Cyclic Citrullin Peptide Ab: <16 Units

## 2017-05-05 NOTE — Progress Notes (Signed)
WNL

## 2017-05-08 ENCOUNTER — Telehealth: Payer: Self-pay | Admitting: Pharmacist

## 2017-05-08 NOTE — Telephone Encounter (Signed)
I called patient to follow up on the [facility] patient assistance application.  She reports she is unable to come by the office at this time due to a sick family member.  She will call when she is able to come by the office to complete application.   Elisabeth Most, Pharm.D., BCPS, CPP Clinical Pharmacist Pager: [phone] Phone: [phone] 05/08/2017 11:09 AM

## 2017-05-29 DIAGNOSIS — M25511 Pain in right shoulder: Secondary | ICD-10-CM

## 2017-05-29 DIAGNOSIS — G8929 Other chronic pain: Secondary | ICD-10-CM | POA: Insufficient documentation

## 2017-05-29 DIAGNOSIS — M19041 Primary osteoarthritis, right hand: Secondary | ICD-10-CM | POA: Insufficient documentation

## 2017-05-29 DIAGNOSIS — M19072 Primary osteoarthritis, left ankle and foot: Secondary | ICD-10-CM

## 2017-05-29 DIAGNOSIS — M19071 Primary osteoarthritis, right ankle and foot: Secondary | ICD-10-CM | POA: Insufficient documentation

## 2017-05-29 DIAGNOSIS — M19042 Primary osteoarthritis, left hand: Secondary | ICD-10-CM

## 2017-05-29 NOTE — Progress Notes (Signed)
Office Visit Note  Patient: Jessica Mcneil             Date of Birth: 1941-11-10           MRN: [id number]             PCP: Eulas Post, MD Referring: Eulas Post, MD Visit Date: 06/03/2017 Occupation: @GUAROCC @    Subjective:  Left hand pain.   History of Present Illness: SIMONNE BOULOS is a 76 y.o. female history of gouty arthropathy. She's been on allopurinol. She took colchicine but had to discontinue due to diarrhea. She has not had any flares of gout. She states she continues to have some discomfort in her hands and feet with the weather change. She's been taking some natural anti-inflammatories which is been helpful.  Activities of Daily Living:  Patient reports morning stiffness for 15 minutes.   Patient Denies nocturnal pain.  Difficulty dressing/grooming: Denies Difficulty climbing stairs: Denies Difficulty getting out of chair: Denies Difficulty using hands for taps, buttons, cutlery, and/or writing: Denies   Review of Systems  Constitutional: Negative for fatigue, night sweats, weight gain, weight loss and weakness.  HENT: Negative for mouth sores, trouble swallowing, trouble swallowing, mouth dryness and nose dryness.   Eyes: Negative for pain, redness, visual disturbance and dryness.  Respiratory: Negative for cough, shortness of breath and difficulty breathing.   Cardiovascular: Negative for chest pain, palpitations, hypertension, irregular heartbeat and swelling in legs/feet.  Gastrointestinal: Negative for blood in stool, constipation and diarrhea.  Endocrine: Negative for increased urination.  Genitourinary: Negative for vaginal dryness.  Musculoskeletal: Negative for arthralgias, joint pain, joint swelling, myalgias, muscle weakness, morning stiffness, muscle tenderness and myalgias.  Skin: Negative for color change, rash, hair loss, skin tightness, ulcers and sensitivity to sunlight.  Allergic/Immunologic: Negative for susceptible to  infections.  Neurological: Negative for dizziness, memory loss and night sweats.  Hematological: Negative for swollen glands.  Psychiatric/Behavioral: Negative for depressed mood and sleep disturbance. The patient is not nervous/anxious.     PMFS History:  Patient Active Problem List   Diagnosis Date Noted  . Chronic right shoulder pain 05/29/2017  . Osteoarthritis of hands, bilateral 05/29/2017  . Primary osteoarthritis of both feet 05/29/2017  . Gout 02/13/2016  . Migraine headache with aura 05/08/2015  . Chronic systolic heart failure ([place]) 07/18/2014  . HTN (hypertension) 08/03/2013  . Abnormal mammogram with microcalcification 01/25/2013  . Dyspnea 03/14/2011  . HEADACHE 06/21/2010  . ACUTE CHRONIC COMB SYSTOLIC&DIASTOLIC HEART FAIL 38/46/6599  . History of depression 08/04/2009  . Insomnia 08/04/2009  . CONSTIPATION 06/22/2009  . Hyperlipidemia 02/23/2009  . 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 Arthritis   . CHF (congestive heart failure) ([place])    due to non ischemic cardiomyopathy  . Depression   . GERD (gastroesophageal reflux disease)   . Headache(784.0)    migraines  . HLD (hyperlipidemia)   . HTN (hypertension)   . Overweight(278.02)   . PONV (postoperative nausea and vomiting)    long time ago none recent  . Shortness of breath     Family History  Problem Relation Age of Onset  . Heart disease Father   . Arthritis Unknown   . Hyperlipidemia Unknown    Past Surgical History:  Procedure Laterality Date  . ANTERIOR AND POSTERIOR REPAIR  03  . APPENDECTOMY    . [place]  bilat  . BREAST BIOPSY Right   . BREAST LUMPECTOMY WITH NEEDLE LOCALIZATION Right 02/12/2013   Procedure: BREAST LUMPECTOMY WITH NEEDLE LOCALIZATION;  Surgeon: Merrie Roof, MD;  Location: [facility];  Service: General;  Laterality: Right;  . CHOLECYSTECTOMY     . COLONOSCOPY    . EYE SURGERY     Glaucoma Implant  . TONSILLECTOMY    . VAGINAL HYSTERECTOMY  1998   Social History   Social History Narrative  . No narrative on file     Objective: Vital Signs: BP 117/64 (BP Location: Left Arm, Patient Position: Sitting, Cuff Size: Large)   Pulse 81   Resp 13   Ht 5' 7"  (1.702 m)   Wt 173 lb (78.5 kg)   BMI 27.10 kg/m    Physical Exam  Constitutional: She is oriented to person, place, and time. She appears well-developed and well-nourished.  HENT:  Head: Normocephalic and atraumatic.  Eyes: Conjunctivae and EOM are normal.  Neck: Normal range of motion.  Cardiovascular: Normal rate, regular rhythm, normal heart sounds and intact distal pulses.   Pulmonary/Chest: Effort normal and breath sounds normal.  Abdominal: Soft. Bowel sounds are normal.  Lymphadenopathy:    She has no cervical adenopathy.  Neurological: She is alert and oriented to person, place, and time.  Skin: Skin is warm and dry. Capillary refill takes less than 2 seconds.  Psychiatric: She has a normal mood and affect. Her behavior is normal.  Nursing note and vitals reviewed.    Musculoskeletal Exam: C-spine and thoracic lumbar spine good range of motion. She is some discomfort range of motion of her right shoulder. Shoulder joints elbow joints are good range of motion. She has some thickening of her PIP/DIP joints in her hands but no active synovitis. Hip joints knee joints ankles MTPs PIPs DIPs are good range of motion. Some osteoarthritic changes in her feet is noted.  CDAI Exam: No CDAI exam completed.    Investigation: Findings:   05/02/2017 CCP, Ferritin ,Iron and TIBC, Magnesium ,Rheumatoid factor,Sedimentation rate, Uric acid , and UA are all normal   CBC Latest Ref Rng & Units 05/02/2017 02/20/2016 07/18/2014  WBC 3.8 - 10.8 K/uL 7.4 7.0 10.7(H)  Hemoglobin 11.7 - 15.5 g/dL 14.2 15.2(H) 15.6(H)  Hematocrit 35.0 - 45.0 % 43.7 45.4 44.9  Platelets 140 - 400  K/uL 285 324.0 307   CMP Latest Ref Rng & Units 05/02/2017 04/24/2016 02/20/2016  Glucose 65 - 99 mg/dL 116(H) 106(H) 121(H)  BUN 7 - 25 mg/dL 13 13 12   Creatinine 0.60 - 0.93 mg/dL 0.83 0.83 0.88  Sodium 135 - 146 mmol/L 140 140 140  Potassium 3.5 - 5.3 mmol/L 4.6 4.4 4.9  Chloride 98 - 110 mmol/L 105 102 100  CO2 20 - 31 mmol/L 25 28 31   Calcium 8.6 - 10.4 mg/dL 9.2 8.8 9.4  Total Protein 6.1 - 8.1 g/dL 6.5 6.6 7.1  Total Bilirubin 0.2 - 1.2 mg/dL 0.4 0.4 0.6  Alkaline Phos 33 - 130 U/L 70 75 73  AST 10 - 35 U/L 15 14 17   ALT 6 - 29 U/L 16 14 17    05/02/2017 UA negative, iron studies normal, ESR 5, RF negative, anti-CCP negative, uric acid 3.6, magnesium 1.97 Imaging: No results found.  Speciality Comments: No specialty comments available.    Procedures:  No procedures performed Allergies: Codeine; Erythromycin; Lipitor [atorvastatin]; and Sulfamethoxazole   Assessment / Plan:     Visit Diagnoses: Idiopathic chronic gout  of multiple sites without tophus: She has not had any recent flare in her uric acid isn't desirable range. She'll continue on allopurinol as prescribed. She had side effects from colchicine with diarrhea she will hopefully will not need that I'll give her a sample bottle in case she have to use it in future. We will recheck her labs in about 6 months which will include CBC and CMP and uric acid. Labs were reviewed today follow her autoimmune workup has been negative.  Primary osteoarthritis of both hands: Joint protection and muscle strengthening discussed.:  Primary osteoarthritis of both feet: She's been using proper fitting shoes.  Right shoulder joint discomfort: Patient gives history of rotator cuff tear in the past. She's been seen by Dr. Mardelle Matte and gets injections on when necessary basis.  Primary insomnia: Is doing better  History of depression  History of CHF (congestive heart failure)  History of hypertension  History of migraine     Orders: Orders Placed This Encounter  Procedures  . CBC with Differential/Platelet  . COMPLETE METABOLIC PANEL WITH GFR  . Uric acid   No orders of the defined types were placed in this encounter.   Face-to-face time spent with patient was 30 minutes. 50% of time was spent in counseling and coordination of care.  Follow-Up Instructions: Return in about 6 months (around 12/03/2017) for Gout.   Bo Merino, MD  Note - This record has been created using Editor, commissioning.  Chart creation errors have been sought, but may not always  have been located. Such creation errors do not reflect on  the standard of medical care.

## 2017-06-03 ENCOUNTER — Encounter: Payer: Self-pay | Admitting: Rheumatology

## 2017-06-03 ENCOUNTER — Ambulatory Visit (INDEPENDENT_AMBULATORY_CARE_PROVIDER_SITE_OTHER): Payer: Medicare Other | Admitting: Rheumatology

## 2017-06-03 VITALS — BP 117/64 | HR 81 | Resp 13 | Ht 67.0 in | Wt 173.0 lb

## 2017-06-03 DIAGNOSIS — Z8669 Personal history of other diseases of the nervous system and sense organs: Secondary | ICD-10-CM

## 2017-06-03 DIAGNOSIS — M19041 Primary osteoarthritis, right hand: Secondary | ICD-10-CM | POA: Diagnosis not present

## 2017-06-03 DIAGNOSIS — Z5181 Encounter for therapeutic drug level monitoring: Secondary | ICD-10-CM

## 2017-06-03 DIAGNOSIS — Z8659 Personal history of other mental and behavioral disorders: Secondary | ICD-10-CM | POA: Diagnosis not present

## 2017-06-03 DIAGNOSIS — Z8679 Personal history of other diseases of the circulatory system: Secondary | ICD-10-CM | POA: Diagnosis not present

## 2017-06-03 DIAGNOSIS — F5101 Primary insomnia: Secondary | ICD-10-CM | POA: Diagnosis not present

## 2017-06-03 DIAGNOSIS — M19042 Primary osteoarthritis, left hand: Secondary | ICD-10-CM

## 2017-06-03 DIAGNOSIS — M19072 Primary osteoarthritis, left ankle and foot: Secondary | ICD-10-CM

## 2017-06-03 DIAGNOSIS — M19071 Primary osteoarthritis, right ankle and foot: Secondary | ICD-10-CM | POA: Diagnosis not present

## 2017-06-03 DIAGNOSIS — M1A09X0 Idiopathic chronic gout, multiple sites, without tophus (tophi): Secondary | ICD-10-CM

## 2017-06-03 NOTE — Progress Notes (Signed)
Rheumatology Medication Review by a Pharmacist Does the patient feel that his/her medications are working for him/her?  Yes, patient reports allopurinol is helping to prevent gout flares Has the patient been experiencing any side effects to the medications prescribed?  Yes, patient reports she is unable to take colchicine on a daily basis due to diarrhea.  She reports taking colchicine as needed for gout flares.   Does the patient have any problems obtaining medications?  Yes, colchicine is very expensive.  We discussed patient assistance program; however, patient does not want to apply at this time since she is not able to take colchicine on a daily basis and is only taking as needed.    Issues to address at subsequent visits: None   Pharmacist comments:  Meeyah is a pleasant 76 yo F who presents for follow up of gout.  She is currently taking allopurinol 300 mg by mouth daily and colchicine as needed.  She denies any gout flares.  Most recent uric acid very well controlled.  Will provide patient with a sample of Mitigare 0.6 mg capsules to use daily as needed.  Drug name: Mitigare; Strength: 0.6 mg; Qty: 15 capsules; LOT: [id number]; Exp.Date: Feb 2019; Dosing instructions: Take 1 capsule by mouth daily as needed.  The patient has been instructed regarding the correct time, dose, and frequency of taking this medication, including desired effects and most common side effects.  Patient denies any questions or concerns regarding her medications at this time.   Elisabeth Most, Pharm.D., BCPS, CPP Clinical Pharmacist Pager: [phone] Phone: [phone] 06/03/2017 11:58 AM

## 2017-06-03 NOTE — Patient Instructions (Signed)
Labs are due in November CBC, CMP, uric acid

## 2017-06-04 ENCOUNTER — Encounter: Payer: Self-pay | Admitting: Cardiology

## 2017-06-04 ENCOUNTER — Ambulatory Visit (INDEPENDENT_AMBULATORY_CARE_PROVIDER_SITE_OTHER): Payer: Medicare Other | Admitting: Cardiology

## 2017-06-04 VITALS — BP 118/69 | HR 75 | Ht 67.0 in | Wt 174.0 lb

## 2017-06-04 DIAGNOSIS — I5022 Chronic systolic (congestive) heart failure: Secondary | ICD-10-CM

## 2017-06-04 DIAGNOSIS — E782 Mixed hyperlipidemia: Secondary | ICD-10-CM

## 2017-06-04 DIAGNOSIS — I10 Essential (primary) hypertension: Secondary | ICD-10-CM | POA: Diagnosis not present

## 2017-06-04 DIAGNOSIS — I428 Other cardiomyopathies: Secondary | ICD-10-CM | POA: Diagnosis not present

## 2017-06-04 NOTE — Progress Notes (Signed)
Patient ID: Jessica Mcneil, female   DOB: 05-Mar-1941, 76 y.o.   MRN: [id number]    CARDIOLOGY CLINIC NOTE  Patient ID: Jessica Mcneil, female   DOB: [birth year], 76 y.o.   MRN: [id number]  HPI: Jessica Mcneil is a delightful 76 year old woman with a history of a nonischemic cardiomyopathy, hyperlipidemia. She also has a hx of a R lumpectomy for benign dz 2/14.  1. 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   February 19, 2006 cardiac catheterization revealing normal            coronary arteries with an EF of 50%. EF 35-40% by ECHO.      b.   Echo 2008: EF 40-50% with mild diffuse            LV hypokinesis and abnormal left ventricular relaxation.  Mild AI.      c.   Echo 8/09 EF 55-60%. Trivial AI     d.   CPX 4/07, pVO2: 20.6 (105% predicted) slope of 42.7.  pRER 1.09.            There was a mild restriction on her spirometry.     e. Echo 4/12 EF 60-65% grade 1 DD. Mild MR/AI      f. Echo 7/15 EF 45-50% global HK. Grade 1 DD. Mild RV dysfx. Mild AI/TR     g. Echo 9/16 EF 50-55% (read as 45-50%) Grade 1 DD. Normal RV Mild AI     H. Echo 12/17 EF 45-50% (looks like 50%), Mild AI, Normal RV  She transitioned her care from Dr Haroldine Laws, she was last seen in March 2017, pravastatin was added to her regimen as her LDL was 180 with improvement to 120, we tried to increase to 20 mg a day however she couldn't tolerated because of significant muscle pain and decreased back to 10 mg daily. She has been dealing with gout and increase her allopurinol from 100-202 300 mg by mouth twice a day in the last months she had 2 episodes of gout. T  01/31/2017 - patient is coming after 2 months, at the last visit spironolactone 12.5 mg was added to her regimen and she is tolerating it she denies any chest pain, shortness of breath, she uses hydrochlorothiazide for edema as needed and she didn't need to use it in the last 2 months. She denies any orthopnea or proximal nocturnal dyspnea. Since I saw her last  time she felt device both of those were mechanical falls but on one occasion she hit the back of her head with significant bruising but didn't go to the ER.  06/04/2017 - the patient is coming after 4 months, she has been doing great, she denies any chest pain, she has dyspnea on moderate exertion that has been unchanged. She denies any lower extremity edema no orthopnea or paroxysmal nocturnal dyspnea. She denies any palpitations, no claudications dizziness or syncope. She has been compliant to medicine and is able to tolerate half dose of pravastatin.  ROS: All systems negative except as listed in HPI, PMH and Problem List.  Past Medical History:  Diagnosis Date  . Arthritis   . CHF (congestive heart failure) ([place])    due to non ischemic cardiomyopathy  . Depression   . GERD (gastroesophageal reflux disease)   . Headache(784.0)    migraines  . HLD (hyperlipidemia)   . HTN (hypertension)   . Overweight(278.02)   . PONV (postoperative nausea and vomiting)    long  time ago none recent  . Shortness of breath     Current Outpatient Prescriptions  Medication Sig Dispense Refill  . allopurinol (ZYLOPRIM) 300 MG tablet Take 1 tablet (300 mg total) by mouth daily. 90 tablet 2  . ALPRAZolam (XANAX) 0.5 MG tablet Take 1 tablet (0.5 mg total) by mouth at bedtime as needed. For sleep/anxiety 90 tablet 0  . aspirin EC 81 MG tablet Take 81 mg by mouth daily.    [name] CALCIUM-MAGNESIUM-ZINC PO Take 1 tablet by mouth daily.    . colchicine 0.6 MG tablet Take 1 tablet (0.6 mg total) by mouth daily. 30 tablet 5  . docusate sodium (COLACE) 100 MG capsule Take 200 mg by mouth at bedtime.    [name] FLUoxetine (PROZAC) 20 MG capsule Take 2 capsules by mouth once daily. 180 capsule 2  . GARLIC PO Take 1 tablet by mouth 2 (two) times daily.    . hydrochlorothiazide (MICROZIDE) 12.5 MG capsule Take 1 capsule (12.5 mg total) by mouth daily as needed (Swelling). 15 capsule 3  . HYDROcodone-acetaminophen  (NORCO/VICODIN) 5-325 MG per tablet Take 1 tablet by mouth every 6 (six) hours as needed. For migraines 30 tablet 0  . metoprolol succinate (TOPROL-XL) 50 MG 24 hr tablet TAKE 1 TABLET BY MOUTH ONCE DAILY WITH OR IMMEDIATELY FOLLOWING A MEAL 90 tablet 3  . Misc Natural Products (TART CHERRY ADVANCED) CAPS Take 1 capsule by mouth daily. 1200 mg Daily    . Multiple Vitamin (MULTIVITAMIN WITH MINERALS) TABS Take 1 tablet by mouth daily.    . Omega-3 Fatty Acids 1200 MG CAPS Take 1,200 mg by mouth daily.    [name] omeprazole (PRILOSEC) 20 MG capsule TAKE 1 CAPSULE BY MOUTH  DAILY 90 capsule 1  . pravastatin (PRAVACHOL) 20 MG tablet Take 1 tablet (20 mg total) by mouth every evening. 90 tablet 2  . spironolactone (ALDACTONE) 25 MG tablet TAKE 1/2 TABLET ONCE A DAY 45 tablet 3   No current facility-administered medications for this visit.    Vitals:   06/04/17 0906  BP: 118/69  Pulse: 75  SpO2: 93%  Weight: 174 lb (78.9 kg)  Height: 5\' 7"  (1.702 m)    PHYSICAL EXAM: General:  Well appearing. No resp difficulty HEENT: normal Neck: supple. JVP 5-6 Carotids 2+ bilaterally; no bruits. No lymphadenopathy or thryomegaly appreciated. Cor: PMI normal. Regular rate & rhythm. No rubs, murmurs.  Lungs: clear Abdomen: obese soft, nontender, nondistended. No hepatosplenomegaly. No bruits or masses. Good bowel sounds. Extremities: no cyanosis, clubbing, rash, edema Neuro: alert & orientedx3, cranial nerves grossly intact. Moves all 4 extremities w/o difficulty. Affect pleasant.  EKG on 10/31/2016 shows normal sinus rhythm nonspecific T wave abnormalities that are unchanged from 03/12/2016    ASSESSMENT & PLAN:  1) Acute on chronic systolic HF: Nonischemic cardiomyopathy. With prior history of low EF the most recent echo in 12/17 showed LVEF 50%, we added spironolactone 12.5 mg by mouth daily. She wasn't able to tolerate low-dose ACE-I due to hypotension - Continue Toprol XL 50 mg daily. Continue  spironolactone 12.5 mg daily. - She is euvolemic, functional class NYHA IIa - HCTZ only as needed, we need to use it, advised to use it sparsely because of history of gout.   2) Essential hypertension -Blood pressure well controlled. Continue current regimen. No orthostatic hypotension.  3) gout - Patient is told to continue same dose of Allopurinol, she was prescribed Colchicine by rheumatologist,  She developed significant diarrhea, for now she will  only use it for flareups.  4) HL - Cholesterol improved with pravastatin 10 mg po daily, decreased from 180-120-137, she was not able to tolerate higher dose of pravastatin.  Her labs will be repeated by her primary care physician shortly. LFTs were normal in May 2018. - Diet and weight loss important.   Follow up in 6 months.  Jermine Bibbee,MD 9:21 AM

## 2017-06-04 NOTE — Patient Instructions (Signed)
Medication Instructions:   Your physician recommends that you continue on your current medications as directed. Please refer to the Current Medication list given to you today.    Follow-Up:  Your physician wants you to follow-up in: 6 MONTH WITH DR NELSON You will receive a reminder letter in the mail two months in advance. If you don't receive a letter, please call our office to schedule the follow-up appointment.        If you need a refill on your cardiac medications before your next appointment, please call your pharmacy.   

## 2017-06-18 DIAGNOSIS — M7541 Impingement syndrome of right shoulder: Secondary | ICD-10-CM | POA: Diagnosis not present

## 2017-06-18 DIAGNOSIS — M79644 Pain in right finger(s): Secondary | ICD-10-CM | POA: Diagnosis not present

## 2017-07-07 ENCOUNTER — Other Ambulatory Visit: Payer: Self-pay | Admitting: Family Medicine

## 2017-07-16 DIAGNOSIS — M7541 Impingement syndrome of right shoulder: Secondary | ICD-10-CM | POA: Diagnosis not present

## 2017-07-17 DIAGNOSIS — H401423 Capsular glaucoma with pseudoexfoliation of lens, left eye, severe stage: Secondary | ICD-10-CM | POA: Diagnosis not present

## 2017-07-22 DIAGNOSIS — M25511 Pain in right shoulder: Secondary | ICD-10-CM | POA: Diagnosis not present

## 2017-07-28 DIAGNOSIS — M75111 Incomplete rotator cuff tear or rupture of right shoulder, not specified as traumatic: Secondary | ICD-10-CM | POA: Diagnosis not present

## 2017-08-05 ENCOUNTER — Telehealth: Payer: Self-pay | Admitting: *Deleted

## 2017-08-05 NOTE — Telephone Encounter (Signed)
-----   Message from Theodoro Parma, RN sent at 07/31/2017  5:41 PM EDT ----- This was faxed, just wanted to send in case it's needed again :) ----- Message ----- From: Dorothy Spark, MD Sent: 07/31/2017  11:26 AM To: Theodoro Parma, RN  To Dr Marchia Bond,   Ms. Jessica Mcneil is under my care and there is currently no contraindication form cardiac standpoint for her to undergo scheduled right shoulder surgery. She should continue metoprolol in a perioperative period.  Please cause with any questions.  Sincerely,  Ena Dawley, MD 07/31/2017

## 2017-08-21 DIAGNOSIS — M66821 Spontaneous rupture of other tendons, right upper arm: Secondary | ICD-10-CM | POA: Diagnosis not present

## 2017-08-21 DIAGNOSIS — M94211 Chondromalacia, right shoulder: Secondary | ICD-10-CM | POA: Diagnosis not present

## 2017-08-21 DIAGNOSIS — S46011A Strain of muscle(s) and tendon(s) of the rotator cuff of right shoulder, initial encounter: Secondary | ICD-10-CM | POA: Diagnosis not present

## 2017-08-21 DIAGNOSIS — M75121 Complete rotator cuff tear or rupture of right shoulder, not specified as traumatic: Secondary | ICD-10-CM | POA: Diagnosis not present

## 2017-08-21 DIAGNOSIS — M24111 Other articular cartilage disorders, right shoulder: Secondary | ICD-10-CM | POA: Diagnosis not present

## 2017-08-21 DIAGNOSIS — G8918 Other acute postprocedural pain: Secondary | ICD-10-CM | POA: Diagnosis not present

## 2017-09-05 DIAGNOSIS — M24111 Other articular cartilage disorders, right shoulder: Secondary | ICD-10-CM | POA: Diagnosis not present

## 2017-09-11 ENCOUNTER — Encounter: Payer: Self-pay | Admitting: Family Medicine

## 2017-09-23 ENCOUNTER — Ambulatory Visit: Payer: Medicare Other

## 2017-10-06 DIAGNOSIS — S46011D Strain of muscle(s) and tendon(s) of the rotator cuff of right shoulder, subsequent encounter: Secondary | ICD-10-CM | POA: Diagnosis not present

## 2017-10-28 ENCOUNTER — Other Ambulatory Visit: Payer: Self-pay | Admitting: Cardiology

## 2017-10-28 ENCOUNTER — Other Ambulatory Visit: Payer: Self-pay | Admitting: Family Medicine

## 2017-10-28 DIAGNOSIS — I5043 Acute on chronic combined systolic (congestive) and diastolic (congestive) heart failure: Secondary | ICD-10-CM

## 2017-10-28 DIAGNOSIS — E785 Hyperlipidemia, unspecified: Secondary | ICD-10-CM

## 2017-10-28 DIAGNOSIS — I10 Essential (primary) hypertension: Secondary | ICD-10-CM

## 2017-10-28 NOTE — Telephone Encounter (Signed)
Rx sent pt is due for an OV.

## 2017-11-03 DIAGNOSIS — S46011D Strain of muscle(s) and tendon(s) of the rotator cuff of right shoulder, subsequent encounter: Secondary | ICD-10-CM | POA: Diagnosis not present

## 2017-11-03 DIAGNOSIS — M75111 Incomplete rotator cuff tear or rupture of right shoulder, not specified as traumatic: Secondary | ICD-10-CM | POA: Diagnosis not present

## 2017-11-05 DIAGNOSIS — M25511 Pain in right shoulder: Secondary | ICD-10-CM | POA: Diagnosis not present

## 2017-11-10 DIAGNOSIS — M75111 Incomplete rotator cuff tear or rupture of right shoulder, not specified as traumatic: Secondary | ICD-10-CM | POA: Diagnosis not present

## 2017-11-10 DIAGNOSIS — M25511 Pain in right shoulder: Secondary | ICD-10-CM | POA: Diagnosis not present

## 2017-11-17 DIAGNOSIS — H401123 Primary open-angle glaucoma, left eye, severe stage: Secondary | ICD-10-CM | POA: Diagnosis not present

## 2017-11-22 NOTE — Progress Notes (Deleted)
Office Visit Note  Patient: Jessica Mcneil             Date of Birth: 03/15/1941           MRN: [id number]             PCP: Eulas Post, MD Referring: Eulas Post, MD Visit Date: 12/03/2017 Occupation: @GUAROCC @    Subjective:  No chief complaint on file.   History of Present Illness: Jessica Mcneil is a 76 y.o. female ***   Activities of Daily Living:  Patient reports morning stiffness for *** {minute/hour:19697}.   Patient {ACTIONS;DENIES/REPORTS:21021675::"Denies"} nocturnal pain.  Difficulty dressing/grooming: {ACTIONS;DENIES/REPORTS:21021675::"Denies"} Difficulty climbing stairs: {ACTIONS;DENIES/REPORTS:21021675::"Denies"} Difficulty getting out of chair: {ACTIONS;DENIES/REPORTS:21021675::"Denies"} Difficulty using hands for taps, buttons, cutlery, and/or writing: {ACTIONS;DENIES/REPORTS:21021675::"Denies"}   No Rheumatology ROS completed.   PMFS History:  Patient Active Problem List   Diagnosis Date Noted  . Chronic right shoulder pain 05/29/2017  . Osteoarthritis of hands, bilateral 05/29/2017  . Primary osteoarthritis of both feet 05/29/2017  . Gout 02/13/2016  . Migraine headache with aura 05/08/2015  . Chronic systolic heart failure ([place]) 07/18/2014  . HTN (hypertension) 08/03/2013  . Abnormal mammogram with microcalcification 01/25/2013  . Dyspnea 03/14/2011  . HEADACHE 06/21/2010  . ACUTE CHRONIC COMB SYSTOLIC&DIASTOLIC HEART FAIL 38/25/0539  . History of depression 08/04/2009  . Insomnia 08/04/2009  . CONSTIPATION 06/22/2009  . Hyperlipidemia 02/23/2009  . 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 Arthritis   . CHF (congestive heart failure) ([place])    due to non ischemic cardiomyopathy  . Depression   . GERD (gastroesophageal reflux disease)   . Headache(784.0)    migraines  . HLD (hyperlipidemia)   . HTN (hypertension)   .  Overweight(278.02)   . PONV (postoperative nausea and vomiting)    long time ago none recent  . Shortness of breath     Family History  Problem Relation Age of Onset  . Heart disease Father   . Arthritis Unknown   . Hyperlipidemia Unknown    Past Surgical History:  Procedure Laterality Date  . ANTERIOR AND POSTERIOR REPAIR  03  . APPENDECTOMY    . BELPHAROPTOSIS REPAIR     bilat  . BREAST BIOPSY Right   . BREAST LUMPECTOMY WITH NEEDLE LOCALIZATION Right 02/12/2013   Procedure: BREAST LUMPECTOMY WITH NEEDLE LOCALIZATION;  Surgeon: Merrie Roof, MD;  Location: [facility];  Service: General;  Laterality: Right;  . CHOLECYSTECTOMY    . COLONOSCOPY    . EYE SURGERY     Glaucoma Implant  . TONSILLECTOMY    . VAGINAL HYSTERECTOMY  1998   Social History   Social History Narrative  . Not on file     Objective: Vital Signs: There were no vitals taken for this visit.   Physical Exam   Musculoskeletal Exam: ***  CDAI Exam: No CDAI exam completed.    Investigation: No additional findings. Uric acid: 05/02/2017 3.6 CBC Latest Ref Rng & Units 05/02/2017 02/20/2016 07/18/2014  WBC 3.8 - 10.8 K/uL 7.4 7.0 10.7(H)  Hemoglobin 11.7 - 15.5 g/dL 14.2 15.2(H) 15.6(H)  Hematocrit 35.0 - 45.0 % 43.7 45.4 44.9  Platelets 140 - 400 K/uL 285 324.0 307   CMP Latest Ref Rng & Units 05/02/2017 04/24/2016 02/20/2016  Glucose 65 - 99 mg/dL 116(H) 106(H) 121(H)  BUN 7 - 25 mg/dL 13 13 12   Creatinine 0.60 -  0.93 mg/dL 0.83 0.83 0.88  Sodium 135 - 146 mmol/L 140 140 140  Potassium 3.5 - 5.3 mmol/L 4.6 4.4 4.9  Chloride 98 - 110 mmol/L 105 102 100  CO2 20 - 31 mmol/L 25 28 31   Calcium 8.6 - 10.4 mg/dL 9.2 8.8 9.4  Total Protein 6.1 - 8.1 g/dL 6.5 6.6 7.1  Total Bilirubin 0.2 - 1.2 mg/dL 0.4 0.4 0.6  Alkaline Phos 33 - 130 U/L 70 75 73  AST 10 - 35 U/L 15 14 17   ALT 6 - 29 U/L 16 14 17    Imaging: No results found.  Speciality Comments: No specialty comments  available.    Procedures:  No procedures performed Allergies: Codeine; Erythromycin; Lipitor [atorvastatin]; and Sulfamethoxazole   Assessment / Plan:     Visit Diagnoses: No diagnosis found.    Orders: No orders of the defined types were placed in this encounter.  No orders of the defined types were placed in this encounter.   Face-to-face time spent with patient was *** minutes. 50% of time was spent in counseling and coordination of care.  Follow-Up Instructions: No Follow-up on file.   Earnestine Mealing, CMA  Note - This record has been created using Editor, commissioning.  Chart creation errors have been sought, but may not always  have been located. Such creation errors do not reflect on  the standard of medical care.

## 2017-11-26 ENCOUNTER — Other Ambulatory Visit: Payer: Self-pay | Admitting: Orthopedic Surgery

## 2017-11-28 NOTE — Pre-Procedure Instructions (Signed)
Jessica Mcneil [facility]  11/28/2017      [id number] - [place],  - [address] AT [address] [place] [address] Phone: [phone] Fax: [phone]  [place], [address] [place] [address] Phone: [phone] Fax: [phone]    Your procedure is scheduled on December 18  Report to [place] at [facility].  Call this number if you have problems the morning of surgery:  [phone]   Remember:  Do not eat food or drink liquids after midnight.  Continue all medications as directed by your physician except follow these medication instructions before surgery below   Take these medicines the morning of surgery with A SIP OF WATER  allopurinol (ZYLOPRIM) FLUoxetine (PROZAC) HYDROcodone-acetaminophen (NORCO/VICODIN)  metoprolol succinate (TOPROL-XL) omeprazole (PRILOSEC)  7 days prior to surgery STOP taking any Aspirin(unless otherwise instructed by your surgeon), Aleve, Naproxen, Ibuprofen, Motrin, Advil, Goody's, BC's, all herbal medications, fish oil, and all vitamins  Follow your doctors instructions regarding your Aspirin.  If no instructions were given by your doctor, then you will need to call the prescribing office office to get instructions.      Do not wear jewelry, make-up or nail polish.  Do not wear lotions, powders, or perfumes, or deoderant.  Do not shave 48 hours prior to surgery.  Men may shave face and neck.  Do not bring valuables to the hospital.  [facility] is not responsible for any belongings or valuables.  Contacts, dentures or bridgework may not be worn into surgery.  Leave your suitcase in the car.  After surgery it may be brought to your room.  For patients admitted to the hospital, discharge time will be determined by your treatment team.  Patients discharged the day of surgery will not be  allowed to drive home.    Special instructions:   [place]- Preparing For Surgery  Before surgery, you can play an important role. Because skin is not sterile, your skin needs to be as free of germs as possible. You can reduce the number of germs on your skin by washing with CHG (chlorahexidine gluconate) Soap before surgery.  CHG is an antiseptic cleaner which kills germs and bonds with the skin to continue killing germs even after washing.  Please do not use if you have an allergy to CHG or antibacterial soaps. If your skin becomes reddened/irritated stop using the CHG.  Do not shave (including legs and underarms) for at least 48 hours prior to first CHG shower. It is OK to shave your face.  Please follow these instructions carefully.   1. Shower the NIGHT BEFORE SURGERY and the MORNING OF SURGERY with CHG.   2. If you chose to wash your hair, wash your hair first as usual with your normal shampoo.  3. After you shampoo, rinse your hair and body thoroughly to remove the shampoo.  4. Use CHG as you would any other liquid soap. You can apply CHG directly to the skin and wash gently with a scrungie or a clean washcloth.   5. Apply the CHG Soap to your body ONLY FROM THE NECK DOWN.  Do not use on open wounds or open sores. Avoid contact with your eyes, ears, mouth and genitals (private parts). Wash Face and genitals (private parts)  with your normal soap.  6. Wash thoroughly, paying special attention to the area where  your surgery will be performed.  7. Thoroughly rinse your body with warm water from the neck down.  8. DO NOT shower/wash with your normal soap after using and rinsing off the CHG Soap.  9. Pat yourself dry with a CLEAN TOWEL.  10. Wear CLEAN PAJAMAS to bed the night before surgery, wear comfortable clothes the morning of surgery  11. Place CLEAN SHEETS on your bed the night of your first shower and DO NOT SLEEP WITH PETS.    Day of Surgery: Do not apply any  deodorants/lotions. Please wear clean clothes to the hospital/surgery center.      Please read over the following fact sheets that you were given.

## 2017-12-01 ENCOUNTER — Inpatient Hospital Stay (HOSPITAL_COMMUNITY)
Admission: RE | Admit: 2017-12-01 | Discharge: 2017-12-01 | Disposition: A | Discharge status: Home / Self Care | Payer: Medicare Other | Source: Ambulatory Visit

## 2017-12-03 ENCOUNTER — Ambulatory Visit: Payer: Medicare Other | Admitting: Rheumatology

## 2017-12-04 ENCOUNTER — Other Ambulatory Visit (HOSPITAL_COMMUNITY): Payer: Medicare Other

## 2017-12-04 ENCOUNTER — Encounter (HOSPITAL_COMMUNITY)
Admission: RE | Admit: 2017-12-04 | Discharge: 2017-12-04 | Disposition: A | Discharge status: Home / Self Care | Payer: Medicare Other | Source: Ambulatory Visit | Attending: Orthopedic Surgery | Admitting: Orthopedic Surgery

## 2017-12-04 ENCOUNTER — Other Ambulatory Visit: Payer: Self-pay

## 2017-12-04 ENCOUNTER — Encounter (HOSPITAL_COMMUNITY): Payer: Self-pay

## 2017-12-04 DIAGNOSIS — G8929 Other chronic pain: Secondary | ICD-10-CM | POA: Insufficient documentation

## 2017-12-04 DIAGNOSIS — I4510 Unspecified right bundle-branch block: Secondary | ICD-10-CM | POA: Diagnosis not present

## 2017-12-04 DIAGNOSIS — M25511 Pain in right shoulder: Secondary | ICD-10-CM | POA: Insufficient documentation

## 2017-12-04 DIAGNOSIS — Z01818 Encounter for other preprocedural examination: Secondary | ICD-10-CM | POA: Diagnosis not present

## 2017-12-04 HISTORY — DX: Anxiety disorder, unspecified: F41.9

## 2017-12-04 HISTORY — DX: Pneumonia, unspecified organism: J18.9

## 2017-12-04 LAB — CBC
HCT: 43.2 % (ref 36.0–46.0)
HEMOGLOBIN: 14.7 g/dL (ref 12.0–15.0)
MCH: 30.1 pg (ref 26.0–34.0)
MCHC: 34 g/dL (ref 30.0–36.0)
MCV: 88.3 fL (ref 78.0–100.0)
PLATELETS: 298 10*3/uL (ref 150–400)
RBC: 4.89 MIL/uL (ref 3.87–5.11)
RDW: 14.6 % (ref 11.5–15.5)
WBC: 7.8 10*3/uL (ref 4.0–10.5)

## 2017-12-04 LAB — SURGICAL PCR SCREEN
MRSA, PCR: NEGATIVE
Staphylococcus aureus: NEGATIVE

## 2017-12-04 LAB — BASIC METABOLIC PANEL
ANION GAP: 10 (ref 5–15)
BUN: 7 mg/dL (ref 6–20)
CALCIUM: 9 mg/dL (ref 8.9–10.3)
CHLORIDE: 102 mmol/L (ref 101–111)
CO2: 24 mmol/L (ref 22–32)
Creatinine, Ser: 0.83 mg/dL (ref 0.44–1.00)
GFR calc non Af Amer: >60 mL/min (ref >60)
Glucose, Bld: 110 mg/dL — ABNORMAL HIGH (ref 65–99)
Potassium: 4.2 mmol/L (ref 3.5–5.1)
SODIUM: 136 mmol/L (ref 135–145)

## 2017-12-04 NOTE — Progress Notes (Signed)
PCP is Carolann Littler  Cardiologist is Dr. Meda Coffee- has appointment Monday 12-08-17 States she had difficult  Breathing after her block on Aug 21, 2017 and was told to let [facility] know.  She had to have O2 applied but was find after that, and she did not stay over night.  States she had a card cath and stress test many years ago over 10 years ago. Echo noted 2017

## 2017-12-05 NOTE — Progress Notes (Addendum)
Anesthesia Chart Review:  Pt is a 76 year old female scheduled for R total shoulder arthoplasty on 12/09/2017 with Marchia Bond, MD  - PCP is Carolann Littler, MD - Cardiologist is Ena Dawley, MD. Last office visit 06/04/17.    PMH includes:  Nonischemic cardiomyopathy, CHF, HTN, hyperlipidemia, post-op N/V, GERD. Former smoker. BMI 26.  S/p lumpectomy 02/12/13 for benign disease.   Anesthesia history includes: pt reports she had difficulty breathing and had to have O2 applied after regional block for R shoulder arthroscopy 08/21/17 at [place].  Anesthesia records obtained; pt had GA with LMA and regional anesthesia. There is no mention of this complication in either anesthesia record or PACU notes.   Medications include: ASA 81mg , hctz, metoprolol, prilosec, pravastatin, spironolactone.   BP 125/73   Pulse 83   Temp 36.9 C   Resp 20   Ht 5\' 7"  (1.702 m)   Wt 168 lb 1.6 oz (76.2 kg)   SpO2 98%   BMI 26.33 kg/m   Preoperative labs reviewed.    EKG 12/04/17: Sinus rhythm. Incomplete RBBB.   Echo 11/25/16:  - Left ventricle: The cavity size was normal. Wall thickness was normal. Systolic function was mildly reduced. The estimated ejection fraction was in the range of 45% to 50%. Diffuse hypokinesis. Doppler parameters are consistent with abnormal left ventricular relaxation (grade 1 diastolic dysfunction). GLS:-18% - Aortic valve: Trileaflet; mildly thickened, mildly calcified leaflets. There was mild regurgitation. - Impressions: Compared to the prior study, there has been no significant interval change.  Cardiac cath 02/19/06:  1.  Non-ischemic cardiomyopathy. 2.  Normal left dominant coronaries. 3.  Normal right heart pressures. 4.  Low normal left ventricular ejection fraction  If no changes, I anticipate pt can proceed with surgery as scheduled.   Jessica Cass, FNP-BC [facility] Stay [facility]/Anesthesiology Phone: [phone] 12/08/2017  4:20 PM

## 2017-12-08 ENCOUNTER — Ambulatory Visit: Payer: Medicare Other | Admitting: Cardiology

## 2017-12-08 MED ORDER — CEFAZOLIN SODIUM-DEXTROSE 2-4 GM/100ML-% IV SOLN
2.0000 g | INTRAVENOUS | Status: AC
  Administered 2017-12-09: 2 g via INTRAVENOUS
  Filled 2017-12-08: qty 100

## 2017-12-09 ENCOUNTER — Inpatient Hospital Stay (HOSPITAL_COMMUNITY)
Admission: RE | Admit: 2017-12-09 | Discharge: 2017-12-10 | DRG: 483 | Disposition: A | Discharge status: Home / Self Care | Payer: Medicare Other | Source: Ambulatory Visit | Attending: Orthopedic Surgery | Admitting: Orthopedic Surgery

## 2017-12-09 ENCOUNTER — Encounter (HOSPITAL_COMMUNITY)
Admission: RE | Disposition: A | Discharge status: Home / Self Care | Payer: Self-pay | Source: Ambulatory Visit | Attending: Orthopedic Surgery

## 2017-12-09 ENCOUNTER — Inpatient Hospital Stay (HOSPITAL_COMMUNITY): Payer: Medicare Other | Admitting: Certified Registered Nurse Anesthetist

## 2017-12-09 ENCOUNTER — Inpatient Hospital Stay (HOSPITAL_COMMUNITY): Payer: Medicare Other

## 2017-12-09 ENCOUNTER — Inpatient Hospital Stay (HOSPITAL_COMMUNITY): Payer: Medicare Other | Admitting: Vascular Surgery

## 2017-12-09 ENCOUNTER — Encounter (HOSPITAL_COMMUNITY): Payer: Self-pay | Admitting: Certified Registered Nurse Anesthetist

## 2017-12-09 DIAGNOSIS — Z87891 Personal history of nicotine dependence: Secondary | ICD-10-CM | POA: Diagnosis not present

## 2017-12-09 DIAGNOSIS — M19011 Primary osteoarthritis, right shoulder: Secondary | ICD-10-CM | POA: Diagnosis not present

## 2017-12-09 DIAGNOSIS — K219 Gastro-esophageal reflux disease without esophagitis: Secondary | ICD-10-CM | POA: Diagnosis not present

## 2017-12-09 DIAGNOSIS — Z7982 Long term (current) use of aspirin: Secondary | ICD-10-CM | POA: Diagnosis not present

## 2017-12-09 DIAGNOSIS — F419 Anxiety disorder, unspecified: Secondary | ICD-10-CM | POA: Diagnosis not present

## 2017-12-09 DIAGNOSIS — T8484XA Pain due to internal orthopedic prosthetic devices, implants and grafts, initial encounter: Principal | ICD-10-CM | POA: Diagnosis present

## 2017-12-09 DIAGNOSIS — Y838 Other surgical procedures as the cause of abnormal reaction of the patient, or of later complication, without mention of misadventure at the time of the procedure: Secondary | ICD-10-CM | POA: Diagnosis present

## 2017-12-09 DIAGNOSIS — Z96611 Presence of right artificial shoulder joint: Secondary | ICD-10-CM | POA: Diagnosis not present

## 2017-12-09 DIAGNOSIS — Z79899 Other long term (current) drug therapy: Secondary | ICD-10-CM

## 2017-12-09 DIAGNOSIS — I428 Other cardiomyopathies: Secondary | ICD-10-CM | POA: Diagnosis not present

## 2017-12-09 DIAGNOSIS — K5900 Constipation, unspecified: Secondary | ICD-10-CM | POA: Diagnosis not present

## 2017-12-09 DIAGNOSIS — Z96619 Presence of unspecified artificial shoulder joint: Secondary | ICD-10-CM

## 2017-12-09 DIAGNOSIS — F329 Major depressive disorder, single episode, unspecified: Secondary | ICD-10-CM | POA: Diagnosis present

## 2017-12-09 DIAGNOSIS — I509 Heart failure, unspecified: Secondary | ICD-10-CM | POA: Diagnosis present

## 2017-12-09 DIAGNOSIS — M75101 Unspecified rotator cuff tear or rupture of right shoulder, not specified as traumatic: Secondary | ICD-10-CM | POA: Diagnosis present

## 2017-12-09 DIAGNOSIS — T84098A Other mechanical complication of other internal joint prosthesis, initial encounter: Secondary | ICD-10-CM | POA: Diagnosis not present

## 2017-12-09 DIAGNOSIS — I110 Hypertensive heart disease with heart failure: Secondary | ICD-10-CM | POA: Diagnosis not present

## 2017-12-09 DIAGNOSIS — M19019 Primary osteoarthritis, unspecified shoulder: Secondary | ICD-10-CM | POA: Diagnosis present

## 2017-12-09 DIAGNOSIS — Z471 Aftercare following joint replacement surgery: Secondary | ICD-10-CM | POA: Diagnosis not present

## 2017-12-09 DIAGNOSIS — M25511 Pain in right shoulder: Secondary | ICD-10-CM | POA: Diagnosis present

## 2017-12-09 DIAGNOSIS — G8918 Other acute postprocedural pain: Secondary | ICD-10-CM | POA: Diagnosis not present

## 2017-12-09 DIAGNOSIS — M12811 Other specific arthropathies, not elsewhere classified, right shoulder: Secondary | ICD-10-CM | POA: Diagnosis present

## 2017-12-09 DIAGNOSIS — E785 Hyperlipidemia, unspecified: Secondary | ICD-10-CM | POA: Diagnosis present

## 2017-12-09 HISTORY — PX: TOTAL SHOULDER ARTHROPLASTY: SHX126

## 2017-12-09 HISTORY — DX: Other specific arthropathies, not elsewhere classified, right shoulder: M12.811

## 2017-12-09 HISTORY — DX: Unspecified rotator cuff tear or rupture of right shoulder, not specified as traumatic: M75.101

## 2017-12-09 LAB — SEDIMENTATION RATE: Sed Rate: 1 mm/hr (ref 0–22)

## 2017-12-09 LAB — C-REACTIVE PROTEIN: CRP: <0.8 mg/dL (ref <1.0)

## 2017-12-09 SURGERY — ARTHROPLASTY, SHOULDER, TOTAL
Anesthesia: General | Site: Shoulder | Laterality: Right

## 2017-12-09 MED ORDER — HYDROMORPHONE HCL 1 MG/ML IJ SOLN: 0.2500 mg | INTRAMUSCULAR | Status: DC | PRN

## 2017-12-09 MED ORDER — PROMETHAZINE HCL 25 MG/ML IJ SOLN: 6.2500 mg | INTRAMUSCULAR | Status: DC | PRN

## 2017-12-09 MED ORDER — METHOCARBAMOL 1000 MG/10ML IJ SOLN
500.0000 mg | Freq: Four times a day (QID) | INTRAVENOUS | Status: DC | PRN
  Filled 2017-12-09: qty 5

## 2017-12-09 MED ORDER — PHENYLEPHRINE HCL 10 MG/ML IJ SOLN
INTRAVENOUS | Status: DC | PRN
  Administered 2017-12-09: 25 ug/min via INTRAVENOUS

## 2017-12-09 MED ORDER — DOCUSATE SODIUM 100 MG PO CAPS
100.0000 mg | ORAL_CAPSULE | Freq: Two times a day (BID) | ORAL | Status: DC
  Administered 2017-12-09 – 2017-12-10 (×3): 100 mg via ORAL
  Filled 2017-12-09 (×3): qty 1

## 2017-12-09 MED ORDER — ALUM & MAG HYDROXIDE-SIMETH 200-200-20 MG/5ML PO SUSP: 30.0000 mL | ORAL | Status: DC | PRN

## 2017-12-09 MED ORDER — DOCUSATE SODIUM 100 MG PO CAPS: 200.0000 mg | ORAL_CAPSULE | Freq: Every day | ORAL | Status: DC

## 2017-12-09 MED ORDER — SODIUM CHLORIDE 0.9 % IR SOLN
Status: DC | PRN
  Administered 2017-12-09: 1000 mL

## 2017-12-09 MED ORDER — DEXAMETHASONE SODIUM PHOSPHATE 10 MG/ML IJ SOLN
INTRAMUSCULAR | Status: DC | PRN
  Administered 2017-12-09: 10 mg via INTRAVENOUS

## 2017-12-09 MED ORDER — ONDANSETRON HCL 4 MG/2ML IJ SOLN
INTRAMUSCULAR | Status: DC | PRN
  Administered 2017-12-09: 4 mg via INTRAVENOUS

## 2017-12-09 MED ORDER — ROPIVACAINE HCL 5 MG/ML IJ SOLN
INTRAMUSCULAR | Status: DC | PRN
  Administered 2017-12-09: 30 mL via PERINEURAL

## 2017-12-09 MED ORDER — OMEGA-3 FATTY ACIDS 1200 MG PO CAPS: 1200.0000 mg | ORAL_CAPSULE | Freq: Every day | ORAL | Status: DC

## 2017-12-09 MED ORDER — PRAVASTATIN SODIUM 40 MG PO TABS
20.0000 mg | ORAL_TABLET | Freq: Every evening | ORAL | Status: DC
  Administered 2017-12-09: 20 mg via ORAL
  Filled 2017-12-09: qty 1

## 2017-12-09 MED ORDER — TART CHERRY ADVANCED PO CAPS: 1.0000 | ORAL_CAPSULE | Freq: Every day | ORAL | Status: DC

## 2017-12-09 MED ORDER — HYDROCODONE-ACETAMINOPHEN 10-325 MG PO TABS: 1.0000 | ORAL_TABLET | Freq: Four times a day (QID) | ORAL | 0 refills | Status: DC | PRN

## 2017-12-09 MED ORDER — ACETAMINOPHEN 650 MG RE SUPP: 650.0000 mg | RECTAL | Status: DC | PRN

## 2017-12-09 MED ORDER — MIDAZOLAM HCL 2 MG/2ML IJ SOLN
INTRAMUSCULAR | Status: AC
  Filled 2017-12-09: qty 2

## 2017-12-09 MED ORDER — MIDAZOLAM HCL 5 MG/5ML IJ SOLN
INTRAMUSCULAR | Status: DC | PRN
  Administered 2017-12-09: 1 mg via INTRAVENOUS

## 2017-12-09 MED ORDER — FLUOXETINE HCL 20 MG PO CAPS
40.0000 mg | ORAL_CAPSULE | Freq: Every day | ORAL | Status: DC
  Administered 2017-12-10: 40 mg via ORAL
  Filled 2017-12-09: qty 2

## 2017-12-09 MED ORDER — PROPOFOL 10 MG/ML IV BOLUS
INTRAVENOUS | Status: AC
  Filled 2017-12-09: qty 40

## 2017-12-09 MED ORDER — CHLORHEXIDINE GLUCONATE 4 % EX LIQD: 60.0000 mL | Freq: Once | CUTANEOUS | Status: DC

## 2017-12-09 MED ORDER — PROPOFOL 10 MG/ML IV BOLUS
INTRAVENOUS | Status: DC | PRN
  Administered 2017-12-09: 130 mg via INTRAVENOUS

## 2017-12-09 MED ORDER — ROCURONIUM BROMIDE 10 MG/ML (PF) SYRINGE
PREFILLED_SYRINGE | INTRAVENOUS | Status: AC
  Filled 2017-12-09: qty 5

## 2017-12-09 MED ORDER — ALLOPURINOL 300 MG PO TABS
300.0000 mg | ORAL_TABLET | Freq: Every day | ORAL | Status: DC
  Administered 2017-12-10: 300 mg via ORAL
  Filled 2017-12-09 (×2): qty 1

## 2017-12-09 MED ORDER — COLCHICINE 0.6 MG PO TABS: 0.6000 mg | ORAL_TABLET | Freq: Every day | ORAL | Status: DC | PRN

## 2017-12-09 MED ORDER — OMEGA-3-ACID ETHYL ESTERS 1 G PO CAPS
1.0000 g | ORAL_CAPSULE | Freq: Every day | ORAL | Status: DC
  Administered 2017-12-10: 1 g via ORAL
  Filled 2017-12-09: qty 1

## 2017-12-09 MED ORDER — METOPROLOL SUCCINATE ER 50 MG PO TB24
50.0000 mg | ORAL_TABLET | Freq: Every day | ORAL | Status: DC
  Administered 2017-12-09 – 2017-12-10 (×2): 50 mg via ORAL
  Filled 2017-12-09 (×2): qty 1

## 2017-12-09 MED ORDER — BISACODYL 10 MG RE SUPP: 10.0000 mg | Freq: Every day | RECTAL | Status: DC | PRN

## 2017-12-09 MED ORDER — ONDANSETRON HCL 4 MG/2ML IJ SOLN
INTRAMUSCULAR | Status: AC
  Filled 2017-12-09: qty 2

## 2017-12-09 MED ORDER — MENTHOL 3 MG MT LOZG: 1.0000 | LOZENGE | OROMUCOSAL | Status: DC | PRN

## 2017-12-09 MED ORDER — ADULT MULTIVITAMIN W/MINERALS CH
1.0000 | ORAL_TABLET | Freq: Every day | ORAL | Status: DC
  Administered 2017-12-10: 1 via ORAL
  Filled 2017-12-09: qty 1

## 2017-12-09 MED ORDER — SENNA 8.6 MG PO TABS
1.0000 | ORAL_TABLET | Freq: Two times a day (BID) | ORAL | Status: DC
  Administered 2017-12-09 – 2017-12-10 (×3): 8.6 mg via ORAL
  Filled 2017-12-09 (×3): qty 1

## 2017-12-09 MED ORDER — ACETAMINOPHEN 500 MG PO TABS
1000.0000 mg | ORAL_TABLET | Freq: Four times a day (QID) | ORAL | Status: DC
  Administered 2017-12-09 (×3): 1000 mg via ORAL
  Filled 2017-12-09 (×4): qty 2

## 2017-12-09 MED ORDER — ZOLPIDEM TARTRATE 5 MG PO TABS: 5.0000 mg | ORAL_TABLET | Freq: Every evening | ORAL | Status: DC | PRN

## 2017-12-09 MED ORDER — ONDANSETRON HCL 4 MG PO TABS: 4.0000 mg | ORAL_TABLET | Freq: Four times a day (QID) | ORAL | Status: DC | PRN

## 2017-12-09 MED ORDER — ALPRAZOLAM 0.5 MG PO TABS: 0.5000 mg | ORAL_TABLET | Freq: Every evening | ORAL | Status: DC | PRN

## 2017-12-09 MED ORDER — LIDOCAINE 2% (20 MG/ML) 5 ML SYRINGE
INTRAMUSCULAR | Status: DC | PRN
  Administered 2017-12-09: 60 mg via INTRAVENOUS

## 2017-12-09 MED ORDER — POLYETHYLENE GLYCOL 3350 17 G PO PACK: 17.0000 g | PACK | Freq: Every day | ORAL | Status: DC | PRN

## 2017-12-09 MED ORDER — ROCURONIUM BROMIDE 10 MG/ML (PF) SYRINGE
PREFILLED_SYRINGE | INTRAVENOUS | Status: DC | PRN
Start: 2017-12-09 — End: 2017-12-09
  Administered 2017-12-09: 50 mg via INTRAVENOUS

## 2017-12-09 MED ORDER — PANTOPRAZOLE SODIUM 40 MG PO TBEC
80.0000 mg | DELAYED_RELEASE_TABLET | Freq: Every day | ORAL | Status: DC
  Administered 2017-12-10: 80 mg via ORAL
  Filled 2017-12-09: qty 2

## 2017-12-09 MED ORDER — HYDROCODONE-ACETAMINOPHEN 10-325 MG PO TABS
2.0000 | ORAL_TABLET | ORAL | Status: DC | PRN
  Administered 2017-12-10: 2 via ORAL
  Filled 2017-12-09 (×2): qty 2

## 2017-12-09 MED ORDER — LACTATED RINGERS IV SOLN
INTRAVENOUS | Status: DC | PRN
  Administered 2017-12-09 (×2): via INTRAVENOUS

## 2017-12-09 MED ORDER — KETOROLAC TROMETHAMINE 15 MG/ML IJ SOLN
7.5000 mg | Freq: Four times a day (QID) | INTRAMUSCULAR | Status: AC
  Administered 2017-12-09 – 2017-12-10 (×4): 7.5 mg via INTRAVENOUS
  Filled 2017-12-09 (×4): qty 1

## 2017-12-09 MED ORDER — OXYCODONE HCL 5 MG PO TABS: 5.0000 mg | ORAL_TABLET | Freq: Once | ORAL | Status: DC | PRN

## 2017-12-09 MED ORDER — ONDANSETRON HCL 4 MG PO TABS: 4.0000 mg | ORAL_TABLET | Freq: Three times a day (TID) | ORAL | 0 refills | Status: DC | PRN

## 2017-12-09 MED ORDER — BACLOFEN 10 MG PO TABS: 10.0000 mg | ORAL_TABLET | Freq: Three times a day (TID) | ORAL | 0 refills | Status: DC

## 2017-12-09 MED ORDER — METHOCARBAMOL 500 MG PO TABS
500.0000 mg | ORAL_TABLET | Freq: Four times a day (QID) | ORAL | Status: DC | PRN
  Administered 2017-12-10: 500 mg via ORAL
  Filled 2017-12-09: qty 1

## 2017-12-09 MED ORDER — SUGAMMADEX SODIUM 200 MG/2ML IV SOLN
INTRAVENOUS | Status: DC | PRN
  Administered 2017-12-09: 150 mg via INTRAVENOUS

## 2017-12-09 MED ORDER — METOCLOPRAMIDE HCL 5 MG/ML IJ SOLN: 5.0000 mg | Freq: Three times a day (TID) | INTRAMUSCULAR | Status: DC | PRN

## 2017-12-09 MED ORDER — METOCLOPRAMIDE HCL 5 MG PO TABS
5.0000 mg | ORAL_TABLET | Freq: Three times a day (TID) | ORAL | Status: DC | PRN
Start: 2017-12-09 — End: 2017-12-10

## 2017-12-09 MED ORDER — HYDROCODONE-ACETAMINOPHEN 10-325 MG PO TABS
1.0000 | ORAL_TABLET | ORAL | Status: DC | PRN
  Administered 2017-12-10 (×2): 1 via ORAL
  Filled 2017-12-09: qty 1

## 2017-12-09 MED ORDER — MEPERIDINE HCL 25 MG/ML IJ SOLN: 6.2500 mg | INTRAMUSCULAR | Status: DC | PRN

## 2017-12-09 MED ORDER — SPIRONOLACTONE 12.5 MG HALF TABLET
12.5000 mg | ORAL_TABLET | Freq: Every day | ORAL | Status: DC
  Administered 2017-12-10: 12.5 mg via ORAL
  Filled 2017-12-09: qty 1

## 2017-12-09 MED ORDER — HYDROMORPHONE HCL 1 MG/ML IJ SOLN
0.5000 mg | INTRAMUSCULAR | Status: DC | PRN
  Administered 2017-12-10 (×2): 0.5 mg via INTRAVENOUS
  Filled 2017-12-09 (×2): qty 0.5

## 2017-12-09 MED ORDER — ASPIRIN EC 81 MG PO TBEC
81.0000 mg | DELAYED_RELEASE_TABLET | Freq: Every day | ORAL | Status: DC
  Administered 2017-12-10: 81 mg via ORAL
  Filled 2017-12-09: qty 1

## 2017-12-09 MED ORDER — HYDROCHLOROTHIAZIDE 12.5 MG PO CAPS: 12.5000 mg | ORAL_CAPSULE | Freq: Every day | ORAL | Status: DC | PRN

## 2017-12-09 MED ORDER — 0.9 % SODIUM CHLORIDE (POUR BTL) OPTIME
TOPICAL | Status: DC | PRN
  Administered 2017-12-09: 1000 mL

## 2017-12-09 MED ORDER — LIDOCAINE 2% (20 MG/ML) 5 ML SYRINGE
INTRAMUSCULAR | Status: AC
  Filled 2017-12-09: qty 5

## 2017-12-09 MED ORDER — DEXAMETHASONE SODIUM PHOSPHATE 10 MG/ML IJ SOLN
INTRAMUSCULAR | Status: AC
  Filled 2017-12-09: qty 1

## 2017-12-09 MED ORDER — CEFAZOLIN SODIUM-DEXTROSE 2-4 GM/100ML-% IV SOLN
2.0000 g | Freq: Four times a day (QID) | INTRAVENOUS | Status: DC
  Administered 2017-12-09 – 2017-12-10 (×4): 2 g via INTRAVENOUS
  Filled 2017-12-09 (×4): qty 100

## 2017-12-09 MED ORDER — ONDANSETRON HCL 4 MG/2ML IJ SOLN: 4.0000 mg | Freq: Four times a day (QID) | INTRAMUSCULAR | Status: DC | PRN

## 2017-12-09 MED ORDER — ACETAMINOPHEN 325 MG PO TABS: 650.0000 mg | ORAL_TABLET | ORAL | Status: DC | PRN

## 2017-12-09 MED ORDER — FENTANYL CITRATE (PF) 250 MCG/5ML IJ SOLN
INTRAMUSCULAR | Status: AC
  Filled 2017-12-09: qty 5

## 2017-12-09 MED ORDER — PHENOL 1.4 % MT LIQD: 1.0000 | OROMUCOSAL | Status: DC | PRN

## 2017-12-09 MED ORDER — AMOXICILLIN-POT CLAVULANATE 875-125 MG PO TABS: 1.0000 | ORAL_TABLET | Freq: Two times a day (BID) | ORAL | 0 refills | Status: AC

## 2017-12-09 MED ORDER — MAGNESIUM CITRATE PO SOLN: 1.0000 | Freq: Once | ORAL | Status: DC | PRN

## 2017-12-09 MED ORDER — OXYCODONE HCL 5 MG/5ML PO SOLN: 5.0000 mg | Freq: Once | ORAL | Status: DC | PRN

## 2017-12-09 MED ORDER — FENTANYL CITRATE (PF) 100 MCG/2ML IJ SOLN
INTRAMUSCULAR | Status: DC | PRN
  Administered 2017-12-09 (×2): 50 ug via INTRAVENOUS

## 2017-12-09 MED ORDER — DIPHENHYDRAMINE HCL 12.5 MG/5ML PO ELIX: 12.5000 mg | ORAL_SOLUTION | ORAL | Status: DC | PRN

## 2017-12-09 MED ORDER — POTASSIUM CHLORIDE IN NACL 20-0.45 MEQ/L-% IV SOLN
INTRAVENOUS | Status: DC
  Filled 2017-12-09: qty 1000

## 2017-12-09 SURGICAL SUPPLY — 59 items
BIT DRILL 5/64X5 DISP (BIT) IMPLANT
BIT DRILL 7/64X5 DISP (BIT) ×3 IMPLANT
BIT DRILL TWIST 2.7 (BIT) ×2 IMPLANT
BIT DRILL TWIST 2.7MM (BIT) ×1
BLADE SAW SGTL MED 73X18.5 STR (BLADE) ×3 IMPLANT
CAPT SHLDR REVTOTAL 2 ×2 IMPLANT
CAPT SHOULDER REVTOTAL 2 ×1 IMPLANT
CLOSURE STERI-STRIP 1/2X4 (GAUZE/BANDAGES/DRESSINGS) ×1
CLSR STERI-STRIP ANTIMIC 1/2X4 (GAUZE/BANDAGES/DRESSINGS) ×2 IMPLANT
CONT SPEC 4OZ CLIKSEAL STRL BL (MISCELLANEOUS) ×3 IMPLANT
COVER SURGICAL LIGHT HANDLE (MISCELLANEOUS) ×3 IMPLANT
DRAPE HALF SHEET 40X57 (DRAPES) ×3 IMPLANT
DRAPE ORTHO SPLIT 77X108 STRL (DRAPES) ×4
DRAPE SURG ORHT 6 SPLT 77X108 (DRAPES) ×2 IMPLANT
DRAPE U-SHAPE 47X51 STRL (DRAPES) ×3 IMPLANT
DRSG MEPILEX BORDER 4X8 (GAUZE/BANDAGES/DRESSINGS) ×3 IMPLANT
DURAPREP 26ML APPLICATOR (WOUND CARE) ×6 IMPLANT
ELECT REM PT RETURN 9FT ADLT (ELECTROSURGICAL) ×3
ELECTRODE REM PT RTRN 9FT ADLT (ELECTROSURGICAL) ×1 IMPLANT
GLOVE BIOGEL PI ORTHO PRO SZ8 (GLOVE) ×4
GLOVE ORTHO TXT STRL SZ7.5 (GLOVE) ×3 IMPLANT
GLOVE PI ORTHO PRO STRL SZ8 (GLOVE) ×2 IMPLANT
GLOVE SURG ORTHO 8.0 STRL STRW (GLOVE) ×3 IMPLANT
GOWN STRL REUS W/ TWL XL LVL3 (GOWN DISPOSABLE) ×1 IMPLANT
GOWN STRL REUS W/TWL 2XL LVL3 (GOWN DISPOSABLE) ×3 IMPLANT
GOWN STRL REUS W/TWL XL LVL3 (GOWN DISPOSABLE) ×2
HANDPIECE INTERPULSE COAX TIP (DISPOSABLE) ×2
HOOD PEEL AWAY FACE SHEILD DIS (HOOD) ×6 IMPLANT
HOOD PEEL AWAY FLYTE STAYCOOL (MISCELLANEOUS) ×3 IMPLANT
KIT BASIN OR (CUSTOM PROCEDURE TRAY) ×3 IMPLANT
KIT ROOM TURNOVER OR (KITS) ×3 IMPLANT
MANIFOLD NEPTUNE II (INSTRUMENTS) ×3 IMPLANT
NS IRRIG 1000ML POUR BTL (IV SOLUTION) ×3 IMPLANT
PACK SHOULDER (CUSTOM PROCEDURE TRAY) ×3 IMPLANT
PAD ARMBOARD 7.5X6 YLW CONV (MISCELLANEOUS) ×6 IMPLANT
PIN HUMERAL STMN 3.2MMX9IN (INSTRUMENTS) ×3 IMPLANT
PIN STEINMANN THREADED TIP (PIN) ×3 IMPLANT
SET HNDPC FAN SPRY TIP SCT (DISPOSABLE) ×1 IMPLANT
SHOULDER CAPITATED REVTOTAL 2 ×1 IMPLANT
SLING ARM IMMOBILIZER LRG (SOFTGOODS) IMPLANT
SLING ARM IMMOBILIZER MED (SOFTGOODS) IMPLANT
SMARTMIX MINI TOWER (MISCELLANEOUS)
SUCTION FRAZIER HANDLE 10FR (MISCELLANEOUS) ×2
SUCTION TUBE FRAZIER 10FR DISP (MISCELLANEOUS) ×1 IMPLANT
SUPPORT WRAP ARM LG (MISCELLANEOUS) ×3 IMPLANT
SUT FIBERWIRE #2 38 REV NDL BL (SUTURE) ×9
SUT VIC AB 0 CT1 27 (SUTURE) ×2
SUT VIC AB 0 CT1 27XBRD ANBCTR (SUTURE) ×1 IMPLANT
SUT VIC AB 2-0 CT1 27 (SUTURE) ×2
SUT VIC AB 2-0 CT1 TAPERPNT 27 (SUTURE) ×1 IMPLANT
SUT VIC AB 3-0 SH 8-18 (SUTURE) ×3 IMPLANT
SUTURE FIBERWR#2 38 REV NDL BL (SUTURE) ×3 IMPLANT
SWAB COLLECTION DEVICE MRSA (MISCELLANEOUS) ×3 IMPLANT
SWAB CULTURE ESWAB REG 1ML (MISCELLANEOUS) ×3 IMPLANT
TOWEL OR 17X26 10 PK STRL BLUE (TOWEL DISPOSABLE) ×3 IMPLANT
TOWER SMARTMIX MINI (MISCELLANEOUS) IMPLANT
TUBE CONNECTING 12'X1/4 (SUCTIONS)
TUBE CONNECTING 12X1/4 (SUCTIONS) IMPLANT
YANKAUER SUCT BULB TIP NO VENT (SUCTIONS) ×3 IMPLANT

## 2017-12-09 NOTE — Transfer of Care (Signed)
Immediate Anesthesia Transfer of Care Note  Patient: Jessica Mcneil  Procedure(s) Performed: REVERSE TOTAL SHOULDER ARTHROPLASTY (Right Shoulder)  Patient Location: PACU  Anesthesia Type:GA combined with regional for post-op pain  Level of Consciousness: awake, alert , oriented and patient cooperative  Airway & Oxygen Therapy: Patient Spontanous Breathing and Patient connected to face mask oxygen  Post-op Assessment: Report given to RN and Post -op Vital signs reviewed and stable  Post vital signs: Reviewed and stable  Last Vitals:  Vitals:   12/09/17 0628 12/09/17 1001  BP: 118/64   Pulse: 74   Resp: 20   Temp: 36.6 C (!) 36.1 C  SpO2: 95%     Last Pain:  Vitals:   12/09/17 0646  TempSrc:   PainSc: 5       Patients Stated Pain Goal: 1 (07/86/75 4492)  Complications: No apparent anesthesia complications

## 2017-12-09 NOTE — Evaluation (Signed)
Physical Therapy Evaluation Patient Details Name: Jessica Mcneil MRN: [id number] DOB: 06-12-1941 Today's Date: 12/09/2017   History of Present Illness  Pt is a 76 y/o female s/p R reverse total shoulder arthroplasty. PMH includes CHF, arthritis, HTN, and R breast lumpectomy.   Clinical Impression  Pt is s/p surgery above with deficits below. PTA, pt was independent with mobility. Upon eval, pt presenting with RUE numbness, and slightly decreased balance. REquired min guard to supervision with mobility. Reports sister will be able to assist at d/c and recommend DME below. Follow up PT per MD arrangements. Will continue to follow acutely to maximize functional mobility independence and safety.     Follow Up Recommendations DC plan and follow up therapy as arranged by surgeon;Supervision for mobility/OOB    Equipment Recommendations  3in1 (PT)    Recommendations for Other Services       Precautions / Restrictions Precautions Precautions: Shoulder Type of Shoulder Precautions: Conservative protocol Shoulder Interventions: Shoulder sling/immobilizer;Off for dressing/bathing/exercises Precaution Booklet Issued: No Required Braces or Orthoses: Sling Restrictions Weight Bearing Restrictions: Yes RUE Weight Bearing: Non weight bearing      Mobility  Bed Mobility Overal bed mobility: Needs Assistance Bed Mobility: Supine to Sit;Sit to Supine     Supine to sit: Min guard Sit to supine: Supervision   General bed mobility comments: Min guard for steadying assist throughout transfer. Supervision for return to supine.   Transfers Overall transfer level: Needs assistance Equipment used: None Transfers: Sit to/from Stand Sit to Stand: Min guard         General transfer comment: Min guard for safety.   Ambulation/Gait Ambulation/Gait assistance: Min guard;Supervision Ambulation Distance (Feet): 200 Feet Assistive device: None Gait Pattern/deviations: Step-through  pattern;Decreased stride length Gait velocity: Decreased Gait velocity interpretation: Below normal speed for age/gender General Gait Details: Slow, slightly unsteady gait, however, no overt LOB. Very cautious during gait as well.   Stairs            Wheelchair Mobility    Modified Rankin (Stroke Patients Only)       Balance Overall balance assessment: Needs assistance Sitting-balance support: No upper extremity supported;Feet supported Sitting balance-Leahy Scale: Good     Standing balance support: No upper extremity supported;During functional activity Standing balance-Leahy Scale: Fair                               Pertinent Vitals/Pain Pain Assessment: No/denies pain    Home Living Family/patient expects to be discharged to:: Private residence Living Arrangements: Other relatives(sister ) Available Help at Discharge: Family;Available 24 hours/day Type of Home: House Home Access: Stairs to enter Entrance Stairs-Rails: None Entrance Stairs-Number of Steps: 3 Home Layout: One level Home Equipment: None      Prior Function Level of Independence: Independent               Hand Dominance   Dominant Hand: Right    Extremity/Trunk Assessment   Upper Extremity Assessment Upper Extremity Assessment: RUE deficits/detail RUE Deficits / Details: Numbness reported throughout RUE. Unable to move fingers at this time.     Lower Extremity Assessment Lower Extremity Assessment: Overall WFL for tasks assessed    Cervical / Trunk Assessment Cervical / Trunk Assessment: Kyphotic  Communication   Communication: No difficulties  Cognition Arousal/Alertness: Awake/alert Behavior During Therapy: WFL for tasks assessed/performed Overall Cognitive Status: Within Functional Limits for tasks assessed  General Comments      Exercises     Assessment/Plan    PT Assessment Patient needs  continued PT services  PT Problem List Decreased balance;Decreased mobility;Decreased knowledge of precautions;Impaired sensation       PT Treatment Interventions Gait training;Stair training;Functional mobility training;Therapeutic activities;Therapeutic exercise;Balance training;Neuromuscular re-education;Patient/family education    PT Goals (Current goals can be found in the Care Plan section)  Acute Rehab PT Goals Patient Stated Goal: to go home tomorrow  PT Goal Formulation: With patient Time For Goal Achievement: 12/16/17 Potential to Achieve Goals: Good    Frequency Min 5X/week   Barriers to discharge        Co-evaluation               AM-PAC PT "6 Clicks" Daily Activity  Outcome Measure Difficulty turning over in bed (including adjusting bedclothes, sheets and blankets)?: A Little Difficulty moving from lying on back to sitting on the side of the bed? : Unable Difficulty sitting down on and standing up from a chair with arms (e.g., wheelchair, bedside commode, etc,.)?: Unable Help needed moving to and from a bed to chair (including a wheelchair)?: A Little Help needed walking in hospital room?: A Little Help needed climbing 3-5 steps with a railing? : A Little 6 Click Score: 14    End of Session Equipment Utilized During Treatment: Gait belt Activity Tolerance: Patient tolerated treatment well Patient left: in bed;with call bell/phone within reach Nurse Communication: Mobility status PT Visit Diagnosis: Unsteadiness on feet (R26.81);Other abnormalities of gait and mobility (R26.89)    Time: 6811-5726 PT Time Calculation (min) (ACUTE ONLY): 19 min   Charges:   PT Evaluation $PT Eval Low Complexity: 1 Low     PT G Codes:        Leighton Ruff, PT, DPT  Acute Rehabilitation Services  Pager: [phone]   Rudean Hitt 12/09/2017, 2:27 PM

## 2017-12-09 NOTE — Discharge Instructions (Signed)

## 2017-12-09 NOTE — Anesthesia Preprocedure Evaluation (Signed)
Anesthesia Evaluation  Patient identified by MRN, date of birth, ID band Patient awake    Reviewed: Allergy & Precautions, H&P , NPO status , Patient's Chart, lab work & pertinent test results, reviewed documented beta blocker date and time   History of Anesthesia Complications (+) PONV and history of anesthetic complications  Airway Mallampati: II  TM Distance: >3 FB Neck ROM: full    Dental   Pulmonary shortness of breath and with exertion, former smoker,    breath sounds clear to auscultation       Cardiovascular hypertension, On Medications and On Home Beta Blockers +CHF   Rhythm:regular     Neuro/Psych  Headaches, PSYCHIATRIC DISORDERS    GI/Hepatic Neg liver ROS, GERD  Medicated and Controlled,  Endo/Other  negative endocrine ROS  Renal/GU negative Renal ROS  negative genitourinary   Musculoskeletal  (+) Arthritis , Osteoarthritis,    Abdominal   Peds  Hematology negative hematology ROS (+)   Anesthesia Other Findings See surgeon's H&P   Reproductive/Obstetrics negative OB ROS                             Anesthesia Physical  Anesthesia Plan  ASA: III  Anesthesia Plan: General   Post-op Pain Management:  Regional for Post-op pain   Induction: Intravenous  PONV Risk Score and Plan: 4 or greater and Treatment may vary due to age or medical condition, Ondansetron, Dexamethasone, Midazolam and Scopolamine patch - Pre-op  Airway Management Planned: Oral ETT  Additional Equipment:   Intra-op Plan:   Post-operative Plan: Extubation in OR  Informed Consent: I have reviewed the patients History and Physical, chart, labs and discussed the procedure including the risks, benefits and alternatives for the proposed anesthesia with the patient or authorized representative who has indicated his/her understanding and acceptance.   Dental Advisory Given  Plan Discussed with: CRNA and  Surgeon  Anesthesia Plan Comments:         Anesthesia Quick Evaluation

## 2017-12-09 NOTE — H&P (Signed)
PREOPERATIVE H&P  Chief Complaint: Right shoulder pain  HPI: Jessica Mcneil is a 76 y.o. female who presents for preoperative history and physical with a diagnosis of rotator cuff arthropathy right shoulder. Symptoms are rated as moderate to severe, and have been worsening.  This is significantly impairing activities of daily living.  She has elected for surgical management.   She has failed injections, activity modification, anti-inflammatories, and assistive devices and previous arthroscopic surgery  Preoperative MRI demonstrates evidence for failed rotator cuff repair with a large retracted massive tear.  Past Medical History:  Diagnosis Date  . Anxiety   . Arthritis   . CHF (congestive heart failure) ([place])    due to non ischemic cardiomyopathy  . Depression   . GERD (gastroesophageal reflux disease)   . Headache(784.0)    migraines  . HLD (hyperlipidemia)   . HTN (hypertension)   . Overweight(278.02)   . Pneumonia   . PONV (postoperative nausea and vomiting)    long time ago none recent  . Shortness of breath    Past Surgical History:  Procedure Laterality Date  . ANTERIOR AND POSTERIOR REPAIR  2003   bladder  . APPENDECTOMY    . BELPHAROPTOSIS REPAIR     bilat  . BREAST BIOPSY Right   . BREAST LUMPECTOMY WITH NEEDLE LOCALIZATION Right 02/12/2013   Procedure: BREAST LUMPECTOMY WITH NEEDLE LOCALIZATION;  Surgeon: Merrie Roof, MD;  Location: [facility];  Service: General;  Laterality: Right;  . CHOLECYSTECTOMY    . COLONOSCOPY    . EYE SURGERY     Glaucoma Implant  . TONSILLECTOMY    . VAGINAL HYSTERECTOMY  1998   Social History   Socioeconomic History  . Marital status: Divorced    Spouse name: None  . Number of children: None  . Years of education: None  . Highest education level: None  Social Needs  . Financial resource strain: None  . Food insecurity - worry: None  . Food insecurity - inability: None  . Transportation needs -  medical: None  . Transportation needs - non-medical: None  Occupational History  . Occupation: retired  Tobacco Use  . Smoking status: Former Smoker    Packs/day: 2.00    Types: Cigarettes    Last attempt to quit: 12/23/1982    Years since quitting: 34.9  . Smokeless tobacco: Never Used  Substance and Sexual Activity  . Alcohol use: No  . Drug use: No  . Sexual activity: None  Other Topics Concern  . None  Social History Narrative  . None   Family History  Problem Relation Age of Onset  . Heart disease Father   . Arthritis Unknown   . Hyperlipidemia Unknown    Allergies  Allergen Reactions  . Lipitor [Atorvastatin]     UNSPECIFIED REACTION   . Codeine Itching  . Erythromycin Nausea And Vomiting    GI UPSET  . Sulfamethoxazole Itching    Over 40 years ago   Prior to Admission medications   Medication Sig Start Date End Date Taking? Authorizing Provider  allopurinol (ZYLOPRIM) 300 MG tablet TAKE 1 TABLET BY MOUTH  DAILY 07/07/17  Yes Burchette, Alinda Sierras, MD  ALPRAZolam Duanne Moron) 0.5 MG tablet Take 1 tablet (0.5 mg total) by mouth at bedtime as needed. For sleep/anxiety 04/07/17  Yes Burchette, Alinda Sierras, MD  aspirin EC 81 MG tablet Take 81 mg by mouth daily.   Yes [provider]  CALCIUM-MAGNESIUM-ZINC PO Take 1 tablet  by mouth daily.   Yes [provider]  docusate sodium (COLACE) 100 MG capsule Take 200 mg by mouth at bedtime.   Yes [provider]  FLUoxetine (PROZAC) 20 MG capsule TAKE 2 CAPSULES BY MOUTH  ONCE DAILY 07/07/17  Yes Burchette, Alinda Sierras, MD  GARLIC PO Take 1 tablet by mouth 2 (two) times daily.   Yes [provider]  HYDROcodone-acetaminophen (NORCO/VICODIN) 5-325 MG per tablet Take 1 tablet by mouth every 6 (six) hours as needed. For migraines 05/08/15  Yes Burchette, Alinda Sierras, MD  metoprolol succinate (TOPROL-XL) 50 MG 24 hr tablet TAKE 1 TABLET BY MOUTH ONCE DAILY WITH OR IMMEDIATELY FOLLOWING A MEAL 12/03/16  Yes Dorothy Spark, MD  Misc Natural Products (TART CHERRY ADVANCED) CAPS Take 1 capsule by mouth daily. 1200 mg Daily   Yes [provider]  Omega-3 Fatty Acids 1200 MG CAPS Take 1,200 mg by mouth daily.   Yes [provider]  omeprazole (PRILOSEC) 20 MG capsule TAKE 1 CAPSULE BY MOUTH  DAILY 10/28/17  Yes Burchette, Alinda Sierras, MD  pravastatin (PRAVACHOL) 20 MG tablet TAKE 1 TABLET BY MOUTH  EVERY EVENING 07/07/17  Yes Burchette, Alinda Sierras, MD  spironolactone (ALDACTONE) 25 MG tablet TAKE ONE-HALF TABLET BY  MOUTH ONCE A DAY 10/28/17  Yes Dorothy Spark, MD  colchicine 0.6 MG tablet Take 1 tablet (0.6 mg total) by mouth daily. Patient taking differently: Take 0.6 mg by mouth daily as needed.  05/02/17   Bo Merino, MD  hydrochlorothiazide (MICROZIDE) 12.5 MG capsule Take 1 capsule (12.5 mg total) by mouth daily as needed (Swelling). 12/29/14   Clegg, Amy D, NP  Multiple Vitamin (MULTIVITAMIN WITH MINERALS) TABS Take 1 tablet by mouth daily.    [provider]     Positive ROS: All other systems have been reviewed and were otherwise negative with the exception of those mentioned in the HPI and as above.  Physical Exam: General: Alert, no acute distress Cardiovascular: No pedal edema Respiratory: No cyanosis, no use of accessory musculature GI: No organomegaly, abdomen is soft and non-tender Skin: No lesions in the area of chief complaint Neurologic: Sensation intact distally Psychiatric: Patient is competent for consent with normal mood and affect Lymphatic: No axillary or cervical lymphadenopathy  MUSCULOSKELETAL: Right shoulder active motion 0-60 degrees with profound weakness with supraspinatus and infraspinatus testing  Assessment: Right shoulder rotator cuff arthropathy   Plan: Plan for Procedure(s): TOTAL SHOULDER ARTHROPLASTY, reverse  The risks benefits and alternatives were discussed with the patient including but not limited to the risks of  nonoperative treatment, versus surgical intervention including infection, bleeding, nerve injury,  blood clots, cardiopulmonary complications, morbidity, mortality, among others, and they were willing to proceed.   Johnny Bridge, MD Cell [phone]   12/09/2017 7:23 AM

## 2017-12-09 NOTE — Op Note (Signed)
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  ANESTHESIA:   General with an interscalene block  ESTIMATED BLOOD LOSS: 100 mL  PREOPERATIVE INDICATIONS:  Jessica Mcneil is a  76 y.o. female who had a right shoulder massive rotator cuff tear that was partially repaired back in August, she failed to achieve any clinical improvement, and had persistent pain and loss of function.  Postoperative MRI demonstrated loss of integrity of the repair, with progression of rotator cuff arthropathy.  She did not demonstrate any clinical signs for infection during her postoperative course, however she did did have loss of function and inability to regain overhead activity, with associated severe pain with activities of daily living.  She elected for revision surgical management with conversion to reverse total shoulder replacement.  The risks benefits and alternatives were discussed with the patient preoperatively including but not limited to the risks of infection, bleeding, nerve injury, cardiopulmonary complications, the need for revision surgery, dislocation, brachial plexus palsy, incomplete relief of pain, among others, and the patient was willing to proceed.  OPERATIVE IMPLANTS: Biomet size 11 humeral stem press-fit standard with a 44 mm reverse shoulder arthroplasty tray with a standard liner and a 36 mm glenosphere with a mini baseplate and 4 locking screws and one central nonlocking screw.  OPERATIVE FINDINGS: The biceps tendon was present  at the level of the pectoralis, but absent in the groove.  There was a significant amount of fibrinous material that I removed from the joint.  There was hemorrhagic injection of the subscapularis as well as some of the remaining supraspinatus.  I did not see any true purulence.  She had significant stiffness during examination under anesthesia, with only 0-90 degrees passively with external rotation to neutral at best.  This did not yield with manipulation.  I was concerned about the possibility of infection, so I did send intraoperative cultures, but clinically it did not appear concerning enough to warrant abandoning the planned intended procedure.  The subscapularis was present, although the quality was somewhat poor.  The supraspinatus was completely torn.  The anchor was still contained within the bone, although 1 of the sutures had basically pulled loose, but 1 of the other sutures was still remaining.  Bone quality was fairly weak.  I was up to a size 11, the size 12 had significant scratch on the cortex, and I went with the 11 which did have appropriate press-fit stability, although I was concerned I might need to go larger, but I did not want to increase risk for periprosthetic fracture.  The bone fixation on the glenoid was excellent superiorly and centrally, although inferiorly I was only had about a 15, and I did have slight inferior declination of the implant with a metaphyseal blush, per standard routine.   I am planning on continuing with oral antibiotics until 2 weeks when the intraoperative cultures come back definitive regarding the possibility of Propionibacterium acnes.  OPERATIVE PROCEDURE: The patient was brought to the operating room and placed in the supine position. General anesthesia was administered. IV antibiotics were given. Time  out was performed. The upper extremity was prepped and draped in usual sterile fashion. The patient was in a beachchair position. Deltopectoral  approach was carried out. The biceps was tenodesed to the pectoralis tendon with #2 Fiberwire. The subscapularis was released off of the bone.  There was a significant fibrinous piece of material that was expressed from the joint, which I sent for culture and sensitivity, I also took swabs.  I then performed circumferential releases of the humerus, and then dislocated the head, and then reamed with the reamer to the above named size.  I then applied the jig, and cut the humeral head in 30 of retroversion, and then turned my attention to the glenoid.  Deep retractors were placed, and I resected the labrum, and then placed a guidepin into the center position on the glenoid, with slight inferior inclination. I then reamed over the guidepin, and this created a small metaphyseal cancellus blush inferiorly, removing just the cartilage to the subchondral bone superiorly. The base plate was selected and impacted place, and then I secured it centrally with a nonlocking screw, and I had excellent purchase both inferiorly and superiorly. I placed a short locking screws on anterior and posterior aspects.  I then turned my attention to the glenosphere, and impacted this into place, placing slight inferior offset (set on B).   The glenoid sphere was completely seated, and had engagement of the [facility] taper. I then turned my attention back to the humerus.  I sequentially broached, and then trialed, and was found to restore soft tissue tension, and it had 2 finger tightness. Therefore the above named components were selected. The shoulder felt stable throughout functional motion.  Before I placed the real prosthesis I had also placed a total of 3 #2 FiberWire through holes in the humerus for later subscapularis repair.  I then impacted the real prosthesis into place, as well as the real humeral tray, and reduced the shoulder. The shoulder had excellent motion, and was stable, and I irrigated the wounds copiously.     I then used these to repair the subscapularis. This came down to bone.  I then irrigated the shoulder copiously once more, repaired the deltopectoral interval with Vicryl followed by subcutaneous Vicryl with Steri-Strips and sterile gauze for the skin. The patient was awakened and returned back in stable and satisfactory condition. There no complications and She tolerated the procedure well.

## 2017-12-09 NOTE — Anesthesia Postprocedure Evaluation (Signed)
Anesthesia Post Note  Patient: Jessica Mcneil  Procedure(s) Performed: REVERSE TOTAL SHOULDER ARTHROPLASTY (Right Shoulder)     Patient location during evaluation: PACU Anesthesia Type: General Level of consciousness: awake and alert Pain management: pain level controlled Vital Signs Assessment: post-procedure vital signs reviewed and stable Respiratory status: spontaneous breathing, nonlabored ventilation and respiratory function stable Cardiovascular status: blood pressure returned to baseline and stable Postop Assessment: no apparent nausea or vomiting Anesthetic complications: no    Last Vitals:  Vitals:   12/09/17 1100 12/09/17 1125  BP:  118/64  Pulse: 83 84  Resp: (!) 21 20  Temp: (!) 36.1 C 36.5 C  SpO2: 93% 98%    Last Pain:  Vitals:   12/09/17 1125  TempSrc: Oral  PainSc:                  Lynda Rainwater

## 2017-12-09 NOTE — Anesthesia Procedure Notes (Signed)
Anesthesia Regional Block: Interscalene brachial plexus block   Pre-Anesthetic Checklist: ,, timeout performed, Correct Patient, Correct Site, Correct Laterality, Correct Procedure, Correct Position, site marked, Risks and benefits discussed,  Surgical consent,  Pre-op evaluation,  At surgeon's request and post-op pain management  Laterality: Right  Prep: chloraprep       Needles:  Injection technique: Single-shot  Needle Type: Stimiplex     Needle Length: 9cm  Needle Gauge: 21     Additional Needles:   Procedures:,,,, ultrasound used (permanent image in chart),,,,  Narrative:  Start time: 12/09/2017 7:09 AM End time: 12/09/2017 7:14 AM Injection made incrementally with aspirations every 5 mL.  Performed by: Personally  Anesthesiologist: Lynda Rainwater, MD

## 2017-12-09 NOTE — Anesthesia Procedure Notes (Signed)
Procedure Name: Intubation Date/Time: 12/09/2017 7:41 AM Performed by: Colin Benton, CRNA Pre-anesthesia Checklist: Emergency Drugs available, Patient identified, Suction available and Patient being monitored Patient Re-evaluated:Patient Re-evaluated prior to induction Oxygen Delivery Method: Circle system utilized Preoxygenation: Pre-oxygenation with 100% oxygen Induction Type: IV induction Ventilation: Mask ventilation without difficulty Laryngoscope Size: Miller and 2 Grade View: Grade I Tube type: Oral Tube size: 7.0 mm Number of attempts: 1 Airway Equipment and Method: Stylet Placement Confirmation: ETT inserted through vocal cords under direct vision,  positive ETCO2 and breath sounds checked- equal and bilateral Secured at: 23 cm Tube secured with: Tape Dental Injury: Teeth and Oropharynx as per pre-operative assessment

## 2017-12-10 ENCOUNTER — Encounter (HOSPITAL_COMMUNITY): Payer: Self-pay | Admitting: Orthopedic Surgery

## 2017-12-10 LAB — CBC
HEMATOCRIT: 32.4 % — AB (ref 36.0–46.0)
Hemoglobin: 11.1 g/dL — ABNORMAL LOW (ref 12.0–15.0)
MCH: 30.2 pg (ref 26.0–34.0)
MCHC: 34.3 g/dL (ref 30.0–36.0)
MCV: 88 fL (ref 78.0–100.0)
PLATELETS: 258 10*3/uL (ref 150–400)
RBC: 3.68 MIL/uL — ABNORMAL LOW (ref 3.87–5.11)
RDW: 14.6 % (ref 11.5–15.5)
WBC: 11.2 10*3/uL — AB (ref 4.0–10.5)

## 2017-12-10 LAB — BASIC METABOLIC PANEL
Anion gap: 11 (ref 5–15)
BUN: 13 mg/dL (ref 6–20)
CALCIUM: 8.2 mg/dL — AB (ref 8.9–10.3)
CO2: 24 mmol/L (ref 22–32)
CREATININE: 0.88 mg/dL (ref 0.44–1.00)
Chloride: 102 mmol/L (ref 101–111)
GFR calc Af Amer: >60 mL/min (ref >60)
GLUCOSE: 128 mg/dL — AB (ref 65–99)
Potassium: 3.8 mmol/L (ref 3.5–5.1)
Sodium: 137 mmol/L (ref 135–145)

## 2017-12-10 NOTE — Progress Notes (Signed)
Patient ID: Jessica Mcneil, female   DOB: 05-20-1941, 76 y.o.   MRN: [id number]     Subjective:  Patient reports pain as mild to moderate.  Patient doing okay and states that she would like to go home today  Objective:   VITALS:   Vitals:   12/09/17 2240 12/09/17 2348 12/10/17 0400 12/10/17 0726  BP: (!) 97/57 (!) 92/54 105/63 108/62  Pulse: 79 (!) 56 69 66  Resp:  18 18 18   Temp:  98.3 F (36.8 C) 97.8 F (36.6 C) 98.3 F (36.8 C)  TempSrc:  Oral Oral Oral  SpO2:  98% 96% 99%    ABD soft Sensation intact distally Dorsiflexion/Plantar flexion intact Incision: dressing C/D/I and no drainage   Lab Results  Component Value Date   WBC 11.2 (H) 12/10/2017   HGB 11.1 (L) 12/10/2017   HCT 32.4 (L) 12/10/2017   MCV 88.0 12/10/2017   PLT 258 12/10/2017   BMET    Component Value Date/Time   NA 137 12/10/2017 0355   K 3.8 12/10/2017 0355   CL 102 12/10/2017 0355   CO2 24 12/10/2017 0355   GLUCOSE 128 (H) 12/10/2017 0355   BUN 13 12/10/2017 0355   CREATININE 0.88 12/10/2017 0355   CREATININE 0.83 05/02/2017 0902   CALCIUM 8.2 (L) 12/10/2017 0355   GFRNONAA >60 12/10/2017 0355   GFRNONAA 70 05/02/2017 0902   GFRAA >60 12/10/2017 0355   GFRAA 80 05/02/2017 0902     Assessment/Plan: 1 Day Post-Op   Principal Problem:   Right rotator cuff tear arthropathy Active Problems:   Primary localized osteoarthrosis of shoulder   Advance diet Up with therapy Continue sling at all times NWB right upper ext DC home    [place] 12/10/2017, 9:20 AM  Discussed and agree with above.  Marchia Bond, MD Cell [phone]

## 2017-12-10 NOTE — Discharge Summary (Signed)
Physician Discharge Summary  Patient ID: Jessica Mcneil MRN: [id number] DOB/AGE: July 07, 1941 76 y.o.  Admit date: 12/09/2017 Discharge date: 12/10/2017  Admission Diagnoses:  Right rotator cuff tear arthropathy  Discharge Diagnoses:  Principal Problem:   Right rotator cuff tear arthropathy Active Problems:   Primary localized osteoarthrosis of shoulder   Past Medical History:  Diagnosis Date  . Anxiety   . Arthritis   . CHF (congestive heart failure) ([place])    due to non ischemic cardiomyopathy  . Depression   . GERD (gastroesophageal reflux disease)   . Headache(784.0)    migraines  . HLD (hyperlipidemia)   . HTN (hypertension)   . Overweight(278.02)   . Pneumonia   . PONV (postoperative nausea and vomiting)    long time ago none recent  . 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 Shortness of breath     Surgeries: Procedure(s): REVERSE TOTAL SHOULDER ARTHROPLASTY on 12/09/2017   Consultants (if any):   Discharged Condition: Improved  Hospital Course: Jessica Mcneil is an 76 y.o. female who was admitted 12/09/2017 with a diagnosis of Right rotator cuff tear arthropathy and went to the operating room on 12/09/2017 and underwent the above named procedures.    She was given perioperative antibiotics:  Anti-infectives (From admission, onward)   Start     Dose/Rate Route Frequency Ordered Stop   12/09/17 1330  ceFAZolin (ANCEF) IVPB 2g/100 mL premix     2 g 200 mL/hr over 30 Minutes Intravenous Every 6 hours 12/09/17 1128 12/12/17 1329   12/09/17 0700  ceFAZolin (ANCEF) IVPB 2g/100 mL premix     2 g 200 mL/hr over 30 Minutes Intravenous To [facility] 12/08/17 1354 12/09/17 0743   12/09/17 0000  amoxicillin-clavulanate (AUGMENTIN) 875-125 MG tablet     1 tablet Oral 2 times daily 12/09/17 0954 12/24/17 2359    .  She was given sequential compression devices, early ambulation, for DVT prophylaxis.  She benefited maximally from the hospital  stay and there were no complications.    Recent vital signs:  Vitals:   12/10/17 0400 12/10/17 0726  BP: 105/63 108/62  Pulse: 69 66  Resp: 18 18  Temp: 97.8 F (36.6 C) 98.3 F (36.8 C)  SpO2: 96% 99%    Recent laboratory studies:  Lab Results  Component Value Date   HGB 11.1 (L) 12/10/2017   HGB 14.7 12/04/2017   HGB 14.2 05/02/2017   Lab Results  Component Value Date   WBC 11.2 (H) 12/10/2017   PLT 258 12/10/2017   No results found for: INR Lab Results  Component Value Date   NA 137 12/10/2017   K 3.8 12/10/2017   CL 102 12/10/2017   CO2 24 12/10/2017   BUN 13 12/10/2017   CREATININE 0.88 12/10/2017   GLUCOSE 128 (H) 12/10/2017    Discharge Medications:   Allergies as of 12/10/2017      Reactions   Lipitor [atorvastatin]    UNSPECIFIED REACTION    Codeine Itching   Erythromycin Nausea And Vomiting   GI UPSET   Sulfamethoxazole Itching   Over 40 years ago      Medication List    STOP taking these medications   HYDROcodone-acetaminophen 5-325 MG tablet Commonly known as:  NORCO/VICODIN Replaced by:  HYDROcodone-acetaminophen 10-325 MG tablet     TAKE these medications   allopurinol 300 MG tablet Commonly known as:  ZYLOPRIM TAKE 1 TABLET BY MOUTH  DAILY   ALPRAZolam 0.5 MG tablet Commonly known  as:  XANAX Take 1 tablet (0.5 mg total) by mouth at bedtime as needed. For sleep/anxiety   amoxicillin-clavulanate 875-125 MG tablet Commonly known as:  AUGMENTIN Take 1 tablet by mouth 2 (two) times daily for 15 days.   aspirin EC 81 MG tablet Take 81 mg by mouth daily.   baclofen 10 MG tablet Commonly known as:  LIORESAL Take 1 tablet (10 mg total) by mouth 3 (three) times daily. As needed for muscle spasm   CALCIUM-MAGNESIUM-ZINC PO Take 1 tablet by mouth daily.   colchicine 0.6 MG tablet Take 1 tablet (0.6 mg total) by mouth daily. What changed:    when to take this  reasons to take this   docusate sodium 100 MG capsule Commonly  known as:  COLACE Take 200 mg by mouth at bedtime.   FLUoxetine 20 MG capsule Commonly known as:  PROZAC TAKE 2 CAPSULES BY MOUTH  ONCE DAILY   GARLIC PO Take 1 tablet by mouth 2 (two) times daily.   hydrochlorothiazide 12.5 MG capsule Commonly known as:  MICROZIDE Take 1 capsule (12.5 mg total) by mouth daily as needed (Swelling).   HYDROcodone-acetaminophen 10-325 MG tablet Commonly known as:  NORCO Take 1-2 tablets by mouth every 6 (six) hours as needed. Replaces:  HYDROcodone-acetaminophen 5-325 MG tablet   metoprolol succinate 50 MG 24 hr tablet Commonly known as:  TOPROL-XL TAKE 1 TABLET BY MOUTH ONCE DAILY WITH OR IMMEDIATELY FOLLOWING A MEAL   multivitamin with minerals Tabs tablet Take 1 tablet by mouth daily.   Omega-3 Fatty Acids 1200 MG Caps Take 1,200 mg by mouth daily.   omeprazole 20 MG capsule Commonly known as:  PRILOSEC TAKE 1 CAPSULE BY MOUTH  DAILY   ondansetron 4 MG tablet Commonly known as:  ZOFRAN Take 1 tablet (4 mg total) by mouth every 8 (eight) hours as needed for nausea or vomiting.   pravastatin 20 MG tablet Commonly known as:  PRAVACHOL TAKE 1 TABLET BY MOUTH  EVERY EVENING   spironolactone 25 MG tablet Commonly known as:  ALDACTONE TAKE ONE-HALF TABLET BY  MOUTH ONCE A DAY   TART CHERRY ADVANCED Caps Take 1 capsule by mouth daily. 1200 mg Daily       Diagnostic Studies: Dg Shoulder Right Port  Result Date: 12/09/2017 CLINICAL DATA:  Followup shoulder replacement EXAM: PORTABLE RIGHT SHOULDER COMPARISON:  None. FINDINGS: Single image shows reverse right shoulder arthroplasty. Components appear well positioned. No radiographically detectable complication. IMPRESSION: Right shoulder arthroplasty. Electronically Signed   By: Nelson Chimes M.D.   On: 12/09/2017 10:17    Disposition: 01-Home or Self Care    Follow-up Information    Jessica Bond, MD. Schedule an appointment as soon as possible for a visit in 2 weeks.   Specialty:   Orthopedic Surgery Contact information: [address] [place] [address] [phone]            Signed: Johnny Mcneil 12/10/2017, 11:06 AM

## 2017-12-10 NOTE — Progress Notes (Signed)
Physical Therapy Treatment Patient Details Name: Jessica Mcneil MRN: [id number] DOB: Jun 20, 1941 Today's Date: 12/10/2017    History of Present Illness Pt is a 76 y/o female s/p R reverse total shoulder arthroplasty. PMH includes CHF, arthritis, HTN, and R breast lumpectomy.     PT Comments    Pt progressing towards physical therapy goals. Was able to perform transfers and ambulation with gross min guard assist for balance support and safety. Pt reports she feels "drunk from medication" throughout session. Appeared slightly unsteady at times but no overt LOB noted. Sister present for stair training and educated on sequencing and safety. Will continue to follow and progress as able per POC.    Follow Up Recommendations  DC plan and follow up therapy as arranged by surgeon;Supervision for mobility/OOB     Equipment Recommendations  3in1 (PT)    Recommendations for Other Services       Precautions / Restrictions Precautions Precautions: Shoulder Type of Shoulder Precautions: Conservative protocol Shoulder Interventions: Shoulder sling/immobilizer;Off for dressing/bathing/exercises Precaution Booklet Issued: No Required Braces or Orthoses: Sling Restrictions Weight Bearing Restrictions: No RUE Weight Bearing: Non weight bearing    Mobility  Bed Mobility Overal bed mobility: Needs Assistance Bed Mobility: Supine to Sit;Sit to Supine     Supine to sit: Supervision Sit to supine: Supervision   General bed mobility comments: Close supervision for safety. Pt reports feeling "woozy" upon sitting up and noted trunk sway.   Transfers Overall transfer level: Needs assistance Equipment used: None Transfers: Sit to/from Stand Sit to Stand: Min guard         General transfer comment: Close guard for safety. Unsteady upon stand but no assist required.   Ambulation/Gait Ambulation/Gait assistance: Min guard;Supervision Ambulation Distance (Feet): 175 Feet Assistive device:  None Gait Pattern/deviations: Step-through pattern;Decreased stride length Gait velocity: Decreased Gait velocity interpretation: Below normal speed for age/gender General Gait Details: Slow, slightly unsteady gait, however, no overt LOB. Reports feeling "drunk". Min guard at times   Stairs Stairs: Yes   Stair Management: One rail Right;Step to pattern;Sideways Number of Stairs: 5 General stair comments: Pt held to R railing with L hand (facing rail) to simulate home environment. Pt did not require assistance, however hands-on guarding provided for safety. Sister present throughout for education.   Wheelchair Mobility    Modified Rankin (Stroke Patients Only)       Balance Overall balance assessment: Needs assistance Sitting-balance support: No upper extremity supported;Feet supported Sitting balance-Leahy Scale: Good     Standing balance support: No upper extremity supported;During functional activity Standing balance-Leahy Scale: Fair                              Cognition Arousal/Alertness: Awake/alert Behavior During Therapy: WFL for tasks assessed/performed Overall Cognitive Status: Within Functional Limits for tasks assessed                                        Exercises      General Comments        Pertinent Vitals/Pain Pain Assessment: No/denies pain    Home Living                      Prior Function            PT Goals (current goals can now be found in the  care plan section) Acute Rehab PT Goals Patient Stated Goal: to go home tomorrow  PT Goal Formulation: With patient Time For Goal Achievement: 12/16/17 Potential to Achieve Goals: Good Progress towards PT goals: Progressing toward goals    Frequency    Min 5X/week      PT Plan Current plan remains appropriate    Co-evaluation              AM-PAC PT "6 Clicks" Daily Activity  Outcome Measure  Difficulty turning over in bed (including  adjusting bedclothes, sheets and blankets)?: A Little Difficulty moving from lying on back to sitting on the side of the bed? : A Little Difficulty sitting down on and standing up from a chair with arms (e.g., wheelchair, bedside commode, etc,.)?: A Little Help needed moving to and from a bed to chair (including a wheelchair)?: A Little Help needed walking in hospital room?: A Little Help needed climbing 3-5 steps with a railing? : A Little 6 Click Score: 18    End of Session Equipment Utilized During Treatment: Gait belt Activity Tolerance: Patient tolerated treatment well Patient left: in bed;with call bell/phone within reach Nurse Communication: Mobility status PT Visit Diagnosis: Unsteadiness on feet (R26.81);Other abnormalities of gait and mobility (R26.89)     Time: 5284-1324 PT Time Calculation (min) (ACUTE ONLY): 20 min  Charges:  $Gait Training: 8-22 mins                    G Codes:       Rolinda Roan, PT, DPT Acute Rehabilitation Services Pager: [phone]    Thelma Comp 12/10/2017, 9:55 AM

## 2017-12-10 NOTE — Evaluation (Signed)
Occupational Therapy Evaluation Patient Details Name: Jessica Mcneil MRN: [id number] DOB: August 17, 1941 Today's Date: 12/10/2017    History of Present Illness Pt is a 76 y/o female s/p R reverse total shoulder arthroplasty. PMH includes CHF, arthritis, HTN, and R breast lumpectomy.    Clinical Impression   Patient is s/p R reverse TSA surgery resulting in functional limitations due to the deficits listed below (see OT problem list). Pt with LOB x3 during session with OT during adls. OT called notified PT Mickel Baas of [place] during session. Pt with sister that is able to (A) at all times upon d/c home.  Patient will benefit from skilled OT acutely to increase independence and safety with ADLS to allow discharge home.     Follow Up Recommendations  No OT follow up    Equipment Recommendations  None recommended by OT    Recommendations for Other Services       Precautions / Restrictions Precautions Precautions: Shoulder Type of Shoulder Precautions: Conservative protocol Shoulder Interventions: Shoulder sling/immobilizer;Off for dressing/bathing/exercises Precaution Booklet Issued: No Required Braces or Orthoses: Sling Restrictions Weight Bearing Restrictions: Yes RUE Weight Bearing: Non weight bearing      Mobility Bed Mobility Overal bed mobility: Needs Assistance Bed Mobility: Supine to Sit     Supine to sit: Supervision Sit to supine: Supervision   General bed mobility comments: requires HOB elevated during transfer. plans to sit in recliner and sleep in recliner at home  Transfers Overall transfer level: Needs assistance Equipment used: None Transfers: Sit to/from Stand Sit to Stand: Min guard         General transfer comment: pt bracing against bed surface    Balance Overall balance assessment: Needs assistance Sitting-balance support: Single extremity supported Sitting balance-Leahy Scale: Good     Standing balance support: Single extremity supported;During  functional activity Standing balance-Leahy Scale: Fair                             ADL either performed or assessed with clinical judgement   ADL Overall ADL's : Needs assistance/impaired Eating/Feeding: Set up   Grooming: Set up   Upper Body Bathing: Moderate assistance   Lower Body Bathing: Moderate assistance   Upper Body Dressing : Moderate assistance   Lower Body Dressing: Moderate assistance   Toilet Transfer: Minimal assistance         Tub/Shower Transfer Details (indicate cue type and reason): advised for sponge bath only at this time due to balance Functional mobility during ADLs: Minimal assistance General ADL Comments: pt with x3 LOB during session and reports feeling "off" after taking medication. Pt also self reports changes to her vision. pt reports not having eye drops in the PM for her glaucoma   Shoulder precautions ( handout provided): Educated patient on don doff brace with return demonstration,washing under arms with cloth, never to wash directly on incision site, avoid shoulder movement. positioning with pillows in chair for , sleeping positioning (recommend recliner if possible), pt educated on dressing care during bathing/ dressing.     Vision Baseline Vision/History: Glaucoma Wears Glasses: Reading only Patient Visual Report: Blurring of vision Additional Comments: pt reports its getting better but my vision is blurry at this time. it always is a little after this. dont worry it iwll be better     Perception     Praxis      Pertinent Vitals/Pain Pain Assessment: No/denies pain     Hand Dominance Right  Extremity/Trunk Assessment Upper Extremity Assessment Upper Extremity Assessment: RUE deficits/detail RUE Deficits / Details: s/p surg with block wearing off   Lower Extremity Assessment Lower Extremity Assessment: Defer to PT evaluation   Cervical / Trunk Assessment Cervical / Trunk Assessment: Kyphotic   Communication  Communication Communication: No difficulties   Cognition Arousal/Alertness: Awake/alert Behavior During Therapy: WFL for tasks assessed/performed Overall Cognitive Status: Within Functional Limits for tasks assessed                                     General Comments  dressing dry and intact    Exercises Exercises: Shoulder Shoulder Exercises Elbow Flexion: AROM;Right;10 reps;Seated Wrist Flexion: AROM;Right;10 reps;Seated Digit Composite Flexion: AROM;Right;10 reps;Seated Neck Flexion: AROM;10 reps   Shoulder Instructions Shoulder Instructions Donning/doffing shirt without moving shoulder: Caregiver independent with task Method for sponge bathing under operated UE: Caregiver independent with task Donning/doffing sling/immobilizer: Caregiver independent with task Correct positioning of sling/immobilizer: Caregiver independent with task ROM for elbow, wrist and digits of operated UE: Patient able to independently direct caregiver;Independent Sling wearing schedule (on at all times/off for ADL's): Patient able to independently direct caregiver;Independent Proper positioning of operated UE when showering: Caregiver independent with task Positioning of UE while sleeping: Caregiver independent with task;Independent    Home Living Family/patient expects to be discharged to:: Private residence Living Arrangements: Other relatives Available Help at Discharge: Family;Available 24 hours/day Type of Home: House Home Access: Stairs to enter [employer] of Steps: 3 Entrance Stairs-Rails: None Home Layout: One level     Bathroom Shower/Tub: Teacher, early years/pre: Handicapped height     Home Equipment: None   Additional Comments: lives with sister that can give 24/7 (A)       Prior Functioning/Environment Level of Independence: Independent                 OT Problem List: Decreased strength;Decreased activity tolerance;Impaired  balance (sitting and/or standing)      OT Treatment/Interventions:      OT Goals(Current goals can be found in the care plan section) Acute Rehab OT Goals Patient Stated Goal: to go home today OT Goal Formulation: With patient  OT Frequency:     Barriers to D/C:            Co-evaluation              AM-PAC PT "6 Clicks" Daily Activity     Outcome Measure Help from another person eating meals?: None Help from another person taking care of personal grooming?: A Little Help from another person toileting, which includes using toliet, bedpan, or urinal?: A Little Help from another person bathing (including washing, rinsing, drying)?: A Little Help from another person to put on and taking off regular upper body clothing?: A Little Help from another person to put on and taking off regular lower body clothing?: A Little 6 Click Score: 19   End of Session Nurse Communication: Mobility status;Precautions  Activity Tolerance: Patient tolerated treatment well Patient left: in chair;with call bell/phone within reach;with family/visitor present  OT Visit Diagnosis: Unsteadiness on feet (R26.81)                Time: 5643-3295 OT Time Calculation (min): 37 min Charges:  OT General Charges $OT Visit: 1 Visit OT Evaluation $OT Eval Moderate Complexity: 1 Mod OT Treatments $Self Care/Home Management : 8-22 mins G-Codes:  Jeri Modena   OTR/L Pager: [phone] Office: [phone] .   Parke Poisson B 12/10/2017, 10:55 AM

## 2017-12-10 NOTE — Plan of Care (Signed)
  Progressing Education: Knowledge of the prescribed therapeutic regimen will improve 12/10/2017 0439 - Progressing by Jonnie Finner, RN Understanding of activity limitations/precautions following surgery will improve 12/10/2017 0439 - Progressing by Jonnie Finner, RN Activity: Ability to tolerate increased activity will improve 12/10/2017 0439 - Progressing by Jonnie Finner, RN Pain Management: Pain level will decrease with appropriate interventions 12/10/2017 0439 - Progressing by Jonnie Finner, RN

## 2017-12-10 NOTE — Progress Notes (Signed)
Pt doing well. Pt and sister given D/C instructions with verbal understanding. Rx's were sent to the pharmacy by MD. Pt's incision is clean and dry with no sign of infection. Pt's IV was removed prior to D/C. Pt D/C'd home via wheelchair @ 1130 per MD order. Pt is stable @ D/C and has no other needs at this time. Holli Humbles, RN

## 2017-12-23 LAB — AEROBIC/ANAEROBIC CULTURE (SURGICAL/DEEP WOUND): CULTURE: NO GROWTH

## 2017-12-24 DIAGNOSIS — M25511 Pain in right shoulder: Secondary | ICD-10-CM | POA: Diagnosis not present

## 2017-12-24 DIAGNOSIS — M19011 Primary osteoarthritis, right shoulder: Secondary | ICD-10-CM | POA: Diagnosis not present

## 2018-01-15 ENCOUNTER — Other Ambulatory Visit: Payer: Self-pay | Admitting: Cardiology

## 2018-01-15 ENCOUNTER — Other Ambulatory Visit: Payer: Self-pay | Admitting: Family Medicine

## 2018-01-21 DIAGNOSIS — M19011 Primary osteoarthritis, right shoulder: Secondary | ICD-10-CM | POA: Diagnosis not present

## 2018-01-27 DIAGNOSIS — H401432 Capsular glaucoma with pseudoexfoliation of lens, bilateral, moderate stage: Secondary | ICD-10-CM | POA: Diagnosis not present

## 2018-01-28 DIAGNOSIS — M25511 Pain in right shoulder: Secondary | ICD-10-CM | POA: Diagnosis not present

## 2018-02-04 DIAGNOSIS — M25511 Pain in right shoulder: Secondary | ICD-10-CM | POA: Diagnosis not present

## 2018-02-11 DIAGNOSIS — M25511 Pain in right shoulder: Secondary | ICD-10-CM | POA: Diagnosis not present

## 2018-02-16 ENCOUNTER — Other Ambulatory Visit: Payer: Self-pay | Admitting: Family Medicine

## 2018-02-16 DIAGNOSIS — Z1231 Encounter for screening mammogram for malignant neoplasm of breast: Secondary | ICD-10-CM

## 2018-02-25 DIAGNOSIS — M25511 Pain in right shoulder: Secondary | ICD-10-CM | POA: Diagnosis not present

## 2018-02-25 DIAGNOSIS — M19011 Primary osteoarthritis, right shoulder: Secondary | ICD-10-CM | POA: Diagnosis not present

## 2018-03-02 ENCOUNTER — Ambulatory Visit: Payer: Medicare Other | Admitting: Cardiology

## 2018-03-02 ENCOUNTER — Encounter: Payer: Self-pay | Admitting: Cardiology

## 2018-03-02 VITALS — BP 100/70 | HR 85 | Ht 67.0 in | Wt 172.0 lb

## 2018-03-02 DIAGNOSIS — I10 Essential (primary) hypertension: Secondary | ICD-10-CM | POA: Diagnosis not present

## 2018-03-02 DIAGNOSIS — E785 Hyperlipidemia, unspecified: Secondary | ICD-10-CM

## 2018-03-02 DIAGNOSIS — I428 Other cardiomyopathies: Secondary | ICD-10-CM | POA: Diagnosis not present

## 2018-03-02 DIAGNOSIS — Z789 Other specified health status: Secondary | ICD-10-CM

## 2018-03-02 DIAGNOSIS — I5022 Chronic systolic (congestive) heart failure: Secondary | ICD-10-CM

## 2018-03-02 NOTE — Patient Instructions (Signed)
Medication Instructions:   Your physician recommends that you continue on your current medications as directed. Please refer to the Current Medication list given to you today.    Labwork:  TOMORROW 03/03/18 TO CHECK LFTS AND LIPIDS--PLEASE COME FASTING TO THIS LAB APPOINTMENT       Follow-Up:  Your physician wants you to follow-up in: [place] will receive a reminder letter in the mail two months in advance. If you don't receive a letter, please call our office to schedule the follow-up appointment.        If you need a refill on your cardiac medications before your next appointment, please call your pharmacy.

## 2018-03-02 NOTE — Progress Notes (Signed)
Patient ID: LENDY DITTRICH, female   DOB: 15-Apr-1941, 77 y.o.   MRN: [id number]    CARDIOLOGY CLINIC NOTE  Patient ID: SHAWANA KNOCH, female   DOB: [birth year], 77 y.o.   MRN: [id number]  HPI: Everlyn is a delightful 77 year old woman with a history of a nonischemic cardiomyopathy, hyperlipidemia. She also has a hx of a R lumpectomy for benign dz 2/14.  1. 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   February 19, 2006 cardiac catheterization revealing normal            coronary arteries with an EF of 50%. EF 35-40% by ECHO.      b.   Echo 2008: EF 40-50% with mild diffuse            LV hypokinesis and abnormal left ventricular relaxation.  Mild AI.      c.   Echo 8/09 EF 55-60%. Trivial AI     d.   CPX 4/07, pVO2: 20.6 (105% predicted) slope of 42.7.  pRER 1.09.            There was a mild restriction on her spirometry.     e. Echo 4/12 EF 60-65% grade 1 DD. Mild MR/AI      f. Echo 7/15 EF 45-50% global HK. Grade 1 DD. Mild RV dysfx. Mild AI/TR     g. Echo 9/16 EF 50-55% (read as 45-50%) Grade 1 DD. Normal RV Mild AI     H. Echo 12/17 EF 45-50% (looks like 50%), Mild AI, Normal RV  She transitioned her care from Dr Haroldine Laws, she was last seen in March 2017, pravastatin was added to her regimen as her LDL was 180 with improvement to 120, we tried to increase to 20 mg a day however she couldn't tolerated because of significant muscle pain and decreased back to 10 mg daily. She has been dealing with gout and increase her allopurinol from 100-202 300 mg by mouth twice a day in the last months she had 2 episodes of gout. T  01/31/2017 - patient is coming after 2 months, at the last visit spironolactone 12.5 mg was added to her regimen and she is tolerating it she denies any chest pain, shortness of breath, she uses hydrochlorothiazide for edema as needed and she didn't need to use it in the last 2 months. She denies any orthopnea or proximal nocturnal dyspnea. Since I saw her last  time she felt device both of those were mechanical falls but on one occasion she hit the back of her head with significant bruising but didn't go to the ER.  06/04/2017 - the patient is coming after 4 months, she has been doing great, she denies any chest pain, she has dyspnea on moderate exertion that has been unchanged. She denies any lower extremity edema no orthopnea or paroxysmal nocturnal dyspnea. She denies any palpitations, no claudications dizziness or syncope. She has been compliant to medicine and is able to tolerate half dose of pravastatin.  03/02/2018 - 6 months follow up, she underwent right partial shoulder replacement and is undergoing physical therapy. She is feeling well, denies chest pain, DOE, no LE edema, orthopnea, PND. No palpitations, claudications. Complaint with meds and tolerating them well.   ROS: All systems negative except as listed in HPI, PMH and Problem List.  Past Medical History:  Diagnosis Date  . Anxiety   . Arthritis   . CHF (congestive heart failure) ([place])    due to  non ischemic cardiomyopathy  . Depression   . GERD (gastroesophageal reflux disease)   . Headache(784.0)    migraines  . HLD (hyperlipidemia)   . HTN (hypertension)   . Overweight(278.02)   . Pneumonia   . PONV (postoperative nausea and vomiting)    long time ago none recent  . 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 Shortness of breath     Current Outpatient Medications  Medication Sig Dispense Refill  . allopurinol (ZYLOPRIM) 300 MG tablet TAKE 1 TABLET BY MOUTH  DAILY 90 tablet 2  . ALPRAZolam (XANAX) 0.5 MG tablet Take 1 tablet (0.5 mg total) by mouth at bedtime as needed. For sleep/anxiety 90 tablet 0  . aspirin EC 81 MG tablet Take 81 mg by mouth daily.    [name] CALCIUM-MAGNESIUM-ZINC PO Take 1 tablet by mouth daily.    . colchicine 0.6 MG tablet Take 1 tablet (0.6 mg total) by mouth daily. (Patient taking differently: Take 0.6 mg by mouth daily as needed. ) 30 tablet 5   . docusate sodium (COLACE) 100 MG capsule Take 200 mg by mouth at bedtime.    [name] FLUoxetine (PROZAC) 20 MG capsule TAKE 2 CAPSULES BY MOUTH  ONCE DAILY 180 capsule 2  . GARLIC PO Take 1 tablet by mouth 2 (two) times daily.    . hydrochlorothiazide (MICROZIDE) 12.5 MG capsule Take 1 capsule (12.5 mg total) by mouth daily as needed (Swelling). 15 capsule 3  . HYDROcodone-acetaminophen (NORCO) 10-325 MG tablet Take 1-2 tablets by mouth every 6 (six) hours as needed. 50 tablet 0  . metoprolol succinate (TOPROL-XL) 50 MG 24 hr tablet TAKE 1 TABLET BY MOUTH ONCE DAILY WITH OR IMMEDIATELY FOLLOWING A MEAL. Please keep upcoming appt. Thank you 90 tablet 1  . Misc Natural Products (TART CHERRY ADVANCED) CAPS Take 1 capsule by mouth daily. 1200 mg Daily    . Multiple Vitamin (MULTIVITAMIN WITH MINERALS) TABS Take 1 tablet by mouth daily.    . Omega-3 Fatty Acids 1200 MG CAPS Take 1,200 mg by mouth daily.    [name] omeprazole (PRILOSEC) 20 MG capsule TAKE 1 CAPSULE BY MOUTH  DAILY 90 capsule 1  . pravastatin (PRAVACHOL) 20 MG tablet TAKE 1 TABLET BY MOUTH  EVERY EVENING 90 tablet 2  . spironolactone (ALDACTONE) 25 MG tablet TAKE ONE-HALF TABLET BY  MOUTH ONCE A DAY 45 tablet 1   No current facility-administered medications for this visit.    Vitals:   03/02/18 1543  BP: 100/70  Pulse: 85  SpO2: 97%  Weight: 172 lb (78 kg)  Height: 5\' 7"  (1.702 m)    PHYSICAL EXAM: General:  Well appearing. No resp difficulty HEENT: normal Neck: supple. JVP 5-6 Carotids 2+ bilaterally; no bruits. No lymphadenopathy or thryomegaly appreciated. Cor: PMI normal. Regular rate & rhythm. No rubs, murmurs.  Lungs: clear Abdomen: obese soft, nontender, nondistended. No hepatosplenomegaly. No bruits or masses. Good bowel sounds. Extremities: no cyanosis, clubbing, rash, edema Neuro: alert & orientedx3, cranial nerves grossly intact. Moves all 4 extremities w/o difficulty. Affect pleasant.  EKG on 10/31/2016 shows normal  sinus rhythm nonspecific T wave abnormalities that are unchanged from 03/12/2016    ASSESSMENT & PLAN:  1) Acute on chronic systolic HF: Nonischemic cardiomyopathy. With prior history of low EF the most recent echo in 12/17 showed LVEF 50%, we added spironolactone 12.5 mg by mouth daily. She wasn't able to tolerate low-dose ACE-I due to hypotension - Continue Toprol XL 50 mg daily. Continue spironolactone 12.5  mg daily. - She is euvolemic, functional class NYHA I - HCTZ only as needed, we need to use it, advised to use it sparsely because of history of gout. - no reason to repeat echo as she is asymptomatic - advised to start walking again   2) Essential hypertension -well controlled.  3) gout - Patient is told to continue same dose of Allopurinol, she was prescribed Colchicine by rheumatologist,  She developed significant diarrhea, for now she will only use it for flareups.  4) HL - Cholesterol improved with pravastatin 10 mg po daily, decreased from 180-120-137, she was not able to tolerate higher dose of pravastatin.  - - we will repeat LFTs and lipids today.   Follow up in 6 months.  Fabianna Keats,MD 4:08 PM

## 2018-03-03 ENCOUNTER — Other Ambulatory Visit: Payer: Medicare Other

## 2018-03-09 ENCOUNTER — Ambulatory Visit: Payer: Medicare Other

## 2018-03-18 DIAGNOSIS — M25511 Pain in right shoulder: Secondary | ICD-10-CM | POA: Diagnosis not present

## 2018-03-20 ENCOUNTER — Other Ambulatory Visit: Payer: Medicare Other

## 2018-03-25 ENCOUNTER — Ambulatory Visit: Payer: Medicare Other

## 2018-03-26 DIAGNOSIS — M25511 Pain in right shoulder: Secondary | ICD-10-CM | POA: Diagnosis not present

## 2018-03-27 DIAGNOSIS — M19011 Primary osteoarthritis, right shoulder: Secondary | ICD-10-CM | POA: Diagnosis not present

## 2018-04-01 ENCOUNTER — Other Ambulatory Visit: Payer: Self-pay | Admitting: Cardiology

## 2018-04-01 DIAGNOSIS — I5043 Acute on chronic combined systolic (congestive) and diastolic (congestive) heart failure: Secondary | ICD-10-CM

## 2018-04-01 DIAGNOSIS — E785 Hyperlipidemia, unspecified: Secondary | ICD-10-CM

## 2018-04-01 DIAGNOSIS — I10 Essential (primary) hypertension: Secondary | ICD-10-CM

## 2018-04-20 ENCOUNTER — Ambulatory Visit
Admission: RE | Admit: 2018-04-20 | Discharge: 2018-04-20 | Disposition: A | Discharge status: Home / Self Care | Payer: Medicare Other | Source: Ambulatory Visit | Attending: Family Medicine | Admitting: Family Medicine

## 2018-04-20 DIAGNOSIS — Z1231 Encounter for screening mammogram for malignant neoplasm of breast: Secondary | ICD-10-CM

## 2018-05-06 ENCOUNTER — Other Ambulatory Visit: Payer: Self-pay | Admitting: Family Medicine

## 2018-05-06 NOTE — Telephone Encounter (Signed)
Refill OK

## 2018-05-06 NOTE — Telephone Encounter (Signed)
Last refill 04/07/17 and last office visit 04/16/17.  No appointments scheduled.  Okay to fill?

## 2018-05-11 DIAGNOSIS — L304 Erythema intertrigo: Secondary | ICD-10-CM | POA: Diagnosis not present

## 2018-05-25 DIAGNOSIS — M19011 Primary osteoarthritis, right shoulder: Secondary | ICD-10-CM | POA: Diagnosis not present

## 2018-05-28 DIAGNOSIS — H401423 Capsular glaucoma with pseudoexfoliation of lens, left eye, severe stage: Secondary | ICD-10-CM | POA: Diagnosis not present

## 2018-05-28 DIAGNOSIS — H2513 Age-related nuclear cataract, bilateral: Secondary | ICD-10-CM | POA: Diagnosis not present

## 2018-05-29 ENCOUNTER — Other Ambulatory Visit: Payer: Self-pay | Admitting: Family Medicine

## 2018-06-24 ENCOUNTER — Other Ambulatory Visit: Payer: Self-pay | Admitting: Family Medicine

## 2018-06-30 ENCOUNTER — Other Ambulatory Visit: Payer: Self-pay | Admitting: Family Medicine

## 2018-07-01 DIAGNOSIS — M19011 Primary osteoarthritis, right shoulder: Secondary | ICD-10-CM | POA: Diagnosis not present

## 2018-07-01 DIAGNOSIS — S46011D Strain of muscle(s) and tendon(s) of the rotator cuff of right shoulder, subsequent encounter: Secondary | ICD-10-CM | POA: Diagnosis not present

## 2018-07-27 ENCOUNTER — Other Ambulatory Visit: Payer: Self-pay | Admitting: *Deleted

## 2018-08-03 ENCOUNTER — Other Ambulatory Visit: Payer: Self-pay | Admitting: Cardiology

## 2018-08-19 DIAGNOSIS — M25552 Pain in left hip: Secondary | ICD-10-CM | POA: Diagnosis not present

## 2018-08-19 DIAGNOSIS — M5136 Other intervertebral disc degeneration, lumbar region: Secondary | ICD-10-CM | POA: Diagnosis not present

## 2018-10-05 DIAGNOSIS — M25552 Pain in left hip: Secondary | ICD-10-CM | POA: Diagnosis not present

## 2018-11-03 DIAGNOSIS — H401423 Capsular glaucoma with pseudoexfoliation of lens, left eye, severe stage: Secondary | ICD-10-CM | POA: Diagnosis not present

## 2018-11-09 DIAGNOSIS — M5431 Sciatica, right side: Secondary | ICD-10-CM | POA: Diagnosis not present

## 2018-11-11 DIAGNOSIS — S82001A Unspecified fracture of right patella, initial encounter for closed fracture: Secondary | ICD-10-CM | POA: Diagnosis not present

## 2018-11-18 DIAGNOSIS — S82001D Unspecified fracture of right patella, subsequent encounter for closed fracture with routine healing: Secondary | ICD-10-CM | POA: Diagnosis not present

## 2018-11-27 DIAGNOSIS — S82001D Unspecified fracture of right patella, subsequent encounter for closed fracture with routine healing: Secondary | ICD-10-CM | POA: Diagnosis not present

## 2018-11-30 ENCOUNTER — Other Ambulatory Visit: Payer: Self-pay | Admitting: Family Medicine

## 2018-12-28 DIAGNOSIS — S82001D Unspecified fracture of right patella, subsequent encounter for closed fracture with routine healing: Secondary | ICD-10-CM | POA: Diagnosis not present

## 2019-01-06 DIAGNOSIS — M25561 Pain in right knee: Secondary | ICD-10-CM | POA: Diagnosis not present

## 2019-01-06 DIAGNOSIS — R260 Ataxic gait: Secondary | ICD-10-CM | POA: Diagnosis not present

## 2019-01-12 DIAGNOSIS — R260 Ataxic gait: Secondary | ICD-10-CM | POA: Diagnosis not present

## 2019-01-12 DIAGNOSIS — M25561 Pain in right knee: Secondary | ICD-10-CM | POA: Diagnosis not present

## 2019-01-19 DIAGNOSIS — M25561 Pain in right knee: Secondary | ICD-10-CM | POA: Diagnosis not present

## 2019-01-19 DIAGNOSIS — R260 Ataxic gait: Secondary | ICD-10-CM | POA: Diagnosis not present

## 2019-01-19 IMAGING — DX DG SHOULDER 2+V PORT*R*
1 series · 1 of 1 positions shown · non-contrast
Comparison: None.

CLINICAL DATA: Followup shoulder replacement

EXAM:
PORTABLE RIGHT SHOULDER

[shoulder ap]
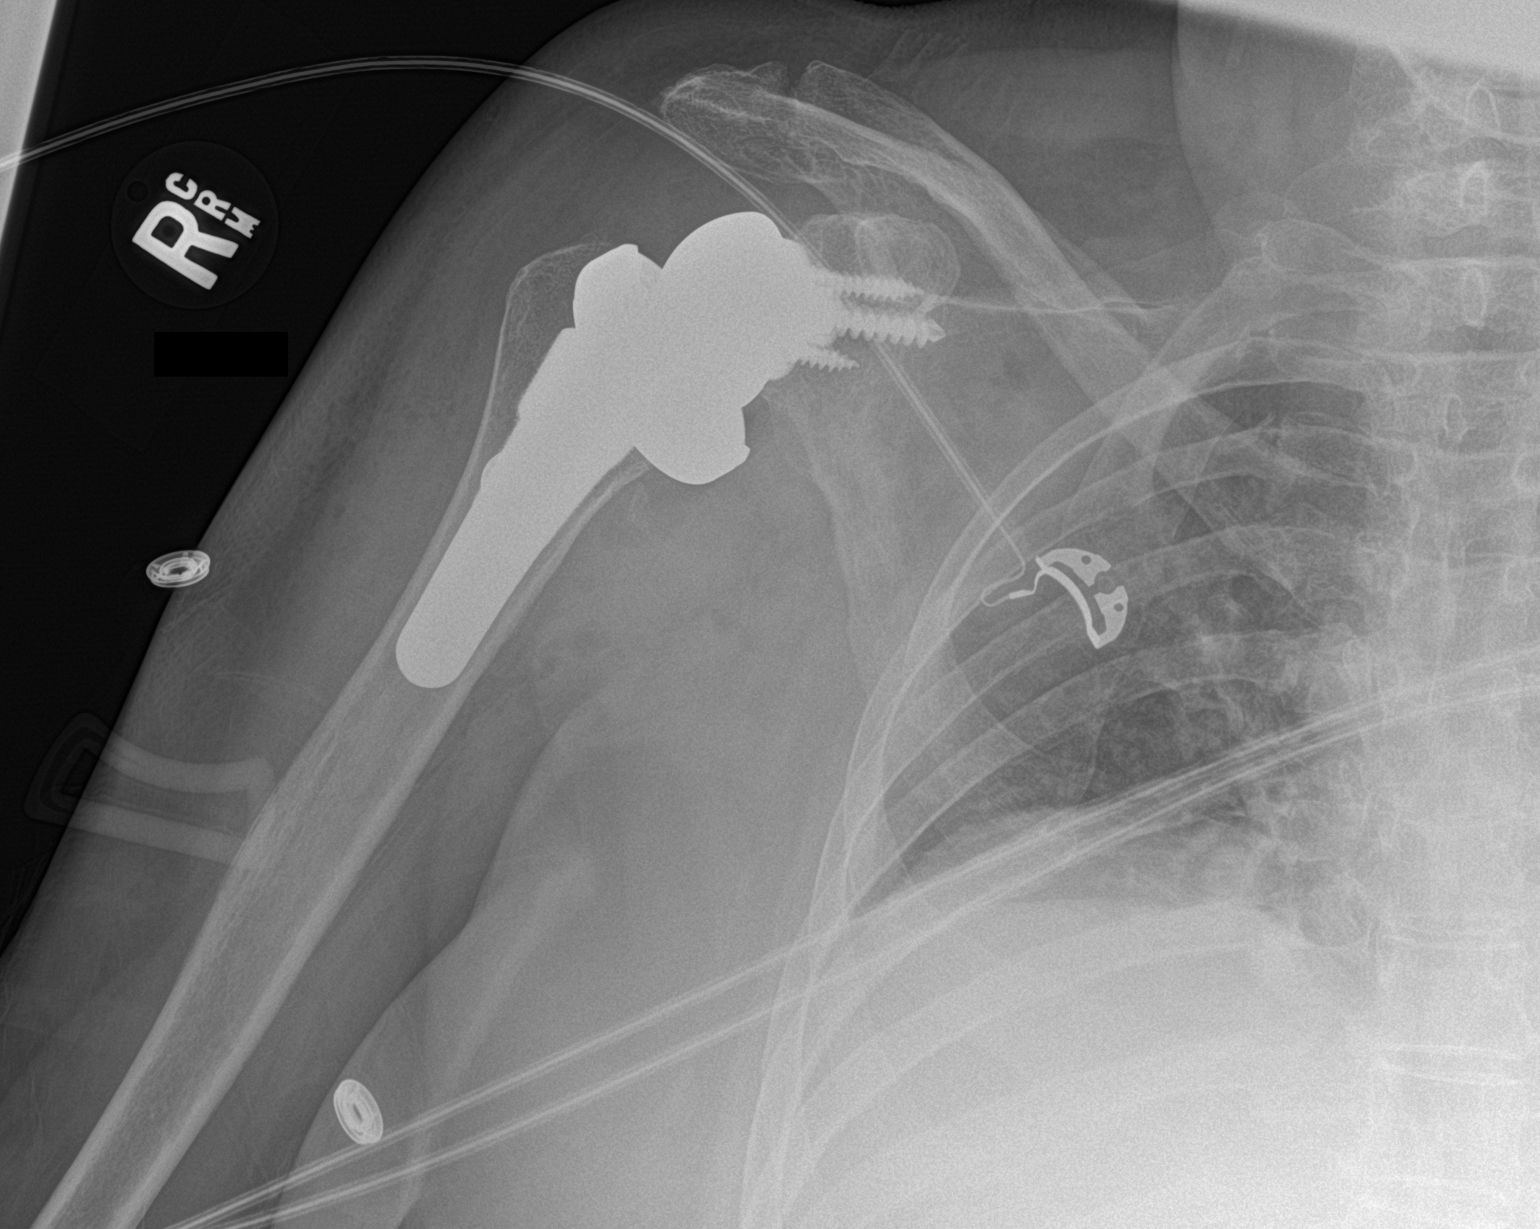

[1 of 1 positions shown; findings below may reference images not displayed]

FINDINGS: Single image shows reverse right shoulder arthroplasty. Components
appear well positioned. No radiographically detectable complication.
IMPRESSION: Right shoulder arthroplasty.

## 2019-01-21 DIAGNOSIS — R260 Ataxic gait: Secondary | ICD-10-CM | POA: Diagnosis not present

## 2019-01-21 DIAGNOSIS — M25561 Pain in right knee: Secondary | ICD-10-CM | POA: Diagnosis not present

## 2019-01-25 DIAGNOSIS — S82001D Unspecified fracture of right patella, subsequent encounter for closed fracture with routine healing: Secondary | ICD-10-CM | POA: Diagnosis not present

## 2019-01-26 DIAGNOSIS — R260 Ataxic gait: Secondary | ICD-10-CM | POA: Diagnosis not present

## 2019-01-26 DIAGNOSIS — M25561 Pain in right knee: Secondary | ICD-10-CM | POA: Diagnosis not present

## 2019-02-03 DIAGNOSIS — M25561 Pain in right knee: Secondary | ICD-10-CM | POA: Diagnosis not present

## 2019-02-03 DIAGNOSIS — R260 Ataxic gait: Secondary | ICD-10-CM | POA: Diagnosis not present

## 2019-02-05 DIAGNOSIS — M25561 Pain in right knee: Secondary | ICD-10-CM | POA: Diagnosis not present

## 2019-02-05 DIAGNOSIS — R260 Ataxic gait: Secondary | ICD-10-CM | POA: Diagnosis not present

## 2019-02-08 ENCOUNTER — Other Ambulatory Visit: Payer: Self-pay | Admitting: Family Medicine

## 2019-02-09 DIAGNOSIS — M25561 Pain in right knee: Secondary | ICD-10-CM | POA: Diagnosis not present

## 2019-02-09 DIAGNOSIS — R260 Ataxic gait: Secondary | ICD-10-CM | POA: Diagnosis not present

## 2019-02-10 ENCOUNTER — Ambulatory Visit: Payer: Medicare Other | Admitting: Physician Assistant

## 2019-02-12 DIAGNOSIS — M25561 Pain in right knee: Secondary | ICD-10-CM | POA: Diagnosis not present

## 2019-02-12 DIAGNOSIS — R260 Ataxic gait: Secondary | ICD-10-CM | POA: Diagnosis not present

## 2019-02-16 ENCOUNTER — Other Ambulatory Visit: Payer: Self-pay | Admitting: Family Medicine

## 2019-02-16 DIAGNOSIS — Z1231 Encounter for screening mammogram for malignant neoplasm of breast: Secondary | ICD-10-CM

## 2019-02-17 ENCOUNTER — Other Ambulatory Visit: Payer: Self-pay

## 2019-02-17 ENCOUNTER — Encounter: Payer: Self-pay | Admitting: Family Medicine

## 2019-02-17 ENCOUNTER — Ambulatory Visit (INDEPENDENT_AMBULATORY_CARE_PROVIDER_SITE_OTHER): Payer: PPO | Admitting: Family Medicine

## 2019-02-17 VITALS — BP 118/68 | HR 77 | Temp 98.0°F | Ht 67.0 in | Wt 172.9 lb

## 2019-02-17 DIAGNOSIS — Z8659 Personal history of other mental and behavioral disorders: Secondary | ICD-10-CM

## 2019-02-17 DIAGNOSIS — E785 Hyperlipidemia, unspecified: Secondary | ICD-10-CM | POA: Diagnosis not present

## 2019-02-17 DIAGNOSIS — Z23 Encounter for immunization: Secondary | ICD-10-CM | POA: Diagnosis not present

## 2019-02-17 DIAGNOSIS — M1A9XX0 Chronic gout, unspecified, without tophus (tophi): Secondary | ICD-10-CM | POA: Diagnosis not present

## 2019-02-17 DIAGNOSIS — I10 Essential (primary) hypertension: Secondary | ICD-10-CM | POA: Diagnosis not present

## 2019-02-17 LAB — LIPID PANEL
Cholesterol: 253 mg/dL — ABNORMAL HIGH (ref 0–200)
HDL: 55.5 mg/dL (ref >39.00)
LDL CALC: 163 mg/dL — AB (ref 0–99)
NONHDL: 197.41
Total CHOL/HDL Ratio: 5
Triglycerides: 171 mg/dL — ABNORMAL HIGH (ref 0.0–149.0)
VLDL: 34.2 mg/dL (ref 0.0–40.0)

## 2019-02-17 LAB — CBC WITH DIFFERENTIAL/PLATELET
BASOS ABS: 0.1 10*3/uL (ref 0.0–0.1)
Basophils Relative: 0.7 % (ref 0.0–3.0)
EOS ABS: 0.3 10*3/uL (ref 0.0–0.7)
Eosinophils Relative: 4.3 % (ref 0.0–5.0)
HEMATOCRIT: 41.7 % (ref 36.0–46.0)
HEMOGLOBIN: 13.8 g/dL (ref 12.0–15.0)
LYMPHS PCT: 22.2 % (ref 12.0–46.0)
Lymphs Abs: 1.7 10*3/uL (ref 0.7–4.0)
MCHC: 33.1 g/dL (ref 30.0–36.0)
MCV: 85.5 fl (ref 78.0–100.0)
MONO ABS: 0.9 10*3/uL (ref 0.1–1.0)
Monocytes Relative: 11.3 % (ref 3.0–12.0)
Neutro Abs: 4.8 10*3/uL (ref 1.4–7.7)
Neutrophils Relative %: 61.5 % (ref 43.0–77.0)
Platelets: 305 10*3/uL (ref 150.0–400.0)
RBC: 4.88 Mil/uL (ref 3.87–5.11)
RDW: 15.3 % (ref 11.5–15.5)
WBC: 7.8 10*3/uL (ref 4.0–10.5)

## 2019-02-17 LAB — COMPREHENSIVE METABOLIC PANEL
ALBUMIN: 4.1 g/dL (ref 3.5–5.2)
ALK PHOS: 76 U/L (ref 39–117)
ALT: 18 U/L (ref 0–35)
AST: 19 U/L (ref 0–37)
BILIRUBIN TOTAL: 0.4 mg/dL (ref 0.2–1.2)
BUN: 16 mg/dL (ref 6–23)
CALCIUM: 8.7 mg/dL (ref 8.4–10.5)
CHLORIDE: 101 meq/L (ref 96–112)
CO2: 29 meq/L (ref 19–32)
Creatinine, Ser: 1.09 mg/dL (ref 0.40–1.20)
GFR: 48.78 mL/min — ABNORMAL LOW (ref >60.00)
Glucose, Bld: 129 mg/dL — ABNORMAL HIGH (ref 70–99)
Potassium: 4.2 mEq/L (ref 3.5–5.1)
SODIUM: 138 meq/L (ref 135–145)
Total Protein: 6.5 g/dL (ref 6.0–8.3)

## 2019-02-17 LAB — URIC ACID: Uric Acid, Serum: 7.9 mg/dL — ABNORMAL HIGH (ref 2.4–7.0)

## 2019-02-17 MED ORDER — FLUOXETINE HCL 20 MG PO CAPS: 40.0000 mg | ORAL_CAPSULE | Freq: Every day | ORAL | 3 refills | Status: DC

## 2019-02-17 MED ORDER — ALLOPURINOL 300 MG PO TABS: 300.0000 mg | ORAL_TABLET | Freq: Every day | ORAL | 3 refills | Status: DC

## 2019-02-17 NOTE — Addendum Note (Signed)
Addended by: Sheffield Slider L on: 02/17/2019 03:00 PM   Modules accepted: Orders

## 2019-02-17 NOTE — Progress Notes (Signed)
Subjective:     Patient ID: Jessica Mcneil, female   DOB: 1941/03/08, 78 y.o.   MRN: [id number]  HPI Patient seen for medical follow-up requesting a couple refills.  She had trip and fall back in November with right patellar fracture.  She is gradually recovering from that.  She has had extensive physical therapy and is ambulating much better.  Has still not had flu vaccine.  Multiple chronic problems including history of dilated cardiomyopathy, combined diastolic and systolic heart failure, hypertension, gout, history of recurrent depression, hyperlipidemia  Denies recent chest pain.  She has follow-up with cardiology soon.  No recent gout flareups.  She takes Prozac 40 mg daily for depression and that is stable.  She has tried tapering off in the past but has had breakthrough depression each time.  Past Medical History:  Diagnosis Date  . Anxiety   . Arthritis   . CHF (congestive heart failure) ([place])    due to non ischemic cardiomyopathy  . Depression   . GERD (gastroesophageal reflux disease)   . Headache(784.0)    migraines  . HLD (hyperlipidemia)   . HTN (hypertension)   . Overweight(278.02)   . Pneumonia   . PONV (postoperative nausea and vomiting)    long time ago none recent  . 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 Shortness of breath    Past Surgical History:  Procedure Laterality Date  . ANTERIOR AND POSTERIOR REPAIR  2003   bladder  . APPENDECTOMY    . BELPHAROPTOSIS REPAIR     bilat  . BREAST BIOPSY Right   . BREAST LUMPECTOMY WITH NEEDLE LOCALIZATION Right 02/12/2013   Procedure: BREAST LUMPECTOMY WITH NEEDLE LOCALIZATION;  Surgeon: Merrie Roof, MD;  Location: [facility];  Service: General;  Laterality: Right;  . CHOLECYSTECTOMY    . COLONOSCOPY    . EYE SURGERY     Glaucoma Implant  . TONSILLECTOMY    . TOTAL SHOULDER ARTHROPLASTY Right 12/09/2017   Procedure: REVERSE TOTAL SHOULDER ARTHROPLASTY;  Surgeon: Marchia Bond,  MD;  Location: [facility];  Service: Orthopedics;  Laterality: Right;  [name] VAGINAL HYSTERECTOMY  1998    reports that she quit smoking about 36 years ago. Her smoking use included cigarettes. She smoked 2.00 packs per day. She has never used smokeless tobacco. She reports that she does not drink alcohol or use drugs. family history includes Arthritis in an other family member; Heart disease in her father; Hyperlipidemia in an other family member. Allergies  Allergen Reactions  . Lipitor [Atorvastatin]     UNSPECIFIED REACTION   . Codeine Itching  . Erythromycin Nausea And Vomiting    GI UPSET  . Sulfamethoxazole Itching    Over 40 years ago     Review of Systems  Constitutional: Negative for fatigue and unexpected weight change.  Eyes: Negative for visual disturbance.  Respiratory: Negative for cough, chest tightness, shortness of breath and wheezing.   Cardiovascular: Negative for chest pain, palpitations and leg swelling.  Gastrointestinal: Negative for abdominal pain.  Genitourinary: Negative for dysuria.  Neurological: Negative for dizziness, seizures, syncope, weakness, light-headedness and headaches.  Psychiatric/Behavioral: Negative for confusion and dysphoric mood.       Objective:   Physical Exam Constitutional:      Appearance: She is well-developed.  Eyes:     Pupils: Pupils are equal, round, and reactive to light.  Neck:     Musculoskeletal: Neck supple.     Thyroid: No thyromegaly.  Vascular: No JVD.  Cardiovascular:     Rate and Rhythm: Normal rate and regular rhythm.     Heart sounds: No gallop.   Pulmonary:     Effort: Pulmonary effort is normal. No respiratory distress.     Breath sounds: No wheezing.     Comments: Some crackles in both bases.  No wheezes. Neurological:     Mental Status: She is alert.        Assessment:     #1 history of recurrent depression currently stable on Prozac  #2 history of gout stable on allopurinol.  No recent  flare-ups  #3 hyperlipidemia treated with pravastatin  #4 history of dilated cardiomyopathy with combined systolic and diastolic heart failure stable.  No recent increased dyspnea, orthopnea, or any weight changes    Plan:     -Check labs with uric acid, comprehensive metabolic panel, CBC, lipid panel -Refilled fluoxetine for 1 year -Flu vaccine given -Continue close follow-up with cardiology  Eulas Post MD [facility]

## 2019-02-18 DIAGNOSIS — R260 Ataxic gait: Secondary | ICD-10-CM | POA: Diagnosis not present

## 2019-02-18 DIAGNOSIS — M25561 Pain in right knee: Secondary | ICD-10-CM | POA: Diagnosis not present

## 2019-02-23 DIAGNOSIS — M25561 Pain in right knee: Secondary | ICD-10-CM | POA: Diagnosis not present

## 2019-02-23 DIAGNOSIS — R260 Ataxic gait: Secondary | ICD-10-CM | POA: Diagnosis not present

## 2019-03-01 DIAGNOSIS — S82001D Unspecified fracture of right patella, subsequent encounter for closed fracture with routine healing: Secondary | ICD-10-CM | POA: Diagnosis not present

## 2019-03-02 DIAGNOSIS — R260 Ataxic gait: Secondary | ICD-10-CM | POA: Diagnosis not present

## 2019-03-02 DIAGNOSIS — M25561 Pain in right knee: Secondary | ICD-10-CM | POA: Diagnosis not present

## 2019-03-04 DIAGNOSIS — R260 Ataxic gait: Secondary | ICD-10-CM | POA: Diagnosis not present

## 2019-03-04 DIAGNOSIS — M25561 Pain in right knee: Secondary | ICD-10-CM | POA: Diagnosis not present

## 2019-03-09 ENCOUNTER — Telehealth: Payer: Self-pay | Admitting: Cardiology

## 2019-03-09 DIAGNOSIS — I10 Essential (primary) hypertension: Secondary | ICD-10-CM

## 2019-03-09 DIAGNOSIS — I5043 Acute on chronic combined systolic (congestive) and diastolic (congestive) heart failure: Secondary | ICD-10-CM

## 2019-03-09 DIAGNOSIS — E785 Hyperlipidemia, unspecified: Secondary | ICD-10-CM

## 2019-03-09 MED ORDER — METOPROLOL SUCCINATE ER 50 MG PO TB24: ORAL_TABLET | ORAL | 1 refills | Status: DC

## 2019-03-09 MED ORDER — SPIRONOLACTONE 25 MG PO TABS: ORAL_TABLET | ORAL | 1 refills | Status: DC

## 2019-03-09 NOTE — Telephone Encounter (Signed)
Spoke with pt and she denies any cardiac issues at this time.  Denies COVID 19 or other viral/respiratory issues.  Advised I will cancel appt and we will contact her to reschedule.  Pt appreciative for call.  Pt did need refills for Metoprolol and Spironolactone.  These have been sent to pharmacy.

## 2019-03-11 ENCOUNTER — Ambulatory Visit: Payer: PPO | Admitting: Physician Assistant

## 2019-03-17 NOTE — Telephone Encounter (Signed)
Recent labs were good. We can make this a level 3 and even potentially push out to 4-5 months if feeling fine Melina Copa PA-C

## 2019-03-29 ENCOUNTER — Other Ambulatory Visit: Payer: Self-pay | Admitting: Family Medicine

## 2019-03-29 MED ORDER — OMEPRAZOLE 20 MG PO CPDR: 20.0000 mg | DELAYED_RELEASE_CAPSULE | Freq: Every day | ORAL | 0 refills | Status: DC

## 2019-04-06 NOTE — Telephone Encounter (Signed)
Virtual Visit Pre-Appointment Phone Call   TELEPHONE CALL NOTE  Jessica Mcneil has been deemed a candidate for a follow-up tele-health visit to limit community exposure during the Covid-19 pandemic. I spoke with the patient via phone to ensure availability of phone/video source, confirm preferred email & phone number, and discuss instructions and expectations.  I reminded Jessica Mcneil to be prepared with any vital sign and/or heart rhythm information that could potentially be obtained via home monitoring, at the time of her visit. I reminded Jessica Mcneil to expect a phone call at the time of her visit if her visit.  Patient agrees to consent below.  Cleon Gustin, RN 04/06/2019 4:33 PM    FULL LENGTH CONSENT FOR TELE-HEALTH VISIT   I hereby voluntarily request, consent and authorize [facility] and its employed or contracted physicians, physician assistants, nurse practitioners or other licensed health care professionals (the Practitioner), to provide me with telemedicine health care services (the "Services") as deemed necessary by the treating Practitioner. I acknowledge and consent to receive the Services by the Practitioner via telemedicine. I understand that the telemedicine visit will involve communicating with the Practitioner through live audiovisual communication technology and the disclosure of certain medical information by electronic transmission. I acknowledge that I have been given the opportunity to request an in-person assessment or other available alternative prior to the telemedicine visit and am voluntarily participating in the telemedicine visit.  I understand that I have the right to withhold or withdraw my consent to the use of telemedicine in the course of my care at any time, without affecting my right to future care or treatment, and that the Practitioner or I may terminate the telemedicine visit at any time. I understand that I have the right to  inspect all information obtained and/or recorded in the course of the telemedicine visit and may receive copies of available information for a reasonable fee.  I understand that some of the potential risks of receiving the Services via telemedicine include:  [name] Delay or interruption in medical evaluation due to technological equipment failure or disruption; . Information transmitted may not be sufficient (e.g. poor resolution of images) to allow for appropriate medical decision making by the Practitioner; and/or  . In rare instances, security protocols could fail, causing a breach of personal health information.  Furthermore, I acknowledge that it is my responsibility to provide information about my medical history, conditions and care that is complete and accurate to the best of my ability. I acknowledge that Practitioner's advice, recommendations, and/or decision may be based on factors not within their control, such as incomplete or inaccurate data provided by me or distortions of diagnostic images or specimens that may result from electronic transmissions. I understand that the practice of medicine is not an exact science and that Practitioner makes no warranties or guarantees regarding treatment outcomes. I acknowledge that I will receive a copy of this consent concurrently upon execution via email to the email address I last provided but may also request a printed copy by calling the office of [place].    I understand that my insurance will be billed for this visit.   I have read or had this consent read to me. . I understand the contents of this consent, which adequately explains the benefits and risks of the Services being provided via telemedicine.  . I have been provided ample opportunity to ask questions regarding this consent and the Services and have had my  questions answered to my satisfaction. . I give my informed consent for the services to be provided through the use of  telemedicine in my medical care  By participating in this telemedicine visit I agree to the above.

## 2019-04-13 NOTE — Progress Notes (Deleted)
{Choose 1 Note Type (Telehealth Visit or Telephone Visit):[phone]}   Evaluation Performed:  Follow-up visit  Date:  04/13/2019   ID:  Jessica Mcneil, DOB 05-02-1941, MRN [id number]  {Patient [phone]::"Home"} {Provider Location:[phone]}  PCP:  Eulas Post, MD  Cardiologist:  No primary care provider on file. *** Electrophysiologist:  None   Chief Complaint:  ***  History of Present Illness:    Jessica Mcneil is a 78 y.o. female with history of nonischemic cardiomyopathy resolved.  Cardiac cath 2007 normal coronary arteries EF 50%, EF 35 to 40% by echo.  Follow-up echo in 2008 EF 40 to 50% with mild diffuse LV hypokinesis and abnormal LV relaxation, most recent echo 2017 EF 45 to 50% mild AI normal RV.  Also has hyperlipidemia-has not tolerated rated higher doses of pravastatin because of muscle aches.  Previously followed by Dr. Haroldine Laws in heart failure clinic but now sees Dr. Meda Coffee.  The patient {does/does not:200015} have symptoms concerning for COVID-19 infection (fever, chills, cough, or new shortness of breath).    Past Medical History:  Diagnosis Date  . Anxiety   . Arthritis   . CHF (congestive heart failure) ([place])    due to non ischemic cardiomyopathy  . Depression   . GERD (gastroesophageal reflux disease)   . Headache(784.0)    migraines  . HLD (hyperlipidemia)   . HTN (hypertension)   . Overweight(278.02)   . Pneumonia   . PONV (postoperative nausea and vomiting)    long time ago none recent  . 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 Shortness of breath    Past Surgical History:  Procedure Laterality Date  . ANTERIOR AND POSTERIOR REPAIR  2003   bladder  . APPENDECTOMY    . BELPHAROPTOSIS REPAIR     bilat  . BREAST BIOPSY Right   . BREAST LUMPECTOMY WITH NEEDLE LOCALIZATION Right 02/12/2013   Procedure: BREAST LUMPECTOMY WITH NEEDLE LOCALIZATION;  Surgeon: Merrie Roof, MD;  Location: [facility];   Service: General;  Laterality: Right;  . CHOLECYSTECTOMY    . COLONOSCOPY    . EYE SURGERY     Glaucoma Implant  . TONSILLECTOMY    . TOTAL SHOULDER ARTHROPLASTY Right 12/09/2017   Procedure: REVERSE TOTAL SHOULDER ARTHROPLASTY;  Surgeon: Marchia Bond, MD;  Location: [facility];  Service: Orthopedics;  Laterality: Right;  [name] VAGINAL HYSTERECTOMY  1998     No outpatient medications have been marked as taking for the 04/14/19 encounter (Appointment) with Imogene Burn, PA-C.     Allergies:   Lipitor [atorvastatin]; Codeine; Erythromycin; and Sulfamethoxazole   Social History   Tobacco Use  . Smoking status: Former Smoker    Packs/day: 2.00    Types: Cigarettes    Last attempt to quit: 12/23/1982    Years since quitting: 36.3  . Smokeless tobacco: Never Used  Substance Use Topics  . Alcohol use: No  . Drug use: No     Family Hx: The patient's family history includes Arthritis in an other family member; Heart disease in her father; Hyperlipidemia in an other family member. There is no history of Breast cancer.  ROS:   Please see the history of present illness.    *** All other systems reviewed and are negative.   Prior CV studies:   The following studies were reviewed today:  2D echo 2017Study Conclusions   - Left ventricle: The cavity size was normal. Wall thickness was   normal. Systolic function was  mildly reduced. The estimated   ejection fraction was in the range of 45% to 50%. Diffuse   hypokinesis. Doppler parameters are consistent with abnormal left   ventricular relaxation (grade 1 diastolic dysfunction). GLS:-18% - Aortic valve: Trileaflet; mildly thickened, mildly calcified   leaflets. There was mild regurgitation.   Impressions:   - Compared to the prior study, there has been no significant   interval change.   Labs/Other Tests and Data Reviewed:    EKG:  An ECG dated 12/04/2017 was personally reviewed today and demonstrated:  Normal sinus rhythm with  incomplete right bundle branch block, no acute change  Recent Labs: 02/17/2019: ALT 18; BUN 16; Creatinine, Ser 1.09; Hemoglobin 13.8; Platelets 305.0; Potassium 4.2; Sodium 138   Recent Lipid Panel Lab Results  Component Value Date/Time   CHOL 253 (H) 02/17/2019 02:43 PM   TRIG 171.0 (H) 02/17/2019 02:43 PM   HDL 55.50 02/17/2019 02:43 PM   CHOLHDL 5 02/17/2019 02:43 PM   LDLCALC 163 (H) 02/17/2019 02:43 PM   LDLDIRECT 216.7 06/05/2011 11:05 AM    Wt Readings from Last 3 Encounters:  02/17/19 172 lb 14.4 oz (78.4 kg)  03/02/18 172 lb (78 kg)  12/04/17 168 lb 1.6 oz (76.2 kg)     Objective:    Vital Signs:  There were no vitals taken for this visit.   {HeartCare Virtual Exam (Optional):[phone]::"VITAL SIGNS:  reviewed"}  ASSESSMENT & PLAN:    1. 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 Chronic combined systolic and diastolic CHF 3. Essential hypertension 4. Hyperlipidemia on Pravachol 20 milligrams daily LDL 163 triglycerides 179 02/17/2019 Dr. Elease Hashimoto try to increase her Pravachol to 40 mg daily  COVID-19 Education: The signs and symptoms of COVID-19 were discussed with the patient and how to seek care for testing (follow up with PCP or arrange E-visit).  ***The importance of social distancing was discussed today.  Time:   Today, I have spent *** minutes with the patient with telehealth technology discussing the above problems.     Medication Adjustments/Labs and Tests Ordered: Current medicines are reviewed at length with the patient today.  Concerns regarding medicines are outlined above.   Tests Ordered: No orders of the defined types were placed in this encounter.   Medication Changes: No orders of the defined types were placed in this encounter.   Disposition:  Follow up {follow up:15908}  Signed, Ermalinda Barrios, PA-C  04/13/2019  2:54 PM    [facility]

## 2019-04-14 ENCOUNTER — Telehealth: Payer: PPO | Admitting: Physician Assistant

## 2019-04-14 DIAGNOSIS — S82001D Unspecified fracture of right patella, subsequent encounter for closed fracture with routine healing: Secondary | ICD-10-CM | POA: Diagnosis not present

## 2019-04-19 DIAGNOSIS — H401423 Capsular glaucoma with pseudoexfoliation of lens, left eye, severe stage: Secondary | ICD-10-CM | POA: Diagnosis not present

## 2019-04-19 NOTE — Progress Notes (Signed)
Virtual Visit via Video Note   This visit type was conducted due to national recommendations for restrictions regarding the COVID-19 Pandemic (e.g. social distancing) in an effort to limit this patient's exposure and mitigate transmission in our community.  Due to her co-morbid illnesses, this patient is at least at moderate risk for complications without adequate follow up.  This format is felt to be most appropriate for this patient at this time.  All issues noted in this document were discussed and addressed.  A limited physical exam was performed with this format.  Please refer to the patient's chart for her consent to telehealth for [facility].   Evaluation Performed:  Follow-up visit  Date:  04/20/2019   ID:  Jessica Mcneil, DOB 24-Apr-1941, MRN [id number]  Patient Location: Home Provider Location: Home  PCP:  Dorothy Spark, MD  Cardiologist:  Ena Dawley, MD  Electrophysiologist:  None   Chief Complaint:  Yearly f/u  History of Present Illness:    Jessica Mcneil is a 78 y.o. female with history of NICM with normal coronary arteries, EF 50%  2007 on cath, LVEF 35-40% on echo. Most recent echo 11/2016 EF 45-50% mild AI, normal RV.Also has HLD.Transitioned care from Dr. Haroldine Laws to Dr. Meda Coffee.  Last saw Dr. Meda Coffee 02/2018 and doing well.  Patient denies chest pain. Has chronic dyspnea on exertion and is trying to get back on her feet after breaking her knee in November. Walks about 1-1/2 miles everyday.  The patient does not have symptoms concerning for COVID-19 infection (fever, chills, cough, or new shortness of breath).    Past Medical History:  Diagnosis Date  . Anxiety   . Arthritis   . CHF (congestive heart failure) ([place])    due to non ischemic cardiomyopathy  . Depression   . GERD (gastroesophageal reflux disease)   . Headache(784.0)    migraines  . HLD (hyperlipidemia)   . HTN (hypertension)   . Overweight(278.02)   . Pneumonia   . PONV  (postoperative nausea and vomiting)    long time ago none recent  . 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 Shortness of breath    Past Surgical History:  Procedure Laterality Date  . ANTERIOR AND POSTERIOR REPAIR  2003   bladder  . APPENDECTOMY    . BELPHAROPTOSIS REPAIR     bilat  . BREAST BIOPSY Right   . BREAST LUMPECTOMY WITH NEEDLE LOCALIZATION Right 02/12/2013   Procedure: BREAST LUMPECTOMY WITH NEEDLE LOCALIZATION;  Surgeon: Merrie Roof, MD;  Location: [facility];  Service: General;  Laterality: Right;  . CHOLECYSTECTOMY    . COLONOSCOPY    . EYE SURGERY     Glaucoma Implant  . TONSILLECTOMY    . TOTAL SHOULDER ARTHROPLASTY Right 12/09/2017   Procedure: REVERSE TOTAL SHOULDER ARTHROPLASTY;  Surgeon: Marchia Bond, MD;  Location: [facility];  Service: Orthopedics;  Laterality: Right;  [name] VAGINAL HYSTERECTOMY  1998     Current Meds  Medication Sig  . allopurinol (ZYLOPRIM) 300 MG tablet Take 1 tablet (300 mg total) by mouth daily.  [name] ALPRAZolam (XANAX) 0.5 MG tablet TAKE 1 TABLET BY MOUTH AT  BEDTIME AS NEEDED FOR  SLEEP/ANXIETY  . aspirin EC 81 MG tablet Take 81 mg by mouth daily.  [name] CALCIUM-MAGNESIUM-ZINC PO Take 1 tablet by mouth daily.  . colchicine 0.6 MG tablet Take 1 tablet (0.6 mg total) by mouth daily. (Patient taking differently: Take 0.6 mg by mouth  daily as needed. )  . docusate sodium (COLACE) 100 MG capsule Take 200 mg by mouth at bedtime.  [name] FLUoxetine (PROZAC) 20 MG capsule Take 2 capsules (40 mg total) by mouth daily.  [name] GARLIC PO Take 1 tablet by mouth 2 (two) times daily.  . hydrochlorothiazide (MICROZIDE) 12.5 MG capsule Take 1 capsule (12.5 mg total) by mouth daily as needed (Swelling).  [name] HYDROcodone-acetaminophen (NORCO) 10-325 MG tablet Take 1-2 tablets by mouth every 6 (six) hours as needed.  . metoprolol succinate (TOPROL-XL) 50 MG 24 hr tablet TAKE 1 TABLET BY MOUTH ONCE DAILY WITH OR IMMEDIATELY  FOLLOWING A MEAL.  [name] Misc  Natural Products (TART CHERRY ADVANCED) CAPS Take 1 capsule by mouth daily. 1200 mg Daily  . Multiple Vitamin (MULTIVITAMIN WITH MINERALS) TABS Take 1 tablet by mouth daily.  . Omega-3 Fatty Acids 1200 MG CAPS Take 1,200 mg by mouth daily.  [name] omeprazole (PRILOSEC) 20 MG capsule Take 1 capsule (20 mg total) by mouth daily.  . pravastatin (PRAVACHOL) 20 MG tablet TAKE 1 TABLET BY MOUTH  EVERY EVENING (Patient taking differently: Take 10 mg by mouth daily. )  . spironolactone (ALDACTONE) 25 MG tablet Take 1/2 tablet (12.5mg  total) by mouth daily     Allergies:   Lipitor [atorvastatin]; Codeine; Erythromycin; and Sulfamethoxazole   Social History   Tobacco Use  . Smoking status: Former Smoker    Packs/day: 2.00    Types: Cigarettes    Last attempt to quit: 12/23/1982    Years since quitting: 36.3  . Smokeless tobacco: Never Used  Substance Use Topics  . Alcohol use: No  . Drug use: No     Family Hx: The patient's family history includes Arthritis in an other family member; Heart disease in her father; Hyperlipidemia in an other family member. There is no history of Breast cancer.  ROS:   Please see the history of present illness.    Review of Systems  Constitution: Negative.  HENT: Negative.   Eyes: Negative.   Cardiovascular: Negative.   Respiratory: Negative.   Hematologic/Lymphatic: Negative.   Musculoskeletal: Negative.  Negative for joint pain.  Gastrointestinal: Negative.   Genitourinary: Negative.   Neurological: Negative.     All other systems reviewed and are negative.   Prior CV studies:   The following studies were reviewed today: Echo 2017Study Conclusions   - Left ventricle: The cavity size was normal. Wall thickness was   normal. Systolic function was mildly reduced. The estimated   ejection fraction was in the range of 45% to 50%. Diffuse   hypokinesis. Doppler parameters are consistent with abnormal left   ventricular relaxation (grade 1 diastolic  dysfunction). GLS:-18% - Aortic valve: Trileaflet; mildly thickened, mildly calcified   leaflets. There was mild regurgitation.   Impressions:   - Compared to the prior study, there has been no significant   interval change.     Labs/Other Tests and Data Reviewed:    EKG:  An ECG dated 12/04/17   was personally reviewed today and demonstrated:  NSR with IRBBB  Recent Labs: 02/17/2019: ALT 18; BUN 16; Creatinine, Ser 1.09; Hemoglobin 13.8; Platelets 305.0; Potassium 4.2; Sodium 138   Recent Lipid Panel Lab Results  Component Value Date/Time   CHOL 253 (H) 02/17/2019 02:43 PM   TRIG 171.0 (H) 02/17/2019 02:43 PM   HDL 55.50 02/17/2019 02:43 PM   CHOLHDL 5 02/17/2019 02:43 PM   LDLCALC 163 (H) 02/17/2019 02:43 PM   LDLDIRECT 216.7 06/05/2011 11:05  AM    Wt Readings from Last 3 Encounters:  04/20/19 178 lb (80.7 kg)  02/17/19 172 lb 14.4 oz (78.4 kg)  03/02/18 172 lb (78 kg)     Objective:    Vital Signs:  BP (!) 124/44 Comment: unable to take at home  Ht 5\' 7"  (1.702 m)   Wt 178 lb (80.7 kg)   BMI 27.88 kg/m    VITAL SIGNS:  reviewed GEN:  no acute distress RESPIRATORY:  normal respiratory effort, symmetric expansion CARDIOVASCULAR:  no peripheral edema  ASSESSMENT & PLAN:    1. History of NICM, last echo 2017 EF 45-50%-requesting echo for f/u. Will schedule in Sept. 2. Chronic systolic CHF-compensated 3. Essential HTN- hasn't been checking it lately. Was 120/44 at PCP 1 week ago 4. HLD-LDL 163 02/17/19 on pravachol managed by PCP- Patient says she wasn't taking it for several months. But back on it. Should have it repeated in 3 months-will have PCP draw.  COVID-19 Education: The signs and symptoms of COVID-19 were discussed with the patient and how to seek care for testing (follow up with PCP or arrange E-visit).   The importance of social distancing was discussed today.  Time:   Today, I have spent 12 minutes discussing the above problems.     Medication  Adjustments/Labs and Tests Ordered: Current medicines are reviewed at length with the patient today.  Concerns regarding medicines are outlined above.   Tests Ordered: Orders Placed This Encounter  Procedures  . ECHOCARDIOGRAM COMPLETE    Medication Changes: No orders of the defined types were placed in this encounter.   Disposition:  Follow up in 1 year(s) Dr. Meda Coffee  Signed, Ermalinda Barrios, PA-C  04/20/2019 2:02 PM    [place]

## 2019-04-20 ENCOUNTER — Telehealth (INDEPENDENT_AMBULATORY_CARE_PROVIDER_SITE_OTHER): Payer: PPO | Admitting: Physician Assistant

## 2019-04-20 ENCOUNTER — Encounter: Payer: Self-pay | Admitting: Physician Assistant

## 2019-04-20 ENCOUNTER — Other Ambulatory Visit: Payer: Self-pay

## 2019-04-20 VITALS — BP 124/44 | Ht 67.0 in | Wt 178.0 lb

## 2019-04-20 DIAGNOSIS — E785 Hyperlipidemia, unspecified: Secondary | ICD-10-CM

## 2019-04-20 DIAGNOSIS — I428 Other cardiomyopathies: Secondary | ICD-10-CM | POA: Diagnosis not present

## 2019-04-20 DIAGNOSIS — I10 Essential (primary) hypertension: Secondary | ICD-10-CM

## 2019-04-20 DIAGNOSIS — I5022 Chronic systolic (congestive) heart failure: Secondary | ICD-10-CM

## 2019-04-20 NOTE — Patient Instructions (Addendum)
Medication Instructions:  Your physician recommends that you continue on your current medications as directed. Please refer to the Current Medication list given to you today.  If you need a refill on your cardiac medications before your next appointment, please call your pharmacy.   Lab work: None Ordered  If you have labs (blood work) drawn today and your tests are completely normal, you will receive your results only by: [name] MyChart Message (if you have MyChart) OR . A paper copy in the mail If you have any lab test that is abnormal or we need to change your treatment, we will call you to review the results.  Testing/Procedures: Your physician has requested that you have an echocardiogram on 08/31/19 at 8:30 AM. Please arrive at 8:00 AM. Echocardiography is a painless test that uses sound waves to create images of your heart. It provides your doctor with information about the size and shape of your heart and how well your heart's chambers and valves are working. This procedure takes approximately one hour. There are no restrictions for this procedure.  Follow-Up: At [facility], you and your health needs are our priority.  As part of our continuing mission to provide you with exceptional heart care, we have created designated Provider Care Teams.  These Care Teams include your primary Cardiologist (physician) and Advanced Practice Providers (APPs -  Physician Assistants and Nurse Practitioners) who all work together to provide you with the care you need, when you need it. . You will need a follow up appointment in 1 year.  Please call our office 2 months in advance to schedule this appointment.  You may see Ena Dawley, MD or one of the following Advanced Practice Providers on your designated Care Team:   . Lyda Jester, PA-C . Dayna Dunn, PA-C . Ermalinda Barrios, PA-C  Any Other Special Instructions Will Be Listed Below (If Applicable).

## 2019-04-23 ENCOUNTER — Ambulatory Visit: Payer: Self-pay

## 2019-05-27 DIAGNOSIS — S82001S Unspecified fracture of right patella, sequela: Secondary | ICD-10-CM | POA: Diagnosis not present

## 2019-05-27 DIAGNOSIS — S82001 Unspecified fracture of right patella: Secondary | ICD-10-CM | POA: Diagnosis not present

## 2019-06-18 ENCOUNTER — Ambulatory Visit
Admission: RE | Admit: 2019-06-18 | Discharge: 2019-06-18 | Disposition: A | Discharge status: Home / Self Care | Payer: PPO | Source: Ambulatory Visit | Attending: Family Medicine | Admitting: Family Medicine

## 2019-06-18 ENCOUNTER — Other Ambulatory Visit: Payer: Self-pay

## 2019-06-18 DIAGNOSIS — Z1231 Encounter for screening mammogram for malignant neoplasm of breast: Secondary | ICD-10-CM | POA: Diagnosis not present

## 2019-06-30 DIAGNOSIS — S82001D Unspecified fracture of right patella, subsequent encounter for closed fracture with routine healing: Secondary | ICD-10-CM | POA: Diagnosis not present

## 2019-07-01 ENCOUNTER — Other Ambulatory Visit: Payer: Self-pay | Admitting: Family Medicine

## 2019-07-19 DIAGNOSIS — H524 Presbyopia: Secondary | ICD-10-CM | POA: Diagnosis not present

## 2019-07-19 DIAGNOSIS — H401423 Capsular glaucoma with pseudoexfoliation of lens, left eye, severe stage: Secondary | ICD-10-CM | POA: Diagnosis not present

## 2019-07-26 ENCOUNTER — Telehealth: Payer: Self-pay

## 2019-07-26 NOTE — Telephone Encounter (Signed)
Called and spoke to [place] to have [facility] call back to schedule a Doxy/virtual visit with Dr. Elease Hashimoto to go over bone density test.  CRM Created.

## 2019-08-24 ENCOUNTER — Telehealth: Payer: Self-pay

## 2019-08-24 NOTE — Telephone Encounter (Signed)
Called patient again and left a detailed voice message on her cell number listed to have her call back to schedule a Doxy virtual visit/or telephone visit with Dr. Elease Hashimoto to go over bone density scan and a plan of care for her.  OK for [facility] to discuss/advise/schedule patient  CRM Created.

## 2019-08-31 ENCOUNTER — Other Ambulatory Visit (HOSPITAL_COMMUNITY): Payer: PPO

## 2019-09-03 ENCOUNTER — Encounter: Payer: Self-pay | Admitting: Family Medicine

## 2019-09-03 ENCOUNTER — Ambulatory Visit (INDEPENDENT_AMBULATORY_CARE_PROVIDER_SITE_OTHER): Payer: PPO | Admitting: Family Medicine

## 2019-09-03 VITALS — BP 114/70 | HR 78 | Temp 97.5°F | Wt 169.5 lb

## 2019-09-03 DIAGNOSIS — Z23 Encounter for immunization: Secondary | ICD-10-CM | POA: Diagnosis not present

## 2019-09-03 DIAGNOSIS — E785 Hyperlipidemia, unspecified: Secondary | ICD-10-CM | POA: Diagnosis not present

## 2019-09-03 DIAGNOSIS — M810 Age-related osteoporosis without current pathological fracture: Secondary | ICD-10-CM

## 2019-09-03 DIAGNOSIS — I10 Essential (primary) hypertension: Secondary | ICD-10-CM

## 2019-09-03 NOTE — Patient Instructions (Signed)
Set up bone density scan.

## 2019-09-03 NOTE — Addendum Note (Signed)
Addended by: Westley Hummer B on: 09/03/2019 04:38 PM   Modules accepted: Orders

## 2019-09-03 NOTE — Progress Notes (Signed)
Subjective:     Patient ID: Jessica Mcneil, female   DOB: 01-02-1941, 78 y.o.   MRN: [id number]  HPI Patient has history of nonischemic cardiomyopathy, hypertension, history of migraine headaches, hyperlipidemia, history of recurrent depression, gout, and reported history of osteoporosis.  She apparently had nurse from her insurance company come recently for health screening and they did some type of bone density screening with her wrist it was told to follow-up here.  She states that she was on Fosamax for a few years but this was possibly as much as 10 years ago.  She does not recall last bone density scan.  Last one we have on record was 15 years ago.  She did have a fall last November with patellar fracture and that has healed without difficulty.  She takes over-the-counter vitamin D and calcium.  Denies any other fractures.  She is on pravastatin for hyperlipidemia.  She remains on Aldactone and metoprolol for hypertension.  Blood pressures been stable.  She has some chronic dyspnea which is unchanged.  No recent chest pains.  Past Medical History:  Diagnosis Date  . Anxiety   . Arthritis   . CHF (congestive heart failure) ([place])    due to non ischemic cardiomyopathy  . Depression   . GERD (gastroesophageal reflux disease)   . Headache(784.0)    migraines  . HLD (hyperlipidemia)   . HTN (hypertension)   . Overweight(278.02)   . Pneumonia   . PONV (postoperative nausea and vomiting)    long time ago none recent  . 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 Shortness of breath    Past Surgical History:  Procedure Laterality Date  . ANTERIOR AND POSTERIOR REPAIR  2003   bladder  . APPENDECTOMY    . BELPHAROPTOSIS REPAIR     bilat  . BREAST BIOPSY Right   . BREAST LUMPECTOMY WITH NEEDLE LOCALIZATION Right 02/12/2013   Procedure: BREAST LUMPECTOMY WITH NEEDLE LOCALIZATION;  Surgeon: Merrie Roof, MD;  Location: [facility];  Service: General;  Laterality:  Right;  . CHOLECYSTECTOMY    . COLONOSCOPY    . EYE SURGERY     Glaucoma Implant  . TONSILLECTOMY    . TOTAL SHOULDER ARTHROPLASTY Right 12/09/2017   Procedure: REVERSE TOTAL SHOULDER ARTHROPLASTY;  Surgeon: Marchia Bond, MD;  Location: [facility];  Service: Orthopedics;  Laterality: Right;  [name] VAGINAL HYSTERECTOMY  1998    reports that she quit smoking about 36 years ago. Her smoking use included cigarettes. She smoked 2.00 packs per day. She has never used smokeless tobacco. She reports that she does not drink alcohol or use drugs. family history includes Arthritis in an other family member; Heart disease in her father; Hyperlipidemia in an other family member. Allergies  Allergen Reactions  . Lipitor [Atorvastatin]     UNSPECIFIED REACTION   . Codeine Itching  . Erythromycin Nausea And Vomiting    GI UPSET  . Sulfamethoxazole Itching    Over 40 years ago     Review of Systems  Constitutional: Negative for fatigue.  Eyes: Negative for visual disturbance.  Respiratory: Negative for cough, chest tightness, shortness of breath and wheezing.   Cardiovascular: Negative for chest pain, palpitations and leg swelling.  Neurological: Negative for dizziness, seizures, syncope, weakness, light-headedness and headaches.       Objective:   Physical Exam Constitutional:      Appearance: She is well-developed.  Eyes:     Pupils: Pupils are equal,  round, and reactive to light.  Neck:     Musculoskeletal: Neck supple.     Thyroid: No thyromegaly.     Vascular: No JVD.  Cardiovascular:     Rate and Rhythm: Normal rate and regular rhythm.     Heart sounds: No gallop.   Pulmonary:     Effort: Pulmonary effort is normal. No respiratory distress.     Breath sounds: Normal breath sounds. No wheezing or rales.  Neurological:     Mental Status: She is alert.        Assessment:     #1 postmenopausal with reported history of osteoporosis.  She has not had any recent DEXA scanning  #2  hypertension stable and at goal  #3 hyperlipidemia.  Lipids were up last check 6 months ago but inconsistent use of pravastatin then.  She is nonfasting today    Plan:     -Set up DEXA scan to further evaluate -Continue regular weightbearing exercise such as walking -Continue daily calcium and vitamin D - we will need to get follow-up fasting lipids soon  Eulas Post MD [facility]

## 2019-09-06 ENCOUNTER — Encounter (HOSPITAL_COMMUNITY): Payer: Self-pay | Admitting: Physician Assistant

## 2019-09-08 ENCOUNTER — Other Ambulatory Visit: Payer: Self-pay

## 2019-09-08 ENCOUNTER — Ambulatory Visit (INDEPENDENT_AMBULATORY_CARE_PROVIDER_SITE_OTHER)
Admission: RE | Admit: 2019-09-08 | Discharge: 2019-09-08 | Disposition: A | Discharge status: Home / Self Care | Payer: PPO | Source: Ambulatory Visit | Attending: Family Medicine | Admitting: Family Medicine

## 2019-09-08 DIAGNOSIS — M810 Age-related osteoporosis without current pathological fracture: Secondary | ICD-10-CM | POA: Diagnosis not present

## 2019-09-15 ENCOUNTER — Other Ambulatory Visit: Payer: Self-pay | Admitting: Family Medicine

## 2019-09-15 MED ORDER — ALENDRONATE SODIUM 70 MG PO TABS: 70.0000 mg | ORAL_TABLET | ORAL | 3 refills | Status: DC

## 2019-09-23 ENCOUNTER — Other Ambulatory Visit (HOSPITAL_COMMUNITY): Payer: PPO

## 2019-09-26 ENCOUNTER — Other Ambulatory Visit: Payer: Self-pay | Admitting: Family Medicine

## 2019-09-29 ENCOUNTER — Ambulatory Visit (HOSPITAL_COMMUNITY): Payer: PPO | Attending: Cardiovascular Disease

## 2019-09-29 ENCOUNTER — Other Ambulatory Visit: Payer: Self-pay

## 2019-09-29 DIAGNOSIS — I428 Other cardiomyopathies: Secondary | ICD-10-CM | POA: Diagnosis not present

## 2019-10-11 ENCOUNTER — Other Ambulatory Visit: Payer: Self-pay | Admitting: Cardiology

## 2019-10-16 ENCOUNTER — Other Ambulatory Visit: Payer: Self-pay | Admitting: Family Medicine

## 2019-11-24 DIAGNOSIS — E785 Hyperlipidemia, unspecified: Secondary | ICD-10-CM | POA: Diagnosis not present

## 2019-11-24 DIAGNOSIS — I10 Essential (primary) hypertension: Secondary | ICD-10-CM | POA: Diagnosis not present

## 2019-11-24 LAB — CBC AND DIFFERENTIAL
HCT: 41 (ref 36–46)
Hemoglobin: 13.2 (ref 12.0–16.0)
Platelets: 357 (ref 150–399)
WBC: 7.3

## 2019-11-24 LAB — LIPID PANEL
Cholesterol: 236 — AB (ref 0–200)
HDL: 62 (ref 35–70)
LDL Cholesterol: 160
Triglycerides: 80 (ref 40–160)

## 2019-11-24 LAB — CBC: RBC: 4.53 (ref 3.87–5.11)

## 2019-12-05 ENCOUNTER — Other Ambulatory Visit: Payer: Self-pay | Admitting: Family Medicine

## 2020-01-30 ENCOUNTER — Ambulatory Visit: Payer: PPO | Attending: Internal Medicine

## 2020-01-30 DIAGNOSIS — Z23 Encounter for immunization: Secondary | ICD-10-CM | POA: Insufficient documentation

## 2020-01-30 NOTE — Progress Notes (Signed)
   Covid-19 Vaccination Clinic  Name:  Jessica Mcneil    MRN: [id number] DOB: 04-Aug-1941  01/30/2020  [name] was observed post Covid-19 immunization for 15 minutes without incidence. She was provided with Vaccine Information Sheet and instruction to access the V-Safe system.   [name] was instructed to call 911 with any severe reactions post vaccine: [name] Difficulty breathing  . Swelling of your face and throat  . A fast heartbeat  . A bad rash all over your body  . Dizziness and weakness    Immunizations Administered    Name Date Dose VIS Date Route   Pfizer COVID-19 Vaccine 01/30/2020  3:43 PM 0.3 mL 12/03/2019 Intramuscular   Manufacturer: [place]   Lot: [id number]   [place]: [id number]

## 2020-02-03 DIAGNOSIS — H401432 Capsular glaucoma with pseudoexfoliation of lens, bilateral, moderate stage: Secondary | ICD-10-CM | POA: Diagnosis not present

## 2020-02-03 DIAGNOSIS — H04123 Dry eye syndrome of bilateral lacrimal glands: Secondary | ICD-10-CM | POA: Diagnosis not present

## 2020-02-21 ENCOUNTER — Telehealth: Payer: Self-pay | Admitting: Family Medicine

## 2020-02-21 NOTE — Chronic Care Management (AMB) (Signed)
  Chronic Care Management   Note  02/21/2020 Name: SAORY CARRIERO MRN: [id number] DOB: 09-Feb-1941  Elberta Fortis Grigoryan is a 79 y.o. year old female who is a primary care patient of Burchette, Alinda Sierras, MD. I reached out to [name] by phone today in response to a referral sent by Ms. Elberta Fortis Cirilo's PCP, Eulas Post, MD.   Ms. Dismore was given information about Chronic Care Management services today including:  1. CCM service includes personalized support from designated clinical staff supervised by her physician, including individualized plan of care and coordination with other care providers 2. 24/7 contact phone numbers for assistance for urgent and routine care needs. 3. Service will only be billed when office clinical staff spend 20 minutes or more in a month to coordinate care. 4. Only one practitioner may furnish and bill the service in a calendar month. 5. The patient may stop CCM services at any time (effective at the end of the month) by phone call to the office staff. 6. The patient will be responsible for cost sharing (co-pay) of up to 20% of the service fee (after annual deductible is met).  Patient agreed to services and verbal consent obtained.   Follow up plan:   Raynicia Dukes [employer] Scheduler

## 2020-02-24 ENCOUNTER — Ambulatory Visit: Payer: PPO | Attending: Internal Medicine

## 2020-02-24 DIAGNOSIS — Z23 Encounter for immunization: Secondary | ICD-10-CM | POA: Insufficient documentation

## 2020-02-24 NOTE — Progress Notes (Signed)
   Covid-19 Vaccination Clinic  Name:  Jessica Mcneil    MRN: [id number] DOB: [birth year]  02/24/2020  [name] was observed post Covid-19 immunization for 15 minutes without incident. She was provided with Vaccine Information Sheet and instruction to access the V-Safe system.   [name] was instructed to call 911 with any severe reactions post vaccine: [name] Difficulty breathing  . Swelling of face and throat  . A fast heartbeat  . A bad rash all over body  . Dizziness and weakness   Immunizations Administered    Name Date Dose VIS Date Route   Pfizer COVID-19 Vaccine 02/24/2020 10:39 AM 0.3 mL 12/03/2019 Intramuscular   Manufacturer: [place]   Lot: [id number]   [place]: [id number]

## 2020-02-29 ENCOUNTER — Other Ambulatory Visit: Payer: Self-pay

## 2020-02-29 ENCOUNTER — Telehealth: Payer: Self-pay

## 2020-02-29 ENCOUNTER — Other Ambulatory Visit: Payer: Self-pay | Admitting: Family Medicine

## 2020-02-29 DIAGNOSIS — I10 Essential (primary) hypertension: Secondary | ICD-10-CM

## 2020-02-29 DIAGNOSIS — M810 Age-related osteoporosis without current pathological fracture: Secondary | ICD-10-CM

## 2020-02-29 NOTE — Telephone Encounter (Signed)
CCM referral has been ordered.

## 2020-02-29 NOTE — Telephone Encounter (Signed)
Okay to refer? 

## 2020-02-29 NOTE — Telephone Encounter (Signed)
I am requesting an ambulatory referral to CCM be placed for this patient. Referral will need to have 2 current diagnosis attached to it. Thank you!  

## 2020-03-02 NOTE — Chronic Care Management (AMB) (Signed)
Chronic Care Management Pharmacy  Name: Jessica Mcneil  MRN: [id number] DOB: [birth year]  Initial Questions: 1. Have you seen any other providers since your last visit? NA 2. Any changes in your medicines or health? No   Chief Complaint/ HPI Jessica Mcneil,  79 y.o. , female presents for their Initial CCM visit with the clinical pharmacist via telephone due to COVID-19 Pandemic.  PCP : Eulas Post, MD  Their chronic conditions include: Nonischemic cardiomyopathy, Systolic HF, Migraine, HTN, Constipation, GERD, Gout, HLD  Office Visits: 09/03/2019- Patient presented for office visit with Dr. Carolann Littler, MD. Patient has not had recent DEXA scan. Patient to obtain for further evaluation. Patient to continue with weightbearing exercises and daily calcium and vitamin D. Patient to obtain fasting lipid levels.   Consult Visit: 07/19/19- Ophthalmology- Patient presented to Dr. [name].   06/30/19- Orthopedic surgery- Patient presented to Dr. Marchia Bond.   04/20/19- Cardiology- Patient presented for virtual visit with Ermalinda Barrios, PA. Last ECHO was in 2017 (EF 45-50%). Patient to schedule follow-up ECHO. HLD managed by PCP. Patient to obtain repeat blood draw. Recent LDL: 163 (02/17/19). Patient stated she was not taking statin for several months. Patient to obtain repeat lipid panel in 3 months.   Medications: Outpatient Encounter Medications as of 03/03/2020  Medication Sig  . acetaminophen (TYLENOL) 500 MG tablet Take 500 mg by mouth. Patient reports taking 1 to 2 tablets a day if needed.  [name] alendronate (FOSAMAX) 70 MG tablet Take 1 tablet (70 mg total) by mouth every 7 (seven) days. Take with a full glass of water on an empty stomach.  [name] allopurinol (ZYLOPRIM) 300 MG tablet TAKE 1 TABLET BY MOUTH EVERY DAY  . ALPRAZolam (XANAX) 0.5 MG tablet TAKE 1 TABLET BY MOUTH AT  BEDTIME AS NEEDED FOR  SLEEP/ANXIETY  . aspirin EC 81 MG tablet Take 81 mg by mouth daily.  [name]  CALCIUM-MAGNESIUM-ZINC PO Take 1 tablet by mouth daily.  . colchicine 0.6 MG tablet Take 1 tablet (0.6 mg total) by mouth daily. (Patient taking differently: Take 0.6 mg by mouth daily as needed. )  . FLUoxetine (PROZAC) 20 MG capsule TAKE 2 CAPSULES BY MOUTH EVERY DAY  . hydrochlorothiazide (MICROZIDE) 12.5 MG capsule Take 1 capsule (12.5 mg total) by mouth daily as needed (Swelling).  [name] HYDROcodone-acetaminophen (NORCO) 10-325 MG tablet Take 1-2 tablets by mouth every 6 (six) hours as needed.  . metoprolol succinate (TOPROL-XL) 50 MG 24 hr tablet TAKE 1 TABLET BY MOUTH ONCE DAILY WITH OR IMMEDIATELY FOLLOWING A MEAL.  . Multiple Vitamin (MULTIVITAMIN WITH MINERALS) TABS Take 1 tablet by mouth daily.  . Omega-3 Fatty Acids 1200 MG CAPS Take 1,200 mg by mouth daily.  [name] omeprazole (PRILOSEC) 20 MG capsule TAKE 1 CAPSULE BY MOUTH EVERY DAY  . pravastatin (PRAVACHOL) 20 MG tablet TAKE 1 TABLET BY MOUTH  EVERY EVENING (Patient taking differently: Take 10 mg by mouth daily. )  . Probiotic Product (PROBIOTIC ADVANCED PO) Take by mouth daily.  [name] spironolactone (ALDACTONE) 25 MG tablet TAKE 1/2 TABLET (12.5MG  TOTAL) BY MOUTH DAILY  . traMADol (ULTRAM) 50 MG tablet Take 50 mg by mouth every 6 (six) hours as needed for moderate pain.  [name] docusate sodium (COLACE) 100 MG capsule Take 200 mg by mouth at bedtime.  [name] GARLIC PO Take 1 tablet by mouth 2 (two) times daily.  . Misc Natural Products (TART CHERRY ADVANCED) CAPS Take 1 capsule by mouth daily. 1200 mg Daily  No facility-administered encounter medications on file as of 03/03/2020.    Current Diagnosis/Assessment:  Goals Addressed            This Visit's Progress   . Pharmacy Care Plan       CARE PLAN ENTRY  Current Barriers:  . Chronic Disease Management support, education, and care coordination needs related to CHF, HTN, HLD, and Nonischemic cardiomyopathy, Migraine, constipation, GERD, Gout   Pharmacist Clinical Goal(s):  . Heart  failure: Check weight daily and contact physician if weight gain of more than 3 pounds overnight or more than 5 pounds in a week. . Blood pressure:   [name] Maintain Blood pressure <130/80 mmHg  . High cholesterol:  . Cholesterol goals: Total Cholesterol goal under 200, Triglycerides goal under 150, HDL goal above 40 (men) or above 50 (women), LDL goal under 100.  . Osteoporosis . Prevent bone fractures and ensure daily intake of calcium and vitamin D.  . Anxiety: Continue to see improvement in anxiety symptoms . Migraine: Continue to see improvement in pain level.  . Constipation: Continue to see improvement in bowel movements.  . Heart burn: Minimize reflux symptoms.   Interventions: . Comprehensive medication review performed. . Blood pressure:  . Discussed need to continue checking blood pressure at home.  . Discussed diet modifications. DASH diet:  following a diet emphasizing fruits and vegetables and low-fat dairy products along with whole grains, fish, poultry, and nuts. Reducing red meats and sugars.  . Reducing the amount of salt intake to 1500mg /per day.  . Recommend using a salt substitute to replace your salt if you need flavor.      . Weight reduction- We discussed losing 5-10% of body weight . Continue: metoprolol succinate 50mg , 1 tablet once daily with or immediately following a meal and spironolactone 25mg , 0.5 tablet daily  . High cholesterol . How to reduce cholesterol through diet/weight management and physical activity.    . We discussed how a diet high in plant sterols (fruits/vegetables/nuts/whole grains/legumes) may reduce your cholesterol.  Encouraged increasing fiber to a daily intake of 10-25g/day  . Continue: pravastatin 10mg  every evening and omega-3 fatty acids 1200mg , 1 capsule daily.  . Osteoporosis . Discussed importance of adherence.  . Continue: alendronate 70mg , 1 tablet every 7 days.  . Gout . Eating a low-purine diet. Avoid foods and drinks such as:  liver, kidney, anchovies, asparagus, herring, mushrooms, mussels, beer,etc. . Continue: allopurinol 300mg , 1 tablet daily and colchicine 0.6mg , 1 tablet daily as needed . Anxiety . Continue fluoxetine 20mg , 2 capsules daily and alprazolam 0.5mg , 1 tablet at bedtime as needed for sleep/anxiety.  . Migraine  . Continue: Tylenol 500mg , takes 1 to 2 tablets a day if needed and Tramadol 50 mg every 6 hours for pain  . Constipation . Continue: daily intake of probiotic.  [name] GERD: . Discussed non-pharmacological interventions for acid reflux. Take measures to prevent acid reflux, such as avoiding spicy foods, avoiding caffeine, avoid laying down a few hours after eating, and raising the head of the bed. . Continue: omeprazole 20mg , 1 capsule daily  Patient Self Care Activities:  . Self administers medications as prescribed and Calls provider office for new concerns or questions . Continue current medications as directed by providers.  . Continue following up with specialists. . Continue at home blood pressure readings. . Continue working on health habits (diet/ exercise).  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   Patient has failed these meds in past: lisinopril, losartan Patient is currently controlled on the following medications:  - metoprolol succinate 50mg , 1 tablet once daily - spironolactone 25mg , 0.5 tablet daily - HCTZ 12.5mg , 1 capsule once daily as needed for swelling (patient stated she has not taken in a long time; unable to provide timeframe).   We discussed weighing daily; if you gain more than 3 pounds in one day or 5 pounds in one week call your doctor.  - patient notes weighting daily   Plan Managed by cardiologist.  Continue current medications.  ,  Hypertension   BP today is:   <130/80  Office blood pressures are  BP Readings from Last 3 Encounters:  03/07/20 110/62  09/03/19 114/70  04/20/19 (!) 124/44   Patient has failed these meds in the past: lisinopril, losartan  Patient checks BP at home 1-2x per week  Patient home BP readings are ranging: 116/92 mmHg   Patient is controlled on:  - metoprolol succinate 50mg , 1 tablet once daily with or immediately following a meal  - spironolactone 25mg , 0.5 tablet daily   We discussed diet and exercise extensively . DASH diet:  following a diet emphasizing fruits and vegetables and low-fat dairy products along with whole grains, fish, poultry, and nuts. Reducing red meats and sugars.  . Reducing the amount of salt intake to 1500mg /per day.  . Recommend using a salt substitute to replace your salt if you need flavor.    . Getting enough potassium in your diet equaling 3500-5000mg /day.  This helps to regulate BP by balancing out the effects of salt.   . Weight reduction- We discussed losing 5-10% of body weight.    Plan Denies dizziness/lightheadedness/ orthostatic hypotension.  Managed by cardiologist.  Continue current medications and control with diet and exercise   Hyperlipidemia  Lipid Panel     Component Value Date/Time   CHOL 236 (A) 11/24/2019 0000   TRIG 80 11/24/2019 0000   HDL 62 11/24/2019 0000   CHOLHDL 5 02/17/2019 1443   VLDL 34.2 02/17/2019 1443   LDLCALC 160 11/24/2019 0000   LDLDIRECT 216.7 06/05/2011 1105    The 10-year ASCVD risk score Mikey Bussing DC Jr., et al., 2013) is: 19.9%* (Cholesterol units were assumed)  Patient has failed these meds in past: atorvastatin  Patient is currently uncontrolled on the following medications:  - pravastatin 20mg  tablet, (patient reports taking 0.5 tablets (10mg )tablet every evening)  - omega-3 fatty acids 1200mg , 1 capsule daily (has not been taking regulary).   We discussed:  diet and exercise extensively . How to reduce cholesterol through  diet/weight management and physical activity.    . We discussed how a diet high in plant sterols (fruits/vegetables/nuts/whole grains/legumes) may reduce your cholesterol.  Encouraged increasing fiber to a daily intake of 10-25g/day.    Plan Patient states she was instructed to take 40mg  daily, but can not tolerate.  Managed by cardiologist.  Continue current medications  Osteoporosis  Patient has failed these meds in past: none Patient is currently managed on the following medications:  - alendronate 70mg , 1 tablet every 7 days.   We discussed:  calcium intake from diet and reviewed alendronate administration.   Plan Patient stated she has not restarted alendronate. Aware of risks vs benefits. Patient to start taking alendronate again.  Continue current medications  Gout  Patient has failed these meds  in past: none  Patient is currently controlled on the following medications:  - allopurinol 300mg , 1 tablet daily - colchicine 0.6mg , 1 tablet daily as needed (patient reported not taking in 6 months).   Uric acid: 7.9 (02/17/2019)  We discussed:  Eating a low-purine diet. Avoid foods and drinks such as: liver, kidney, anchovies, asparagus, herring, mushrooms, mussels, beer,etc.  Plan Continue current medications  Anxiety   Patient has failed these meds in past: none  Patient is currently controlled on the following medications:  - fluoxetine 20mg , 2 capsules daily  - alprazolam 0.5mg , 1 tablet at bedtime as needed for sleep/anxiety  - patient reported taking 0.5 tablet of alprazolam when needed. She reports taking 2x/week.   Plan Continue current medications  Migraines   Patient has failed these meds in past: none  Patient is currently controlled on the following medications:  - hydrocodone/ APAP 10/325mg , 1 to 2 tablets every six hours as needed (for bad headaches- not taken for >6 months ) - Tramadol 50 mg every 6 hours for pain (came from orthopedic surgeon)  -  Tylenol 500mg , takes 1 to 2 tablets a day if needed. Patient notes does not very often.   - Patient relies on Tylenol first and then will consider tramadol.   Plan Continue current medications.   Constipation  Patient has failed these meds in past: none  Patient is currently controlled on the following medications:  Probiotic, 1 capsule once daily .   Plan Managed by Dr. Donalee Citrin Continue current medications  GERD  Patient has failed these meds in past: none  Patient is currently controlled on the following medications: - Omeprazole 20mg , 1 capsule daily   We discussed:  non-pharmacological interventions for acid reflux. Take measures to prevent acid reflux, such as avoiding spicy foods, avoiding caffeine, avoid laying down a few hours after eating, and raising the head of the bed. - patient states she has symptoms when forgets to take a dose. Symptoms not influenced by food.   Plan Continue current medications.   Aspirin therapy  Patient has failed these meds in past: none  Patient is currently controlled on the following medications:  - aspirin 81mg , 1 tablet daily (night)  Plan Continue current medications.   Medication Management  Patient organizes medications: uses pill box   Barriers: denies issues obtaining medications.  Adherence: no gap in fill history, except pravastatin (last filled 12/06/19 for 30DS) (based on medication dispense history); patient cutting dose due to intolerance.  - Discussed importance on alendronate adherence.   Follow up Follow up visit with PharmD in 1 year.  Will conduct general telephone calls for periodic check-ins before next visit.   Anson Crofts, PharmD Clinical Pharmacist [facility] [place] [phone]

## 2020-03-03 ENCOUNTER — Ambulatory Visit: Payer: PPO

## 2020-03-03 ENCOUNTER — Other Ambulatory Visit: Payer: Self-pay

## 2020-03-03 DIAGNOSIS — E785 Hyperlipidemia, unspecified: Secondary | ICD-10-CM

## 2020-03-03 DIAGNOSIS — I10 Essential (primary) hypertension: Secondary | ICD-10-CM

## 2020-03-06 ENCOUNTER — Other Ambulatory Visit: Payer: Self-pay

## 2020-03-06 NOTE — Patient Instructions (Addendum)
Visit Information  Goals Addressed            This Visit's Progress   . Pharmacy Care Plan       CARE PLAN ENTRY  Current Barriers:  . Chronic Disease Management support, education, and care coordination needs related to CHF, HTN, HLD, and Nonischemic cardiomyopathy, Migraine, constipation, GERD, Gout   Pharmacist Clinical Goal(s):  . Heart failure: Check weight daily and contact physician if weight gain of more than 3 pounds overnight or more than 5 pounds in a week. . Blood pressure:   [name] Maintain Blood pressure <130/80 mmHg  . High cholesterol:  . Cholesterol goals: Total Cholesterol goal under 200, Triglycerides goal under 150, HDL goal above 40 (men) or above 50 (women), LDL goal under 100.  . Osteoporosis . Prevent bone fractures and ensure daily intake of calcium and vitamin D.  . Anxiety: Continue to see improvement in anxiety symptoms . Migraine: Continue to see improvement in pain level.  . Constipation: Continue to see improvement in bowel movements.  . Heart burn: Minimize reflux symptoms.   Interventions: . Comprehensive medication review performed. . Blood pressure:  . Discussed need to continue checking blood pressure at home.  . Discussed diet modifications. DASH diet:  following a diet emphasizing fruits and vegetables and low-fat dairy products along with whole grains, fish, poultry, and nuts. Reducing red meats and sugars.  . Reducing the amount of salt intake to <1579m/per day.  . Recommend using a salt substitute to replace your salt if you need flavor.      . Weight reduction- We discussed losing 5-10% of body weight . Continue: metoprolol succinate 556m 1 tablet once daily with or immediately following a meal and spironolactone 2533m0.5 tablet daily  . High cholesterol . How to reduce cholesterol through diet/weight management and physical activity.    . We discussed how a diet high in plant sterols (fruits/vegetables/nuts/whole grains/legumes) may  reduce your cholesterol.  Encouraged increasing fiber to a daily intake of 10-25g/day  . Continue: pravastatin 3m18mery evening and omega-3 fatty acids 1200mg33mcapsule daily.  . Osteoporosis . Discussed importance of adherence.  . Continue: alendronate 70mg,11mablet every 7 days.  . Gout . Eating a low-purine diet. Avoid foods and drinks such as: liver, kidney, anchovies, asparagus, herring, mushrooms, mussels, beer,etc. . Continue: allopurinol 300mg, 22mblet daily and colchicine 0.6mg, 1 80mlet daily as needed . Anxiety . Continue fluoxetine 20mg, 2 53mules daily and alprazolam 0.5mg, 1 ta62mt at bedtime as needed for sleep/anxiety.  . Migraine  . Continue: Tylenol 500mg, take79mto 2 tablets a day if needed and Tramadol 50 mg every 6 hours for pain  . Constipation . Continue: daily intake of probiotic.  . GERD: . DMarland Kitchenscussed non-pharmacological interventions for acid reflux. Take measures to prevent acid reflux, such as avoiding spicy foods, avoiding caffeine, avoid laying down a few hours after eating, and raising the head of the bed. . Continue: omeprazole 20mg, 1 cap39m daily  Patient Self Care Activities:  . Self administers medications as prescribed and Calls provider office for new concerns or questions . Continue current medications as directed by providers.  . Continue following up with specialists. . Continue at home blood pressure readings. . Continue working on health habits (diet/ exercise).  Initial goal documentation        Ms. Massie was given information about Chronic Care Management services today including:  1. CCM service includes personalized support from designated clinical  staff supervised by her physician, including individualized plan of care and coordination with other care providers 2. 24/7 contact phone numbers for assistance for urgent and routine care needs. 3. Standard insurance, coinsurance, copays and deductibles apply for chronic care  management only during months in which we provide at least 20 minutes of these services. Most insurances cover these services at 100%, however patients may be responsible for any copay, coinsurance and/or deductible if applicable. This service may help you avoid the need for more expensive face-to-face services. 4. Only one practitioner may furnish and bill the service in a calendar month. 5. The patient may stop CCM services at any time (effective at the end of the month) by phone call to the office staff.  Patient agreed to services and verbal consent obtained.   The patient verbalized understanding of instructions provided today and agreed to receive a mailed copy of patient instruction and/or educational materials. Telephone follow up appointment with pharmacy team member scheduled for: 03/02/2021.   Anson Crofts, PharmD Clinical Pharmacist [facility] [phone]     Osteoporosis  Osteoporosis happens when your bones get thin and weak. This can cause your bones to break (fracture) more easily. You can do things at home to make your bones stronger. Follow these instructions at home:  Activity  Exercise as told by your doctor. Ask your doctor what activities are safe for you. You should do: ? Exercises that make your muscles work to hold your body weight up (weight-bearing exercises). These include tai chi, yoga, and walking. ? Exercises to make your muscles stronger. One example is lifting weights. Lifestyle  Limit alcohol intake to no more than 1 drink a day for nonpregnant women and 2 drinks a day for men. One drink equals 12 oz of beer, 5 oz of wine, or 1 oz of hard liquor.  Do not use any products that have nicotine or tobacco in them. These include cigarettes and e-cigarettes. If you need help quitting, ask your doctor. Preventing falls  Use tools to help you move around (mobility aids) as needed. These include canes, walkers, scooters, and  crutches.  Keep rooms well-lit and free of clutter.  Put away things that could make you trip. These include cords and rugs.  Install safety rails on stairs. Install grab bars in bathrooms.  Use rubber mats in slippery areas, like bathrooms.  Wear shoes that: ? Fit you well. ? Support your feet. ? Have closed toes. ? Have rubber soles or low heels.  Tell your doctor about all of the medicines you are taking. Some medicines can make you more likely to fall. General instructions  Eat plenty of calcium and vitamin D. These nutrients are good for your bones. Good sources of calcium and vitamin D include: ? Some fatty fish, such as salmon and tuna. ? Foods that have calcium and vitamin D added to them (fortified foods). For example, some breakfast cereals are fortified with calcium and vitamin D. ? Egg yolks. ? Cheese. ? Liver.  Take over-the-counter and prescription medicines only as told by your doctor.  Keep all follow-up visits as told by your doctor. This is important. Contact a doctor if:  You have not been tested (screened) for osteoporosis and you are: ? A woman who is age 54 or older. ? A man who is age 4 or older. Get help right away if:  You fall.  You get hurt. Summary  Osteoporosis happens when your bones get  thin and weak.  Weak bones can break (fracture) more easily.  Eat plenty of calcium and vitamin D. These nutrients are good for your bones.  Tell your doctor about all of the medicines that you take. This information is not intended to replace advice given to you by your health care provider. Make sure you discuss any questions you have with your health care provider. Document Revised: 11/21/2017 Document Reviewed: 10/03/2017 Elsevier Patient Education  [place] DASH stands for "Dietary Approaches to Stop Hypertension." The DASH eating plan is a healthy eating plan that has been shown to reduce high blood pressure  (hypertension). It may also reduce your risk for type 2 diabetes, heart disease, and stroke. The DASH eating plan may also help with weight loss. What are tips for following this plan?  General guidelines  Avoid eating more than 2,300 mg (milligrams) of salt (sodium) a day. If you have hypertension, you may need to reduce your sodium intake to 1,500 mg a day.  Limit alcohol intake to no more than 1 drink a day for nonpregnant women and 2 drinks a day for men. One drink equals 12 oz of beer, 5 oz of wine, or 1 oz of hard liquor.  Work with your health care provider to maintain a healthy body weight or to lose weight. Ask what an ideal weight is for you.  Get at least 30 minutes of exercise that causes your heart to beat faster (aerobic exercise) most days of the week. Activities may include walking, swimming, or biking.  Work with your health care provider or diet and nutrition specialist (dietitian) to adjust your eating plan to your individual calorie needs. Reading food labels   Check food labels for the amount of sodium per serving. Choose foods with less than 5 percent of the Daily Value of sodium. Generally, foods with less than 300 mg of sodium per serving fit into this eating plan.  To find whole grains, look for the word "whole" as the first word in the ingredient list. Shopping  Buy products labeled as "low-sodium" or "no salt added."  Buy fresh foods. Avoid canned foods and premade or frozen meals. Cooking  Avoid adding salt when cooking. Use salt-free seasonings or herbs instead of table salt or sea salt. Check with your health care provider or pharmacist before using salt substitutes.  Do not fry foods. Cook foods using healthy methods such as baking, boiling, grilling, and broiling instead.  Cook with heart-healthy oils, such as olive, canola, soybean, or sunflower oil. Meal planning  Eat a balanced diet that includes: ? 5 or more servings of fruits and vegetables  each day. At each meal, try to fill half of your plate with fruits and vegetables. ? Up to 6-8 servings of whole grains each day. ? Less than 6 oz of lean meat, poultry, or fish each day. A 3-oz serving of meat is about the same size as a deck of cards. One egg equals 1 oz. ? 2 servings of low-fat dairy each day. ? A serving of nuts, seeds, or beans 5 times each week. ? Heart-healthy fats. Healthy fats called Omega-3 fatty acids are found in foods such as flaxseeds and coldwater fish, like sardines, salmon, and mackerel.  Limit how much you eat of the following: ? Canned or prepackaged foods. ? Food that is high in trans fat, such as fried foods. ? Food that is high in saturated fat, such as fatty meat. ?  Sweets, desserts, sugary drinks, and other foods with added sugar. ? Full-fat dairy products.  Do not salt foods before eating.  Try to eat at least 2 vegetarian meals each week.  Eat more home-cooked food and less restaurant, buffet, and fast food.  When eating at a restaurant, ask that your food be prepared with less salt or no salt, if possible. What foods are recommended? The items listed may not be a complete list. Talk with your dietitian about what dietary choices are best for you. Grains Whole-grain or whole-wheat bread. Whole-grain or whole-wheat pasta. Brown rice. [place]. Bulgur. Whole-grain and low-sodium cereals. Pita bread. Low-fat, low-sodium crackers. Whole-wheat flour tortillas. Vegetables Fresh or frozen vegetables (raw, steamed, roasted, or grilled). Low-sodium or reduced-sodium tomato and vegetable juice. Low-sodium or reduced-sodium tomato sauce and tomato paste. Low-sodium or reduced-sodium canned vegetables. Fruits All fresh, dried, or frozen fruit. Canned fruit in natural juice (without added sugar). Meat and other protein foods Skinless chicken or [place]. Ground chicken or [place]. Pork with fat trimmed off. Fish and seafood. Egg whites. Dried beans,  peas, or lentils. Unsalted nuts, nut butters, and seeds. Unsalted canned beans. Lean cuts of beef with fat trimmed off. Low-sodium, lean deli meat. Dairy Low-fat (1%) or fat-free (skim) milk. Fat-free, low-fat, or reduced-fat cheeses. Nonfat, low-sodium ricotta or cottage cheese. Low-fat or nonfat yogurt. Low-fat, low-sodium cheese. Fats and oils Soft margarine without trans fats. Vegetable oil. Low-fat, reduced-fat, or light mayonnaise and salad dressings (reduced-sodium). Canola, safflower, olive, soybean, and sunflower oils. Avocado. Seasoning and other foods Herbs. Spices. Seasoning mixes without salt. Unsalted popcorn and pretzels. Fat-free sweets. What foods are not recommended? The items listed may not be a complete list. Talk with your dietitian about what dietary choices are best for you. Grains Baked goods made with fat, such as croissants, muffins, or some breads. Dry pasta or rice meal packs. Vegetables Creamed or fried vegetables. Vegetables in a cheese sauce. Regular canned vegetables (not low-sodium or reduced-sodium). Regular canned tomato sauce and paste (not low-sodium or reduced-sodium). Regular tomato and vegetable juice (not low-sodium or reduced-sodium). Angie Fava. Olives. Fruits Canned fruit in a light or heavy syrup. Fried fruit. Fruit in cream or butter sauce. Meat and other protein foods Fatty cuts of meat. Ribs. Fried meat. Berniece Salines. Sausage. Bologna and other processed lunch meats. Salami. Fatback. Hotdogs. Bratwurst. Salted nuts and seeds. Canned beans with added salt. Canned or smoked fish. Whole eggs or egg yolks. Chicken or [place] with skin. Dairy Whole or 2% milk, cream, and half-and-half. Whole or full-fat cream cheese. Whole-fat or sweetened yogurt. Full-fat cheese. Nondairy creamers. Whipped toppings. Processed cheese and cheese spreads. Fats and oils Butter. Stick margarine. Lard. Shortening. Ghee. Bacon fat. Tropical oils, such as coconut, palm kernel, or palm  oil. Seasoning and other foods Salted popcorn and pretzels. Onion salt, garlic salt, seasoned salt, table salt, and sea salt. Worcestershire sauce. Tartar sauce. Barbecue sauce. Teriyaki sauce. Soy sauce, including reduced-sodium. Steak sauce. Canned and packaged gravies. Fish sauce. Oyster sauce. Cocktail sauce. Horseradish that you find on the shelf. Ketchup. Mustard. Meat flavorings and tenderizers. Bouillon cubes. Hot sauce and Tabasco sauce. Premade or packaged marinades. Premade or packaged taco seasonings. Relishes. Regular salad dressings. Where to find more information:  National Heart, Lung, and [place]: https://wilson-eaton.com/  American Heart Association: www.heart.org Summary  The DASH eating plan is a healthy eating plan that has been shown to reduce high blood pressure (hypertension). It may also reduce your risk for type 2 diabetes,  heart disease, and stroke.  With the DASH eating plan, you should limit salt (sodium) intake to 2,300 mg a day. If you have hypertension, you may need to reduce your sodium intake to 1,500 mg a day.  When on the DASH eating plan, aim to eat more fresh fruits and vegetables, whole grains, lean proteins, low-fat dairy, and heart-healthy fats.  Work with your health care provider or diet and nutrition specialist (dietitian) to adjust your eating plan to your individual calorie needs. This information is not intended to replace advice given to you by your health care provider. Make sure you discuss any questions you have with your health care provider. Document Revised: 11/21/2017 Document Reviewed: 12/02/2016 Elsevier Patient Education  2020 [employer].

## 2020-03-07 ENCOUNTER — Encounter: Payer: Self-pay | Admitting: Family Medicine

## 2020-03-07 ENCOUNTER — Ambulatory Visit (INDEPENDENT_AMBULATORY_CARE_PROVIDER_SITE_OTHER): Payer: PPO | Admitting: Family Medicine

## 2020-03-07 VITALS — BP 110/62 | HR 85 | Temp 97.6°F | Wt 167.9 lb

## 2020-03-07 DIAGNOSIS — H9203 Otalgia, bilateral: Secondary | ICD-10-CM

## 2020-03-07 NOTE — Progress Notes (Signed)
Subjective:     Patient ID: Jessica Mcneil, female   DOB: January 07, 1941, 79 y.o.   MRN: [id number]  HPI   Marijke is seen with estimated 3 to 4 months of some double pain in both ears but predominately right ear.  There was some question raised by her sister whether she may have lost some hearing.  She has not had any recent audiology assessment.  Denies any tinnitus.  No vertigo.  No headaches.  No clear exacerbating or alleviating factors.  Denies any major nasal congestive symptoms.  Past Medical History:  Diagnosis Date  . Anxiety   . Arthritis   . CHF (congestive heart failure) ([place])    due to non ischemic cardiomyopathy  . Depression   . GERD (gastroesophageal reflux disease)   . Headache(784.0)    migraines  . HLD (hyperlipidemia)   . HTN (hypertension)   . Overweight(278.02)   . Pneumonia   . PONV (postoperative nausea and vomiting)    long time ago none recent  . 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 Shortness of breath    Past Surgical History:  Procedure Laterality Date  . ANTERIOR AND POSTERIOR REPAIR  2003   bladder  . APPENDECTOMY    . BELPHAROPTOSIS REPAIR     bilat  . BREAST BIOPSY Right   . BREAST LUMPECTOMY WITH NEEDLE LOCALIZATION Right 02/12/2013   Procedure: BREAST LUMPECTOMY WITH NEEDLE LOCALIZATION;  Surgeon: Merrie Roof, MD;  Location: [facility];  Service: General;  Laterality: Right;  . CHOLECYSTECTOMY    . COLONOSCOPY    . EYE SURGERY     Glaucoma Implant  . TONSILLECTOMY    . TOTAL SHOULDER ARTHROPLASTY Right 12/09/2017   Procedure: REVERSE TOTAL SHOULDER ARTHROPLASTY;  Surgeon: Marchia Bond, MD;  Location: [facility];  Service: Orthopedics;  Laterality: Right;  [name] VAGINAL HYSTERECTOMY  1998    reports that she quit smoking about 37 years ago. Her smoking use included cigarettes. She smoked 2.00 packs per day. She has never used smokeless tobacco. She reports that she does not drink alcohol or use drugs. family  history includes Arthritis in an other family member; Heart disease in her father; Hyperlipidemia in an other family member. Allergies  Allergen Reactions  . Lipitor [Atorvastatin]     UNSPECIFIED REACTION   . Codeine Itching  . Erythromycin Nausea And Vomiting    GI UPSET  . Sulfamethoxazole Itching    Over 40 years ago     Review of Systems  Constitutional: Negative for chills and fever.  HENT: Positive for ear pain and hearing loss. Negative for congestion and ear discharge.   Respiratory: Negative for shortness of breath.   Cardiovascular: Negative for chest pain.       Objective:   Physical Exam Vitals reviewed.  Constitutional:      Appearance: Normal appearance.  HENT:     Right Ear: Tympanic membrane and ear canal normal.     Left Ear: Tympanic membrane and ear canal normal.     Mouth/Throat:     Mouth: Mucous membranes are moist.     Pharynx: Oropharynx is clear.  Cardiovascular:     Rate and Rhythm: Normal rate and regular rhythm.  Musculoskeletal:     Cervical back: Neck supple.  Lymphadenopathy:     Cervical: No cervical adenopathy.  Neurological:     Mental Status: She is alert.        Assessment:     Several  month history of bilateral earache with normal exam.  She does not describe any eustachian tube dysfunction type symptoms and no nasal congestion.  Both eardrums appear entirely normal.  She is describing possible hearing loss but no formal testing    Plan:     -We recommended audiology assessment and she will set up  Eulas Post MD [facility]

## 2020-03-07 NOTE — Patient Instructions (Signed)
Consider Flonase or Zyrtec for allergy symptoms  Recommend Audiology assessment.  Could consider [facility] clinic

## 2020-05-04 ENCOUNTER — Other Ambulatory Visit: Payer: Self-pay | Admitting: Family Medicine

## 2020-05-09 ENCOUNTER — Other Ambulatory Visit: Payer: Self-pay | Admitting: Family Medicine

## 2020-05-09 DIAGNOSIS — Z1231 Encounter for screening mammogram for malignant neoplasm of breast: Secondary | ICD-10-CM

## 2020-05-23 ENCOUNTER — Other Ambulatory Visit: Payer: Self-pay | Admitting: Family Medicine

## 2020-06-19 ENCOUNTER — Ambulatory Visit
Admission: RE | Admit: 2020-06-19 | Discharge: 2020-06-19 | Disposition: A | Discharge status: Home / Self Care | Payer: PPO | Source: Ambulatory Visit | Attending: Family Medicine | Admitting: Family Medicine

## 2020-06-19 ENCOUNTER — Other Ambulatory Visit: Payer: Self-pay

## 2020-06-19 DIAGNOSIS — Z1231 Encounter for screening mammogram for malignant neoplasm of breast: Secondary | ICD-10-CM

## 2020-06-26 DIAGNOSIS — M19012 Primary osteoarthritis, left shoulder: Secondary | ICD-10-CM | POA: Diagnosis not present

## 2020-07-31 ENCOUNTER — Other Ambulatory Visit: Payer: Self-pay | Admitting: Family Medicine

## 2020-09-12 DIAGNOSIS — K5730 Diverticulosis of large intestine without perforation or abscess without bleeding: Secondary | ICD-10-CM | POA: Diagnosis not present

## 2020-09-12 DIAGNOSIS — K582 Mixed irritable bowel syndrome: Secondary | ICD-10-CM | POA: Diagnosis not present

## 2020-09-27 ENCOUNTER — Other Ambulatory Visit: Payer: Self-pay | Admitting: Cardiology

## 2020-10-02 ENCOUNTER — Other Ambulatory Visit: Payer: Self-pay

## 2020-10-03 MED ORDER — SPIRONOLACTONE 25 MG PO TABS: ORAL_TABLET | ORAL | 0 refills | Status: DC

## 2020-10-04 NOTE — Progress Notes (Signed)
Cardiology Office Note    Date:  10/10/2020   ID:  Merriel Zinger [place], DOB 1941/08/06, MRN [id number]  PCP:  Eulas Post, MD  Cardiologist: Ena Dawley, MD EPS: None  Chief Complaint  Patient presents with  . Follow-up    History of Present Illness:  Shirleymae Hauth is a 79 y.o. female with history of NICM with normal coronary arteries, EF 50%  2007 on cath, LVEF 35-40% on echo. Most recent echo 11/2016 EF 45-50% mild AI, normal RV-similar on echo 09/29/19. Also has HLD   I last had a telemedicine visit with the patient 04/20/2019 and was doing well.  Patient comes in for f/u. Still complains of dyspnea on exertion. Can walk 2 large stores without stopping but that's all. Walks her dog about 1 mile daily. Thinks she has less stamina and gets out of breath more readily than a year ago. Was out of spironolactone for a couple days. Was followed at [facility] and BP too low to tolerate ACEI/ARB in the past. Denies chest pain. Occasional dizziness when she stands up. Rarely takes HCTZ for edema.   Past Medical History:  Diagnosis Date  . Anxiety   . Arthritis   . CHF (congestive heart failure) ([place])    due to non ischemic cardiomyopathy  . Depression   . GERD (gastroesophageal reflux disease)   . Headache(784.0)    migraines  . HLD (hyperlipidemia)   . HTN (hypertension)   . Overweight(278.02)   . Pneumonia   . PONV (postoperative nausea and vomiting)    long time ago none recent  . 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 Shortness of breath     Past Surgical History:  Procedure Laterality Date  . ANTERIOR AND POSTERIOR REPAIR  2003   bladder  . APPENDECTOMY    . BELPHAROPTOSIS REPAIR     bilat  . BREAST BIOPSY Right   . BREAST LUMPECTOMY WITH NEEDLE LOCALIZATION Right 02/12/2013   Procedure: BREAST LUMPECTOMY WITH NEEDLE LOCALIZATION;  Surgeon: Merrie Roof, MD;  Location: [facility];  Service: General;  Laterality: Right;    . CHOLECYSTECTOMY    . COLONOSCOPY    . EYE SURGERY     Glaucoma Implant  . TONSILLECTOMY    . TOTAL SHOULDER ARTHROPLASTY Right 12/09/2017   Procedure: REVERSE TOTAL SHOULDER ARTHROPLASTY;  Surgeon: Marchia Bond, MD;  Location: [facility];  Service: Orthopedics;  Laterality: Right;  [name] VAGINAL HYSTERECTOMY  1998    Current Medications: Current Meds  Medication Sig  . acetaminophen (TYLENOL) 500 MG tablet Take 500 mg by mouth. Patient reports taking 1 to 2 tablets a day if needed.  [name] allopurinol (ZYLOPRIM) 300 MG tablet TAKE 1 TABLET BY MOUTH EVERY DAY  . ALPRAZolam (XANAX) 0.5 MG tablet TAKE 1 TABLET BY MOUTH AT  BEDTIME AS NEEDED FOR  SLEEP/ANXIETY  . aspirin EC 81 MG tablet Take 81 mg by mouth daily.  [name] CALCIUM-MAGNESIUM-ZINC PO Take 1 tablet by mouth daily.  . colchicine 0.6 MG tablet Take 0.6 mg by mouth as needed (gout flare ups).  . docusate sodium (COLACE) 100 MG capsule Take 200 mg by mouth at bedtime.  [name] FLUoxetine (PROZAC) 20 MG capsule TAKE 2 CAPSULES BY MOUTH EVERY DAY  . hydrochlorothiazide (MICROZIDE) 12.5 MG capsule Take 1 capsule (12.5 mg total) by mouth daily as needed (Swelling).  [name] HYDROcodone-acetaminophen (NORCO) 10-325 MG tablet Take 1-2 tablets by mouth every 6 (six) hours as needed.  [name]  latanoprost (XALATAN) 0.005 % ophthalmic solution Place 1 drop into the right eye at bedtime.  . metoprolol succinate (TOPROL-XL) 50 MG 24 hr tablet Take with or immediately following a meal.  . Multiple Vitamin (MULTIVITAMIN WITH MINERALS) TABS Take 1 tablet by mouth daily.  . Omega-3 Fatty Acids 1200 MG CAPS Take 1,200 mg by mouth daily.  [name] omeprazole (PRILOSEC) 20 MG capsule TAKE 1 CAPSULE BY MOUTH EVERY DAY  . pravastatin (PRAVACHOL) 10 MG tablet Take 1 tablet (10 mg total) by mouth every evening.  . Probiotic Product (PROBIOTIC ADVANCED PO) Take by mouth daily.  . traMADol (ULTRAM) 50 MG tablet Take 50 mg by mouth every 6 (six) hours as needed for moderate pain.  .  [DISCONTINUED] alendronate (FOSAMAX) 70 MG tablet Take 1 tablet (70 mg total) by mouth every 7 (seven) days. Take with a full glass of water on an empty stomach.  . [DISCONTINUED] colchicine 0.6 MG tablet Take 1 tablet (0.6 mg total) by mouth daily. (Patient taking differently: Take 0.6 mg by mouth daily as needed. )  . [DISCONTINUED] GARLIC PO Take 1 tablet by mouth 2 (two) times daily.  . [DISCONTINUED] hydrochlorothiazide (MICROZIDE) 12.5 MG capsule Take 1 capsule (12.5 mg total) by mouth daily as needed (Swelling).  . [DISCONTINUED] metoprolol succinate (TOPROL-XL) 50 MG 24 hr tablet TAKE 1 TABLET BY MOUTH ONCE DAILY WITH OR IMMEDIATELY FOLLOWING A MEAL.  . [DISCONTINUED] Misc Natural Products (TART CHERRY ADVANCED) CAPS Take 1 capsule by mouth daily. 1200 mg Daily  . [DISCONTINUED] pravastatin (PRAVACHOL) 20 MG tablet TAKE 1 TABLET BY MOUTH  EVERY EVENING (Patient taking differently: Take 10 mg by mouth daily. )  . [DISCONTINUED] spironolactone (ALDACTONE) 12.5 mg TABS tablet Take 12.5 mg by mouth daily.  . [DISCONTINUED] spironolactone (ALDACTONE) 25 MG tablet Please call to schedule appointment for future refills. Thank You! (Patient taking differently: 12.5 mg daily. Please call to schedule appointment for future refills. Thank You!)     Allergies:   Lipitor [atorvastatin], Codeine, Erythromycin, and Sulfamethoxazole   Social History   Socioeconomic History  . Marital status: Divorced    Spouse name: Not on file  . Number of children: Not on file  . Years of education: Not on file  . Highest education level: Not on file  Occupational History  . Occupation: retired  Tobacco Use  . Smoking status: Former Smoker    Packs/day: 2.00    Types: Cigarettes    Quit date: 12/23/1982    Years since quitting: 37.8  . Smokeless tobacco: Never Used  Vaping Use  . Vaping Use: Never used  Substance and Sexual Activity  . Alcohol use: No  . Drug use: No  . Sexual activity: Not on file  Other  Topics Concern  . Not on file  Social History Narrative  . Not on file   Social Determinants of Health   Financial Resource Strain:   . Difficulty of Paying Living Expenses: Not on file  Food Insecurity:   . Worried About Charity fundraiser in the Last Year: Not on file  . Ran Out of Food in the Last Year: Not on file  Transportation Needs:   . Lack of Transportation (Medical): Not on file  . Lack of Transportation (Non-Medical): Not on file  Physical Activity:   . Days of Exercise per Week: Not on file  . Minutes of Exercise per Session: Not on file  Stress:   . Feeling of Stress : Not on file  Social Connections:   . Frequency of Communication with Friends and Family: Not on file  . Frequency of Social Gatherings with Friends and Family: Not on file  . Attends Religious Services: Not on file  . Active Member of Clubs or Organizations: Not on file  . Attends Archivist Meetings: Not on file  . Marital Status: Not on file     Family History:  The patient's family history includes Arthritis in an other family member; Heart disease in her father; Hyperlipidemia in an other family member.   ROS:   Please see the history of present illness.    ROS All other systems reviewed and are negative.   PHYSICAL EXAM:   VS:  BP 112/70   Pulse 74   Ht 5\' 7"  (1.702 m)   Wt 164 lb (74.4 kg)   SpO2 98%   BMI 25.69 kg/m   Physical Exam  GEN: Well nourished, well developed, in no acute distress  Neck: no JVD, carotid bruits, or masses Cardiac:RRR; no murmurs, rubs, or gallops  Respiratory:  Decreased breath sounds with crackles at bases but clear elsewhere GI: soft, nontender, nondistended, + BS Ext: without cyanosis, clubbing, or edema, Good distal pulses bilaterally Neuro:  Alert and Oriented x 3 Psych: euthymic mood, full affect  Wt Readings from Last 3 Encounters:  10/10/20 164 lb (74.4 kg)  03/07/20 167 lb 14.4 oz (76.2 kg)  09/03/19 169 lb 8 oz (76.9 kg)       Studies/Labs Reviewed:   EKG:  EKG is  ordered today.  The ekg ordered today demonstrates NSR IRBBB, no change since 2018  Recent Labs: 11/24/2019: Hemoglobin 13.2; Platelets 357   Lipid Panel    Component Value Date/Time   CHOL 236 (A) 11/24/2019 0000   TRIG 80 11/24/2019 0000   HDL 62 11/24/2019 0000   CHOLHDL 5 02/17/2019 1443   VLDL 34.2 02/17/2019 1443   LDLCALC 160 11/24/2019 0000   LDLDIRECT 216.7 06/05/2011 1105    Additional studies/ records that were reviewed today include:  Echo 09/2019 IMPRESSIONS     1. Left ventricular ejection fraction, by visual estimation, is 45 to  50%. The left ventricle has mildly decreased function. There is no left  ventricular hypertrophy.   2. Left ventricular diastolic Doppler parameters are consistent with  impaired relaxation pattern of LV diastolic filling.   3. Global right ventricle has moderately reduced systolic function.The  right ventricular size is normal. No increase in right ventricular wall  thickness.   4. Left atrial size was normal.   5. Right atrial size was normal.   6. The mitral valve is normal in structure. No evidence of mitral valve  regurgitation.   7. The tricuspid valve is normal in structure. Tricuspid valve  regurgitation is mild.   8. The aortic valve is normal in structure. Aortic valve regurgitation is  mild by color flow Doppler. Structurally normal aortic valve, with no  evidence of sclerosis or stenosis.   9. The pulmonic valve was abnormal. Pulmonic valve regurgitation is not  visualized by color flow Doppler.  10. Mildly elevated pulmonary artery systolic pressure.  11. The atrial septum is grossly normal.    Echo 2017Study Conclusions   - Left ventricle: The cavity size was normal. Wall thickness was   normal. Systolic function was mildly reduced. The estimated   ejection fraction was in the range of 45% to 50%. Diffuse   hypokinesis. Doppler parameters are consistent with abnormal  left  ventricular relaxation (grade 1 diastolic dysfunction). GLS:-18% - Aortic valve: Trileaflet; mildly thickened, mildly calcified   leaflets. There was mild regurgitation.   Impressions:   - Compared to the prior study, there has been no significant   interval change.       ASSESSMENT:    1. NICM (nonischemic cardiomyopathy) ([place])   2. Essential hypertension   3. Hyperlipidemia, unspecified hyperlipidemia type   4. Chronic systolic heart failure (HCC)      PLAN:  In order of problems listed above:   1. History of NICM,  EF 51-70%-12/29/4942 chronic systolic CHF-compensated but worsening DOE and decreased stamina in the past year. Will update echo and depending on results consider NST. She does have basilar crackles and was a former smoker. No CXR since 2014-wants to have at PCP  2. Essential HTN- BP tends to run low and hasn't tolerated ACEI/ARB in past.    3.   HLD-LDL 160 11/24/19 on pravachol managed by PCP- Patient says she can only take 10 mg because of muscle aches. Will check today.     Medication Adjustments/Labs and Tests Ordered: Current medicines are reviewed at length with the patient today.  Concerns regarding medicines are outlined above.  Medication changes, Labs and Tests ordered today are listed in the Patient Instructions below. Patient Instructions  Medication Instructions:  Your physician recommends that you continue on your current medications as directed. Please refer to the Current Medication list given to you today.  *If you need a refill on your cardiac medications before your next appointment, please call your pharmacy*   Lab Work: TODAY: FLP, CMET, CBC, TSH  If you have labs (blood work) drawn today and your tests are completely normal, you will receive your results only by: [name] MyChart Message (if you have MyChart) OR . A paper copy in the mail If you have any lab test that is abnormal or we need to change your treatment, we will call you  to review the results.   Testing/Procedures: Your physician has requested that you have an echocardiogram. Echocardiography is a painless test that uses sound waves to create images of your heart. It provides your doctor with information about the size and shape of your heart and how well your heart's chambers and valves are working. This procedure takes approximately one hour. There are no restrictions for this procedure.   Follow-Up: At [facility] you and your health needs are our priority.  As part of our continuing mission to provide you with exceptional heart care, we have created designated Provider Care Teams.  These Care Teams include your primary Cardiologist (physician) and Advanced Practice Providers (APPs -  Physician Assistants and Nurse Practitioners) who all work together to provide you with the care you need, when you need it.  We recommend signing up for the patient portal called "MyChart".  Sign up information is provided on this After Visit Summary.  MyChart is used to connect with patients for Virtual Visits (Telemedicine).  Patients are able to view lab/test results, encounter notes, upcoming appointments, etc.  Non-urgent messages can be sent to your provider as well.   To learn more about what you can do with MyChart, go to [email or web address].    Your next appointment:   After Echocardiogram with Ermalinda Barrios, PA-C     Signed, Ermalinda Barrios, PA-C  10/10/2020 10:22 AM    [facility] [address], [place], [place]  [address] Phone: [phone]; Fax: [phone]

## 2020-10-10 ENCOUNTER — Ambulatory Visit: Payer: PPO | Admitting: Physician Assistant

## 2020-10-10 ENCOUNTER — Encounter: Payer: Self-pay | Admitting: Physician Assistant

## 2020-10-10 ENCOUNTER — Other Ambulatory Visit: Payer: Self-pay

## 2020-10-10 VITALS — BP 112/70 | HR 74 | Ht 67.0 in | Wt 164.0 lb

## 2020-10-10 DIAGNOSIS — E785 Hyperlipidemia, unspecified: Secondary | ICD-10-CM | POA: Diagnosis not present

## 2020-10-10 DIAGNOSIS — I10 Essential (primary) hypertension: Secondary | ICD-10-CM

## 2020-10-10 DIAGNOSIS — I5022 Chronic systolic (congestive) heart failure: Secondary | ICD-10-CM | POA: Diagnosis not present

## 2020-10-10 DIAGNOSIS — I428 Other cardiomyopathies: Secondary | ICD-10-CM

## 2020-10-10 LAB — CBC

## 2020-10-10 MED ORDER — HYDROCHLOROTHIAZIDE 12.5 MG PO CAPS: 12.5000 mg | ORAL_CAPSULE | Freq: Every day | ORAL | 3 refills | Status: DC | PRN

## 2020-10-10 MED ORDER — METOPROLOL SUCCINATE ER 50 MG PO TB24: ORAL_TABLET | ORAL | 3 refills | Status: DC

## 2020-10-10 MED ORDER — SPIRONOLACTONE 25 MG PO TABS: 12.5000 mg | ORAL_TABLET | Freq: Every day | ORAL | 3 refills | Status: DC

## 2020-10-10 MED ORDER — PRAVASTATIN SODIUM 10 MG PO TABS: 10.0000 mg | ORAL_TABLET | Freq: Every evening | ORAL | 3 refills | Status: DC

## 2020-10-10 NOTE — Patient Instructions (Signed)
Medication Instructions:  Your physician recommends that you continue on your current medications as directed. Please refer to the Current Medication list given to you today.  *If you need a refill on your cardiac medications before your next appointment, please call your pharmacy*   Lab Work: TODAY: FLP, CMET, CBC, TSH  If you have labs (blood work) drawn today and your tests are completely normal, you will receive your results only by: [name] MyChart Message (if you have MyChart) OR . A paper copy in the mail If you have any lab test that is abnormal or we need to change your treatment, we will call you to review the results.   Testing/Procedures: Your physician has requested that you have an echocardiogram. Echocardiography is a painless test that uses sound waves to create images of your heart. It provides your doctor with information about the size and shape of your heart and how well your heart's chambers and valves are working. This procedure takes approximately one hour. There are no restrictions for this procedure.   Follow-Up: At [facility], you and your health needs are our priority.  As part of our continuing mission to provide you with exceptional heart care, we have created designated Provider Care Teams.  These Care Teams include your primary Cardiologist (physician) and Advanced Practice Providers (APPs -  Physician Assistants and Nurse Practitioners) who all work together to provide you with the care you need, when you need it.  We recommend signing up for the patient portal called "MyChart".  Sign up information is provided on this After Visit Summary.  MyChart is used to connect with patients for Virtual Visits (Telemedicine).  Patients are able to view lab/test results, encounter notes, upcoming appointments, etc.  Non-urgent messages can be sent to your provider as well.   To learn more about what you can do with MyChart, go to [email or web address].    Your next  appointment:   After Echocardiogram with Ermalinda Barrios, PA-C

## 2020-10-11 LAB — COMPREHENSIVE METABOLIC PANEL
ALT: 15 IU/L (ref 0–32)
AST: 17 IU/L (ref 0–40)
Albumin/Globulin Ratio: 1.7 (ref 1.2–2.2)
Albumin: 4.3 g/dL (ref 3.7–4.7)
Alkaline Phosphatase: 92 IU/L (ref 44–121)
BUN/Creatinine Ratio: 13 (ref 12–28)
BUN: 13 mg/dL (ref 8–27)
Bilirubin Total: 0.3 mg/dL (ref 0.0–1.2)
CO2: 25 mmol/L (ref 20–29)
Calcium: 9.4 mg/dL (ref 8.7–10.3)
Chloride: 100 mmol/L (ref 96–106)
Creatinine, Ser: 0.99 mg/dL (ref 0.57–1.00)
GFR calc Af Amer: 64 mL/min/{1.73_m2} (ref >59)
GFR calc non Af Amer: 55 mL/min/{1.73_m2} — ABNORMAL LOW (ref >59)
Globulin, Total: 2.6 g/dL (ref 1.5–4.5)
Glucose: 97 mg/dL (ref 65–99)
Potassium: 4.7 mmol/L (ref 3.5–5.2)
Sodium: 139 mmol/L (ref 134–144)
Total Protein: 6.9 g/dL (ref 6.0–8.5)

## 2020-10-11 LAB — CBC
Hematocrit: 43 % (ref 34.0–46.6)
Hemoglobin: 14.2 g/dL (ref 11.1–15.9)
MCH: 29.8 pg (ref 26.6–33.0)
MCHC: 33 g/dL (ref 31.5–35.7)
MCV: 90 fL (ref 79–97)
Platelets: 324 10*3/uL (ref 150–450)
RBC: 4.76 x10E6/uL (ref 3.77–5.28)
RDW: 14.5 % (ref 11.7–15.4)
WBC: 8.5 10*3/uL (ref 3.4–10.8)

## 2020-10-11 LAB — LIPID PANEL
Chol/HDL Ratio: 3.9 ratio (ref 0.0–4.4)
Cholesterol, Total: 239 mg/dL — ABNORMAL HIGH (ref 100–199)
HDL: 62 mg/dL (ref >39)
LDL Chol Calc (NIH): 152 mg/dL — ABNORMAL HIGH (ref 0–99)
Triglycerides: 140 mg/dL (ref 0–149)
VLDL Cholesterol Cal: 25 mg/dL (ref 5–40)

## 2020-10-11 LAB — TSH: TSH: 4.36 u[IU]/mL (ref 0.450–4.500)

## 2020-10-12 ENCOUNTER — Ambulatory Visit: Payer: PPO

## 2020-10-13 DIAGNOSIS — H5213 Myopia, bilateral: Secondary | ICD-10-CM | POA: Diagnosis not present

## 2020-10-13 DIAGNOSIS — H401423 Capsular glaucoma with pseudoexfoliation of lens, left eye, severe stage: Secondary | ICD-10-CM | POA: Diagnosis not present

## 2020-10-13 DIAGNOSIS — H2513 Age-related nuclear cataract, bilateral: Secondary | ICD-10-CM | POA: Diagnosis not present

## 2020-10-24 ENCOUNTER — Ambulatory Visit: Payer: PPO | Admitting: Family Medicine

## 2020-10-27 ENCOUNTER — Other Ambulatory Visit: Payer: Self-pay | Admitting: Cardiology

## 2020-10-30 ENCOUNTER — Other Ambulatory Visit: Payer: Self-pay

## 2020-10-30 ENCOUNTER — Ambulatory Visit (HOSPITAL_COMMUNITY): Payer: PPO | Attending: Cardiology

## 2020-10-30 DIAGNOSIS — I428 Other cardiomyopathies: Secondary | ICD-10-CM | POA: Insufficient documentation

## 2020-10-30 LAB — ECHOCARDIOGRAM COMPLETE
Area-P 1/2: 2.55 cm2
P 1/2 time: 505 msec
S' Lateral: 3.4 cm

## 2020-11-06 ENCOUNTER — Other Ambulatory Visit: Payer: Self-pay | Admitting: Family Medicine

## 2020-11-06 ENCOUNTER — Ambulatory Visit: Payer: PPO | Admitting: Family Medicine

## 2020-11-08 ENCOUNTER — Telehealth: Payer: Self-pay

## 2020-11-08 NOTE — Telephone Encounter (Signed)
Tried to contact pt, left several messages regarding test results. No call back. Letter sent via MyChart 11/16  Labs all look good except LDL high 152 total chol 239. Increase pravachol 20 mg daily. Repeat FLP and LFT's in 4 months. Reduce saturated fats and processed foods in diet. thanks   Heart function actually normal and improved from last year. Heart has trouble relaxing and right heart similar to before. If patient still having problems we can schedule GXT myoview. Otherwise continue current treatment. thanks

## 2020-11-14 ENCOUNTER — Other Ambulatory Visit: Payer: Self-pay | Admitting: Family Medicine

## 2020-11-27 NOTE — Progress Notes (Signed)
Cardiology Office Note    Date:  11/29/2020   ID:  Jessica Mcneil, DOB Dec 29, 1940, MRN [id number]  PCP:  Eulas Post, MD  Cardiologist: Ena Dawley, MD EPS: None  No chief complaint on file.   History of Present Illness:  Jessica Mcneil is a 79 y.o. female with history of NICM with normal coronary arteries, EF 50%  2007 on cath, LVEF 35-40% on echo. . Also has HLD    I saw the patient 10/10/2020 with increased dyspnea on exertion and decreased stamina over the past year.  Repeat echo showed improved LVEF 50 to 55% with grade 1 DD and RV systolic function was moderately reduced-similar to prior echo. LDL Also high it 151 and I increase Pravachol to 20 mg daily.  She has had trouble with higher doses in the past.  Patient comes in for f/u. Still has chronic DOE. Can do activity and rest and recovers quickly. Smoked for over 20 yrs but quit 40 yrs ago. Wheezes on occasion. Hasn't had CXR since 2014. Tolerating higher dose pravachol.  Past Medical History:  Diagnosis Date  . Anxiety   . Arthritis   . CHF (congestive heart failure) ([place])    due to non ischemic cardiomyopathy  . Depression   . GERD (gastroesophageal reflux disease)   . Headache(784.0)    migraines  . HLD (hyperlipidemia)   . HTN (hypertension)   . Overweight(278.02)   . Pneumonia   . PONV (postoperative nausea and vomiting)    long time ago none recent  . 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 Shortness of breath     Past Surgical History:  Procedure Laterality Date  . ANTERIOR AND POSTERIOR REPAIR  2003   bladder  . APPENDECTOMY    . BELPHAROPTOSIS REPAIR     bilat  . BREAST BIOPSY Right   . BREAST LUMPECTOMY WITH NEEDLE LOCALIZATION Right 02/12/2013   Procedure: BREAST LUMPECTOMY WITH NEEDLE LOCALIZATION;  Surgeon: Merrie Roof, MD;  Location: [facility];  Service: General;  Laterality: Right;  . CHOLECYSTECTOMY    . COLONOSCOPY    . EYE  SURGERY     Glaucoma Implant  . TONSILLECTOMY    . TOTAL SHOULDER ARTHROPLASTY Right 12/09/2017   Procedure: REVERSE TOTAL SHOULDER ARTHROPLASTY;  Surgeon: Marchia Bond, MD;  Location: [facility];  Service: Orthopedics;  Laterality: Right;  [name] VAGINAL HYSTERECTOMY  1998    Current Medications: Current Meds  Medication Sig  . acetaminophen (TYLENOL) 500 MG tablet Take 500 mg by mouth. Patient reports taking 1 to 2 tablets a day if needed.  [name] allopurinol (ZYLOPRIM) 300 MG tablet TAKE 1 TABLET BY MOUTH EVERY DAY  . ALPRAZolam (XANAX) 0.5 MG tablet TAKE 1 TABLET BY MOUTH AT  BEDTIME AS NEEDED FOR  SLEEP/ANXIETY  . aspirin EC 81 MG tablet Take 81 mg by mouth daily.  [name] CALCIUM-MAGNESIUM-ZINC PO Take 1 tablet by mouth daily.  . colchicine 0.6 MG tablet Take 0.6 mg by mouth as needed (gout flare ups).  . docusate sodium (COLACE) 100 MG capsule Take 200 mg by mouth at bedtime.  [name] FLUoxetine (PROZAC) 20 MG capsule TAKE 2 CAPSULES BY MOUTH EVERY DAY  . hydrochlorothiazide (MICROZIDE) 12.5 MG capsule Take 1 capsule (12.5 mg total) by mouth daily as needed (Swelling).  [name] HYDROcodone-acetaminophen (NORCO) 10-325 MG tablet Take 1-2 tablets by mouth every 6 (six) hours as needed.  . latanoprost (XALATAN) 0.005 % ophthalmic solution Place 1  drop into the right eye at bedtime.  . metoprolol succinate (TOPROL-XL) 50 MG 24 hr tablet Take with or immediately following a meal.  . Multiple Vitamin (MULTIVITAMIN WITH MINERALS) TABS Take 1 tablet by mouth daily.  . Omega-3 Fatty Acids 1200 MG CAPS Take 1,200 mg by mouth daily.  [name] omeprazole (PRILOSEC) 20 MG capsule TAKE 1 CAPSULE BY MOUTH EVERY DAY  . pravastatin (PRAVACHOL) 10 MG tablet Take 1 tablet (10 mg total) by mouth every evening.  . Probiotic Product (PROBIOTIC ADVANCED PO) Take by mouth daily.  [name] spironolactone (ALDACTONE) 25 MG tablet Take 0.5 tablets (12.5 mg total) by mouth daily.  . traMADol (ULTRAM) 50 MG tablet Take 50 mg by mouth every 6 (six) hours  as needed for moderate pain.  . [DISCONTINUED] spironolactone (ALDACTONE) 25 MG tablet PLEASE CALL TO SCHEDULE APPOINTMENT FOR FUTURE REFILLS. THANK YOU!     Allergies:   Lipitor [atorvastatin], Codeine, Erythromycin, and Sulfamethoxazole   Social History   Socioeconomic History  . Marital status: Divorced    Spouse name: Not on file  . Number of children: Not on file  . Years of education: Not on file  . Highest education level: Not on file  Occupational History  . Occupation: retired  Tobacco Use  . Smoking status: Former Smoker    Packs/day: 2.00    Types: Cigarettes    Quit date: 12/23/1982    Years since quitting: 37.9  . Smokeless tobacco: Never Used  Vaping Use  . Vaping Use: Never used  Substance and Sexual Activity  . Alcohol use: No  . Drug use: No  . Sexual activity: Not on file  Other Topics Concern  . Not on file  Social History Narrative  . Not on file   Social Determinants of Health   Financial Resource Strain:   . Difficulty of Paying Living Expenses: Not on file  Food Insecurity:   . Worried About Charity fundraiser in the Last Year: Not on file  . Ran Out of Food in the Last Year: Not on file  Transportation Needs:   . Lack of Transportation (Medical): Not on file  . Lack of Transportation (Non-Medical): Not on file  Physical Activity:   . Days of Exercise per Week: Not on file  . Minutes of Exercise per Session: Not on file  Stress:   . Feeling of Stress : Not on file  Social Connections:   . Frequency of Communication with Friends and Family: Not on file  . Frequency of Social Gatherings with Friends and Family: Not on file  . Attends Religious Services: Not on file  . Active Member of Clubs or Organizations: Not on file  . Attends Archivist Meetings: Not on file  . Marital Status: Not on file     Family History:  The patient's family history includes Arthritis in an other family member; Heart disease in her father;  Hyperlipidemia in an other family member.   ROS:   Please see the history of present illness.    ROS All other systems reviewed and are negative.   PHYSICAL EXAM:   VS:  BP 100/60   Pulse 80   Ht 5\' 7"  (1.702 m)   Wt 166 lb 3.2 oz (75.4 kg)   SpO2 96%   BMI 26.03 kg/m   Physical Exam  GEN: Well nourished, well developed, in no acute distress  Neck: no JVD, carotid bruits, or masses Cardiac:RRR; no murmurs, rubs, or gallops  Respiratory: Decreased breath sounds with basilar crackles  GI: soft, nontender, nondistended, + BS Ext: without cyanosis, clubbing, or edema, Good distal pulses bilaterally Neuro:  Alert and Oriented x 3 Psych: euthymic mood, full affect  Wt Readings from Last 3 Encounters:  11/29/20 166 lb 3.2 oz (75.4 kg)  10/10/20 164 lb (74.4 kg)  03/07/20 167 lb 14.4 oz (76.2 kg)      Studies/Labs Reviewed:   EKG:  EKG is not ordered today.   Recent Labs: 10/10/2020: ALT 15; BUN 13; Creatinine, Ser 0.99; Hemoglobin 14.2; Platelets 324; Potassium 4.7; Sodium 139; TSH 4.360   Lipid Panel    Component Value Date/Time   CHOL 239 (H) 10/10/2020 0830   TRIG 140 10/10/2020 0830   HDL 62 10/10/2020 0830   CHOLHDL 3.9 10/10/2020 0830   CHOLHDL 5 02/17/2019 1443   VLDL 34.2 02/17/2019 1443   LDLCALC 152 (H) 10/10/2020 0830   LDLDIRECT 216.7 06/05/2011 1105    Additional studies/ records that were reviewed today include:  Echo 10/30/20 IMPRESSIONS     1. Left ventricular ejection fraction, by estimation, is 50 to 55%. The  left ventricle has low normal function. The left ventricle has no regional  wall motion abnormalities. Left ventricular diastolic parameters are  consistent with Grade I diastolic  dysfunction (impaired relaxation). The average left ventricular global  longitudinal strain is -20.4 %.   2. Right ventricular systolic function is moderately reduced. The right  ventricular size is normal. There is normal pulmonary artery systolic  pressure.  The estimated right ventricular systolic pressure is 19.1 mmHg.   3. The mitral valve is normal in structure. Trivial mitral valve  regurgitation. No evidence of mitral stenosis.   4. The aortic valve is tricuspid. Aortic valve regurgitation is mild.  Mild aortic valve sclerosis is present, with no evidence of aortic valve  stenosis.   5. The inferior vena cava is normal in size with greater than 50%  respiratory variability, suggesting right atrial pressure of 3 mmHg.      ASSESSMENT:    1. NICM (nonischemic cardiomyopathy) ([place])   2. Chronic systolic CHF (congestive heart failure) ([place])   3. Essential hypertension   4. Hyperlipidemia, unspecified hyperlipidemia type   5. Shortness of breath      PLAN:  In order of problems listed above:  Nonischemic cardiomyopathy EF 45 to 50% 09/29/2019.  Follow-up echo 10/30/2020 LVEF 50 to 55% with grade 1 DD, and RV systolic function moderately reduced similar to prior echo.  Chronic systolic CHF with worsening dyspnea on exertion last office visit with follow-up echo showing normal LVEF but moderately reduced RV systolic function-no change.  Will refer to pulmonary with history of smoking.  Essential hypertension BP running low but asymptomatic. Refill spironolactone.  HLD was high at 151.  I asked her to increase her Pravachol to 20 mg daily.  She has been unable to tolerate higher doses in the past but so far tolerating well.  Shortness of Breath-refer to pulmonary with smoking history.  Medication Adjustments/Labs and Tests Ordered: Current medicines are reviewed at length with the patient today.  Concerns regarding medicines are outlined above.  Medication changes, Labs and Tests ordered today are listed in the Patient Instructions below. There are no Patient Instructions on file for this visit.   Signed, Ermalinda Barrios, PA-C  11/29/2020 8:10 AM    [facility] [place], [place], [place]  [address] Phone:  [phone]; Fax: [phone]

## 2020-11-29 ENCOUNTER — Other Ambulatory Visit: Payer: Self-pay

## 2020-11-29 ENCOUNTER — Encounter: Payer: Self-pay | Admitting: Physician Assistant

## 2020-11-29 ENCOUNTER — Ambulatory Visit (INDEPENDENT_AMBULATORY_CARE_PROVIDER_SITE_OTHER): Payer: PPO | Admitting: Physician Assistant

## 2020-11-29 VITALS — BP 100/60 | HR 80 | Ht 67.0 in | Wt 166.2 lb

## 2020-11-29 DIAGNOSIS — E785 Hyperlipidemia, unspecified: Secondary | ICD-10-CM | POA: Diagnosis not present

## 2020-11-29 DIAGNOSIS — I428 Other cardiomyopathies: Secondary | ICD-10-CM | POA: Diagnosis not present

## 2020-11-29 DIAGNOSIS — I5022 Chronic systolic (congestive) heart failure: Secondary | ICD-10-CM

## 2020-11-29 DIAGNOSIS — R0602 Shortness of breath: Secondary | ICD-10-CM | POA: Diagnosis not present

## 2020-11-29 DIAGNOSIS — I10 Essential (primary) hypertension: Secondary | ICD-10-CM

## 2020-11-29 MED ORDER — SPIRONOLACTONE 25 MG PO TABS
12.5000 mg | ORAL_TABLET | Freq: Every day | ORAL | 3 refills | Status: DC
Start: 2020-11-29 — End: 2021-06-29

## 2020-11-29 NOTE — Patient Instructions (Signed)
Medication Instructions:  Your physician recommends that you continue on your current medications as directed. Please refer to the Current Medication list given to you today.  *If you need a refill on your cardiac medications before your next appointment, please call your pharmacy*   Lab Work: None If you have labs (blood work) drawn today and your tests are completely normal, you will receive your results only by: [name] MyChart Message (if you have MyChart) OR . A paper copy in the mail If you have any lab test that is abnormal or we need to change your treatment, we will call you to review the results.   Follow-Up: At [facility], you and your health needs are our priority.  As part of our continuing mission to provide you with exceptional heart care, we have created designated Provider Care Teams.  These Care Teams include your primary Cardiologist (physician) and Advanced Practice Providers (APPs -  Physician Assistants and Nurse Practitioners) who all work together to provide you with the care you need, when you need it.  We recommend signing up for the patient portal called "MyChart".  Sign up information is provided on this After Visit Summary.  MyChart is used to connect with patients for Virtual Visits (Telemedicine).  Patients are able to view lab/test results, encounter notes, upcoming appointments, etc.  Non-urgent messages can be sent to your provider as well.   To learn more about what you can do with MyChart, go to [email or web address].    Your next appointment:   6 month(s)  The format for your next appointment:   In Person  Provider:   You may see Ena Dawley, MD or one of the following Advanced Practice Providers on your designated Care Team:    Melina Copa, PA-C  Ermalinda Barrios, PA-C

## 2020-12-26 ENCOUNTER — Ambulatory Visit: Payer: PPO

## 2020-12-27 ENCOUNTER — Ambulatory Visit (INDEPENDENT_AMBULATORY_CARE_PROVIDER_SITE_OTHER): Payer: PPO

## 2020-12-27 ENCOUNTER — Other Ambulatory Visit: Payer: Self-pay

## 2020-12-27 DIAGNOSIS — Z0000 Encounter for general adult medical examination without abnormal findings: Secondary | ICD-10-CM

## 2020-12-27 NOTE — Patient Instructions (Addendum)
Ms. Jessica Mcneil , Thank you for taking time to come for your Medicare Wellness Visit. I appreciate your ongoing commitment to your health goals. Please review the following plan we discussed and let me know if I can assist you in the future.   Screening recommendations/referrals: Colonoscopy: Done 07/05/13 Mammogram: Done 06/19/20 Bone Density: Done 09/08/19 Recommended yearly ophthalmology/optometry visit for glaucoma screening and checkup Recommended yearly dental visit for hygiene and checkup  Vaccinations: Influenza vaccine: Done 09/27/20 Pneumococcal vaccine: Up to date Tdap vaccine: Up to date Shingles vaccine: Shingrix discussed. Please contact your pharmacy for coverage information.    Covid-19:Completed 2/7 & 02/24/20 and 09/21/20  Advanced directives: Please bring a copy of your health care power of attorney and living will to the office at your convenience.  Conditions/risks identified: Get off some medications  Next appointment: Follow up in one year for your annual wellness visit    Preventive Care 65 Years and Older, Female Preventive care refers to lifestyle choices and visits with your health care provider that can promote health and wellness. What does preventive care include?  A yearly physical exam. This is also called an annual well check.  Dental exams once or twice a year.  Routine eye exams. Ask your health care provider how often you should have your eyes checked.  Personal lifestyle choices, including:  Daily care of your teeth and gums.  Regular physical activity.  Eating a healthy diet.  Avoiding tobacco and drug use.  Limiting alcohol use.  Practicing safe sex.  Taking low-dose aspirin every day.  Taking vitamin and mineral supplements as recommended by your health care provider. What happens during an annual well check? The services and screenings done by your health care provider during your annual well check will depend on your age, overall  health, lifestyle risk factors, and family history of disease. Counseling  Your health care provider may ask you questions about your:  Alcohol use.  Tobacco use.  Drug use.  Emotional well-being.  Home and relationship well-being.  Sexual activity.  Eating habits.  History of falls.  Memory and ability to understand (cognition).  Work and work Astronomer.  Reproductive health. Screening  You may have the following tests or measurements:  Height, weight, and BMI.  Blood pressure.  Lipid and cholesterol levels. These may be checked every 5 years, or more frequently if you are over 35 years old.  Skin check.  Lung cancer screening. You may have this screening every year starting at age 15 if you have a 30-pack-year history of smoking and currently smoke or have quit within the past 15 years.  Fecal occult blood test (FOBT) of the stool. You may have this test every year starting at age 58.  Flexible sigmoidoscopy or colonoscopy. You may have a sigmoidoscopy every 5 years or a colonoscopy every 10 years starting at age 38.  Hepatitis C blood test.  Hepatitis B blood test.  Sexually transmitted disease (STD) testing.  Diabetes screening. This is done by checking your blood sugar (glucose) after you have not eaten for a while (fasting). You may have this done every 1-3 years.  Bone density scan. This is done to screen for osteoporosis. You may have this done starting at age 41.  Mammogram. This may be done every 1-2 years. Talk to your health care provider about how often you should have regular mammograms. Talk with your health care provider about your test results, treatment options, and if necessary, the need for more tests.  Vaccines  Your health care provider may recommend certain vaccines, such as:  Influenza vaccine. This is recommended every year.  Tetanus, diphtheria, and acellular pertussis (Tdap, Td) vaccine. You may need a Td booster every 10  years.  Zoster vaccine. You may need this after age 11.  Pneumococcal 13-valent conjugate (PCV13) vaccine. One dose is recommended after age 54.  Pneumococcal polysaccharide (PPSV23) vaccine. One dose is recommended after age 33. Talk to your health care provider about which screenings and vaccines you need and how often you need them. This information is not intended to replace advice given to you by your health care provider. Make sure you discuss any questions you have with your health care provider. Document Released: 01/05/2016 Document Revised: 08/28/2016 Document Reviewed: 10/10/2015 Elsevier Interactive Patient Education  2017 [employer] Prevention in the Home Falls can cause injuries. They can happen to people of all ages. There are many things you can do to make your home safe and to help prevent falls. What can I do on the outside of my home?  Regularly fix the edges of walkways and driveways and fix any cracks.  Remove anything that might make you trip as you walk through a door, such as a raised step or threshold.  Trim any bushes or trees on the path to your home.  Use bright outdoor lighting.  Clear any walking paths of anything that might make someone trip, such as rocks or tools.  Regularly check to see if handrails are loose or broken. Make sure that both sides of any steps have handrails.  Any raised decks and porches should have guardrails on the edges.  Have any leaves, snow, or ice cleared regularly.  Use sand or salt on walking paths during winter.  Clean up any spills in your garage right away. This includes oil or grease spills. What can I do in the bathroom?  Use night lights.  Install grab bars by the toilet and in the tub and shower. Do not use towel bars as grab bars.  Use non-skid mats or decals in the tub or shower.  If you need to sit down in the shower, use a plastic, non-slip stool.  Keep the floor dry. Clean up any water that  spills on the floor as soon as it happens.  Remove soap buildup in the tub or shower regularly.  Attach bath mats securely with double-sided non-slip rug tape.  Do not have throw rugs and other things on the floor that can make you trip. What can I do in the bedroom?  Use night lights.  Make sure that you have a light by your bed that is easy to reach.  Do not use any sheets or blankets that are too big for your bed. They should not hang down onto the floor.  Have a firm chair that has side arms. You can use this for support while you get dressed.  Do not have throw rugs and other things on the floor that can make you trip. What can I do in the kitchen?  Clean up any spills right away.  Avoid walking on wet floors.  Keep items that you use a lot in easy-to-reach places.  If you need to reach something above you, use a strong step stool that has a grab bar.  Keep electrical cords out of the way.  Do not use floor polish or wax that makes floors slippery. If you must use wax, use non-skid floor wax.  Do not have throw rugs and other things on the floor that can make you trip. What can I do with my stairs?  Do not leave any items on the stairs.  Make sure that there are handrails on both sides of the stairs and use them. Fix handrails that are broken or loose. Make sure that handrails are as long as the stairways.  Check any carpeting to make sure that it is firmly attached to the stairs. Fix any carpet that is loose or worn.  Avoid having throw rugs at the top or bottom of the stairs. If you do have throw rugs, attach them to the floor with carpet tape.  Make sure that you have a light switch at the top of the stairs and the bottom of the stairs. If you do not have them, ask someone to add them for you. What else can I do to help prevent falls?  Wear shoes that:  Do not have high heels.  Have rubber bottoms.  Are comfortable and fit you well.  Are closed at the  toe. Do not wear sandals.  If you use a stepladder:  Make sure that it is fully opened. Do not climb a closed stepladder.  Make sure that both sides of the stepladder are locked into place.  Ask someone to hold it for you, if possible.  Clearly mark and make sure that you can see:  Any grab bars or handrails.  First and last steps.  Where the edge of each step is.  Use tools that help you move around (mobility aids) if they are needed. These include:  Canes.  Walkers.  Scooters.  Crutches.  Turn on the lights when you go into a dark area. Replace any light bulbs as soon as they burn out.  Set up your furniture so you have a clear path. Avoid moving your furniture around.  If any of your floors are uneven, fix them.  If there are any pets around you, be aware of where they are.  Review your medicines with your doctor. Some medicines can make you feel dizzy. This can increase your chance of falling. Ask your doctor what other things that you can do to help prevent falls. This information is not intended to replace advice given to you by your health care provider. Make sure you discuss any questions you have with your health care provider. Document Released: 10/05/2009 Document Revised: 05/16/2016 Document Reviewed: 01/13/2015 Elsevier Interactive Patient Education  2017 [employer].

## 2020-12-27 NOTE — Progress Notes (Signed)
Virtual Visit via Telephone Note  I connected with  [name] on 12/27/20 at  3:15 PM EST by telephone and verified that I am speaking with the correct person using two identifiers.  Medicare Annual Wellness visit completed telephonically due to Covid-19 pandemic.   Persons participating in this call: This Health Coach and this patient.   Location: Patient: Home Provider: Office   I discussed the limitations, risks, security and privacy concerns of performing an evaluation and management service by telephone and the availability of in person appointments. The patient expressed understanding and agreed to proceed.  Unable to perform video visit due to video visit attempted and failed and/or patient does not have video capability.   Some vital signs may be absent or patient reported.   Willette Brace, LPN    Subjective:   Gayleen Blankley is a 80 y.o. female who presents for an Initial Medicare Annual Wellness Visit.  Review of Systems     Cardiac Risk Factors include: advanced age (>48men, >44 women);dyslipidemia;hypertension     Objective:    There were no vitals filed for this visit. There is no height or weight on file to calculate BMI.  Advanced Directives 12/27/2020 12/04/2017 12/29/2014 12/29/2014 02/01/2013  Does Patient Have a Medical Advance Directive? Yes Yes No No Patient has advance directive, copy not in chart  Type of Advance Directive [place];Living will [place];Living will - - Living will;Healthcare Power of Attorney  Does patient want to make changes to medical advance directive? - No - Patient declined - - -  Copy of [place] in Chart? No - copy requested Yes - - -  Would patient like information on creating a medical advance directive? - - No - patient declined information No - patient declined information -    Current Medications (verified) Outpatient Encounter Medications as  of 12/27/2020  Medication Sig  . acetaminophen (TYLENOL) 500 MG tablet Take 500 mg by mouth. Patient reports taking 1 to 2 tablets a day if needed.  [name] allopurinol (ZYLOPRIM) 300 MG tablet TAKE 1 TABLET BY MOUTH EVERY DAY  . ALPRAZolam (XANAX) 0.5 MG tablet TAKE 1 TABLET BY MOUTH AT  BEDTIME AS NEEDED FOR  SLEEP/ANXIETY  . aspirin EC 81 MG tablet Take 81 mg by mouth daily.  [name] CALCIUM-MAGNESIUM-ZINC PO Take 1 tablet by mouth daily.  [name] docusate sodium (COLACE) 100 MG capsule Take 200 mg by mouth at bedtime.  [name] FLUoxetine (PROZAC) 20 MG capsule TAKE 2 CAPSULES BY MOUTH EVERY DAY  . HYDROcodone-acetaminophen (NORCO) 10-325 MG tablet Take 1-2 tablets by mouth every 6 (six) hours as needed.  . latanoprost (XALATAN) 0.005 % ophthalmic solution Place 1 drop into the right eye at bedtime.  . metoprolol succinate (TOPROL-XL) 50 MG 24 hr tablet Take with or immediately following a meal.  . Multiple Vitamin (MULTIVITAMIN WITH MINERALS) TABS Take 1 tablet by mouth daily.  . Omega-3 Fatty Acids 1200 MG CAPS Take 1,200 mg by mouth daily.  [name] omeprazole (PRILOSEC) 20 MG capsule TAKE 1 CAPSULE BY MOUTH EVERY DAY  . pravastatin (PRAVACHOL) 10 MG tablet Take 1 tablet (10 mg total) by mouth every evening.  . Probiotic Product (PROBIOTIC ADVANCED PO) Take by mouth daily.  [name] spironolactone (ALDACTONE) 25 MG tablet Take 0.5 tablets (12.5 mg total) by mouth daily.  . colchicine 0.6 MG tablet Take 0.6 mg by mouth as needed (gout flare ups). (Patient not taking: Reported on 12/27/2020)  .  hydrochlorothiazide (MICROZIDE) 12.5 MG capsule Take 1 capsule (12.5 mg total) by mouth daily as needed (Swelling). (Patient not taking: Reported on 12/27/2020)  . traMADol (ULTRAM) 50 MG tablet Take 50 mg by mouth every 6 (six) hours as needed for moderate pain. (Patient not taking: Reported on 12/27/2020)   No facility-administered encounter medications on file as of 12/27/2020.    Allergies (verified) Lipitor [atorvastatin], Codeine,  Erythromycin, and Sulfamethoxazole   History: Past Medical History:  Diagnosis Date  . Anxiety   . Arthritis   . CHF (congestive heart failure) (HCC)    due to non ischemic cardiomyopathy  . Depression   . GERD (gastroesophageal reflux disease)   . Headache(784.0)    migraines  . HLD (hyperlipidemia)   . HTN (hypertension)   . Overweight(278.02)   . Pneumonia   . PONV (postoperative nausea and vomiting)    long time ago none recent  . 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 Shortness of breath    Past Surgical History:  Procedure Laterality Date  . ANTERIOR AND POSTERIOR REPAIR  2003   bladder  . APPENDECTOMY    . BELPHAROPTOSIS REPAIR     bilat  . BREAST BIOPSY Right   . BREAST LUMPECTOMY WITH NEEDLE LOCALIZATION Right 02/12/2013   Procedure: BREAST LUMPECTOMY WITH NEEDLE LOCALIZATION;  Surgeon: Robyne Askew, MD;  Location: [facility];  Service: General;  Laterality: Right;  . CHOLECYSTECTOMY    . COLONOSCOPY    . EYE SURGERY     Glaucoma Implant  . TONSILLECTOMY    . TOTAL SHOULDER ARTHROPLASTY Right 12/09/2017   Procedure: REVERSE TOTAL SHOULDER ARTHROPLASTY;  Surgeon: Teryl Lucy, MD;  Location: [facility];  Service: Orthopedics;  Laterality: Right;  [name] VAGINAL HYSTERECTOMY  1998   Family History  Problem Relation Age of Onset  . Heart disease Father   . Arthritis Other   . Hyperlipidemia Other   . Breast cancer Neg Hx    Social History   Socioeconomic History  . Marital status: Divorced    Spouse name: Not on file  . Number of children: Not on file  . Years of education: Not on file  . Highest education level: Not on file  Occupational History  . Occupation: retired  Tobacco Use  . Smoking status: Former Smoker    Packs/day: 2.00    Types: Cigarettes    Quit date: 12/23/1982    Years since quitting: 38.0  . Smokeless tobacco: Never Used  Vaping Use  . Vaping Use: Never used  Substance and Sexual Activity  . Alcohol use: No   . Drug use: No  . Sexual activity: Not on file  Other Topics Concern  . Not on file  Social History Narrative  . Not on file   Social Determinants of Health   Financial Resource Strain: Low Risk   . Difficulty of Paying Living Expenses: Not hard at all  Food Insecurity: No Food Insecurity  . Worried About Programme researcher, broadcasting/film/video in the Last Year: Never true  . Ran Out of Food in the Last Year: Never true  Transportation Needs: No Transportation Needs  . Lack of Transportation (Medical): No  . Lack of Transportation (Non-Medical): No  Physical Activity: Insufficiently Active  . Days of Exercise per Week: 7 days  . Minutes of Exercise per Session: 20 min  Stress: No Stress Concern Present  . Feeling of Stress : Only a little  Social Connections: Socially Isolated  .  Frequency of Communication with Friends and Family: Twice a week  . Frequency of Social Gatherings with Friends and Family: Three times a week  . Attends Religious Services: Never  . Active Member of Clubs or Organizations: No  . Attends Archivist Meetings: Never  . Marital Status: Divorced    Tobacco Counseling Counseling given: Not Answered   Clinical Intake:  Pre-visit preparation completed: Yes  Pain : No/denies pain     Nutritional Risks: None Diabetes: No  How often do you need to have someone help you when you read instructions, pamphlets, or other written materials from your doctor or pharmacy?: 1 - Never  Diabetic?No  Interpreter Needed?: No  Information entered by :: Charlott Rakes LPN   Activities of Daily Living In your present state of health, do you have any difficulty performing the following activities: 12/27/2020  Hearing? Y  Comment mild loss  Vision? N  Difficulty concentrating or making decisions? Y  Comment memory at times  Walking or climbing stairs? N  Dressing or bathing? N  Doing errands, shopping? N  Preparing Food and eating ? N  Using the Toilet? N  In the  past six months, have you accidently leaked urine? Y  Comment urgency  Do you have problems with loss of bowel control? N  Managing your Medications? N  Managing your Finances? N  Housekeeping or managing your Housekeeping? N  Some recent data might be hidden    Patient Care Team: Eulas Post, MD as PCP - General (Family Medicine) Dorothy Spark, MD as PCP - Cardiology (Cardiology) Earnie Larsson, [facility] as Pharmacist (Pharmacist)  Indicate any recent Medical Services you may have received from other than [facility] providers in the past year (date may be approximate).     Assessment:   This is a routine wellness examination for Nimisha.  Hearing/Vision screen  Hearing Screening   125Hz  250Hz  500Hz  1000Hz  2000Hz  3000Hz  4000Hz  6000Hz  8000Hz   Right ear:           Left ear:           Comments: Mild loss hard to understand at times  Vision Screening Comments: Pt follow up with Dr Valetta Close for annual eye exams  Dietary issues and exercise activities discussed: Current Exercise Habits: The patient does not participate in regular exercise at present  Goals    . Patient Stated     Get off of some mediactions    . Pharmacy Care Plan     CARE PLAN ENTRY  Current Barriers:  . Chronic Disease Management support, education, and care coordination needs related to CHF, HTN, HLD, and Nonischemic cardiomyopathy, Migraine, constipation, GERD, Gout   Pharmacist Clinical Goal(s):  . Heart failure: Check weight daily and contact physician if weight gain of more than 3 pounds overnight or more than 5 pounds in a week. . Blood pressure:   [name] Maintain Blood pressure <130/80 mmHg  . High cholesterol:  . Cholesterol goals: Total Cholesterol goal under 200, Triglycerides goal under 150, HDL goal above 40 (men) or above 50 (women), LDL goal under 100.  . Osteoporosis . Prevent bone fractures and ensure daily intake of calcium and vitamin D.  . Anxiety: Continue to see improvement in anxiety  symptoms . Migraine: Continue to see improvement in pain level.  . Constipation: Continue to see improvement in bowel movements.  . Heart burn: Minimize reflux symptoms.   Interventions: . Comprehensive medication review performed. . Blood pressure:  . Discussed need  to continue checking blood pressure at home.  . Discussed diet modifications. DASH diet:  following a diet emphasizing fruits and vegetables and low-fat dairy products along with whole grains, fish, poultry, and nuts. Reducing red meats and sugars.  . Reducing the amount of salt intake to 1500mg /per day.  . Recommend using a salt substitute to replace your salt if you need flavor.      . Weight reduction- We discussed losing 5-10% of body weight . Continue: metoprolol succinate 50mg , 1 tablet once daily with or immediately following a meal and spironolactone 25mg , 0.5 tablet daily  . High cholesterol . How to reduce cholesterol through diet/weight management and physical activity.    . We discussed how a diet high in plant sterols (fruits/vegetables/nuts/whole grains/legumes) may reduce your cholesterol.  Encouraged increasing fiber to a daily intake of 10-25g/day  . Continue: pravastatin 10mg  every evening and omega-3 fatty acids 1200mg , 1 capsule daily.  . Osteoporosis . Discussed importance of adherence.  . Continue: alendronate 70mg , 1 tablet every 7 days.  . Gout . Eating a low-purine diet. Avoid foods and drinks such as: liver, kidney, anchovies, asparagus, herring, mushrooms, mussels, beer,etc. . Continue: allopurinol 300mg , 1 tablet daily and colchicine 0.6mg , 1 tablet daily as needed . Anxiety . Continue fluoxetine 20mg , 2 capsules daily and alprazolam 0.5mg , 1 tablet at bedtime as needed for sleep/anxiety.  . Migraine  . Continue: Tylenol 500mg , takes 1 to 2 tablets a day if needed and Tramadol 50 mg every 6 hours for pain  . Constipation . Continue: daily intake of probiotic.  [name] GERD: . Discussed  non-pharmacological interventions for acid reflux. Take measures to prevent acid reflux, such as avoiding spicy foods, avoiding caffeine, avoid laying down a few hours after eating, and raising the head of the bed. . Continue: omeprazole 20mg , 1 capsule daily  Patient Self Care Activities:  . Self administers medications as prescribed and Calls provider office for new concerns or questions . Continue current medications as directed by providers.  . Continue following up with specialists. . Continue at home blood pressure readings. . Continue working on health habits (diet/ exercise).  Initial goal documentation       Depression Screen PHQ 2/9 Scores 12/27/2020 09/03/2019 06/16/2015 02/02/2014  PHQ - 2 Score 1 4 0 0  PHQ- 9 Score - 14 - -    Fall Risk Fall Risk  12/27/2020 09/03/2019 06/16/2015 02/02/2014  Falls in the past year? 1 1 No No  Number falls in past yr: 1 1 - -  Injury with Fall? 1 1 - -  Comment cut eyebrow - - -  Risk for fall due to : Impaired balance/gait;Impaired vision - - -  Follow up Falls prevention discussed - - -    FALL RISK PREVENTION PERTAINING TO THE HOME:  Any stairs in or around the home? No  If so, are there any without handrails? No  Home free of loose throw rugs in walkways, pet beds, electrical cords, etc? Yes  Adequate lighting in your home to reduce risk of falls? Yes   ASSISTIVE DEVICES UTILIZED TO PREVENT FALLS:  Life alert? No  Use of a cane, walker or w/c? No  Grab bars in the bathroom? Yes  Shower chair or bench in shower? Yes  Elevated toilet seat or a handicapped toilet? Yes   TIMED UP AND GO:  Was the test performed? No .     Cognitive Function:     6CIT Screen 12/27/2020  What  Year? 0 points  What month? 0 points  Count back from 20 0 points  Months in reverse 0 points  Repeat phrase 0 points    Immunizations Immunization History  Administered Date(s) Administered  . Fluad Quad(high Dose 65+) 09/03/2019  . Influenza,  High Dose Seasonal PF 10/11/2014, 10/07/2017, 02/17/2019  . Influenza-Unspecified 09/04/2016, 10/07/2017, 09/27/2020  . PFIZER SARS-COV-2 Vaccination 01/30/2020, 02/24/2020, 09/21/2020  . Pneumococcal Conjugate-13 02/13/2016, 10/07/2017  . Pneumococcal Polysaccharide-23 12/23/2006  . Td 12/23/2004  . Tdap 02/13/2016    TDAP status: Up to date  Flu Vaccine status: Up to date  Pneumococcal vaccine status: Up to date  Covid-19 vaccine status: Completed vaccines  Qualifies for Shingles Vaccine? Yes   Zostavax completed No   Shingrix Completed?: No.    Education has been provided regarding the importance of this vaccine. Patient has been advised to call insurance company to determine out of pocket expense if they have not yet received this vaccine. Advised may also receive vaccine at local pharmacy or Health Dept. Verbalized acceptance and understanding.  Screening Tests Health Maintenance  Topic Date Due  . Hepatitis C Screening  Never done  . PNA vac Low Risk Adult (2 of 2 - PPSV23) 10/07/2018  . TETANUS/TDAP  02/12/2026  . INFLUENZA VACCINE  Completed  . DEXA SCAN  Completed  . COVID-19 Vaccine  Completed    Health Maintenance  Health Maintenance Due  Topic Date Due  . Hepatitis C Screening  Never done  . PNA vac Low Risk Adult (2 of 2 - PPSV23) 10/07/2018    Colorectal cancer screening: No longer required.   Mammogram status: Completed 06/19/20. Repeat every year  Bone Density status: Completed 09/08/19. Results reflect: Bone density results: OSTEOPENIA. Repeat every 2 years.   Additional Screening:  Hepatitis C Screening: does qualify  Vision Screening: Recommended annual ophthalmology exams for early detection of glaucoma and other disorders of the eye. Is the patient up to date with their annual eye exam?  Yes  Who is the provider or what is the name of the office in which the patient attends annual eye exams? Dr Valetta Close   Dental Screening: Recommended annual  dental exams for proper oral hygiene  Community Resource Referral / Chronic Care Management: CRR required this visit?  No   CCM required this visit?  No      Plan:     I have personally reviewed and noted the following in the patient's chart:   . Medical and social history . Use of alcohol, tobacco or illicit drugs  . Current medications and supplements . Functional ability and status . Nutritional status . Physical activity . Advanced directives . List of other physicians . Hospitalizations, surgeries, and ER visits in previous 12 months . Vitals . Screenings to include cognitive, depression, and falls . Referrals and appointments  In addition, I have reviewed and discussed with patient certain preventive protocols, quality metrics, and best practice recommendations. A written personalized care plan for preventive services as well as general preventive health recommendations were provided to patient.     Willette Brace, LPN   [id number]   Nurse Notes: None

## 2021-01-03 ENCOUNTER — Ambulatory Visit (INDEPENDENT_AMBULATORY_CARE_PROVIDER_SITE_OTHER): Payer: PPO

## 2021-01-03 ENCOUNTER — Encounter: Payer: Self-pay | Admitting: Pulmonary Disease

## 2021-01-03 ENCOUNTER — Other Ambulatory Visit: Payer: Self-pay

## 2021-01-03 ENCOUNTER — Ambulatory Visit: Payer: PPO | Admitting: Pulmonary Disease

## 2021-01-03 VITALS — BP 128/72 | HR 80 | Temp 98.2°F | Ht 67.0 in | Wt 166.0 lb

## 2021-01-03 DIAGNOSIS — R0609 Other forms of dyspnea: Secondary | ICD-10-CM

## 2021-01-03 DIAGNOSIS — R0600 Dyspnea, unspecified: Secondary | ICD-10-CM

## 2021-01-03 DIAGNOSIS — R0602 Shortness of breath: Secondary | ICD-10-CM | POA: Diagnosis not present

## 2021-01-03 MED ORDER — ALBUTEROL SULFATE HFA 108 (90 BASE) MCG/ACT IN AERS: 2.0000 | INHALATION_SPRAY | Freq: Four times a day (QID) | RESPIRATORY_TRACT | 6 refills | Status: DC | PRN

## 2021-01-03 NOTE — Progress Notes (Signed)
Synopsis: Referred in 12/2020 for shortness of breath by Ermalinda Barrios, PA  Subjective:   PATIENT ID: Jessica Mcneil GENDER: female DOB: 1941/09/01, MRN: [id number]   HPI  Chief Complaint  Patient presents with  . Consult    Referred by Ermalinda Barrios PA at cardiology for SOB associated with CHF. Had echos done in Oct 2020 and Nov 2021 and was told the echos had improved despite her SOB increasing.    Jessica Mcneil is a 80 year old woman, former smoker with nonischemic cardiomyopathy, hypertension, hyperlipidemia and GERD who is referred to pulmonary clinic for progressive dyspnea on exertion.   She reports having shortness of breath that has been progressing over the past several years. She has been following with cardiology for nonischemic cardiomyopathy with reduced EF that has improved upon echo surveillance to 50-55%. She does experience some wheezing at times when walking her dog. She also reports cough in the morning time. Her dyspnea is worse in cold weather but hot humid days are even harder. She does have sinus congestion and watery eyes due to seasonal allergies. She does report history of snoring and some days she does feel rested from sleep and others not so much. She does have afternoon fatigue and sleepiness. She does have history of GERD and is taking omeprazole with intermittent symptoms.    She smoked from age 74 to 55, where she smoked about a pack per day giving her a 34 pack year history. She was a lab tech previously. Denies harmful dust or chemical exposures.  Past Medical History:  Diagnosis Date  . Anxiety   . Arthritis   . CHF (congestive heart failure) ([place])    due to non ischemic cardiomyopathy  . Depression   . GERD (gastroesophageal reflux disease)   . Headache(784.0)    migraines  . HLD (hyperlipidemia)   . HTN (hypertension)   . Overweight(278.02)   . Pneumonia   . PONV (postoperative nausea and vomiting)    long time ago none recent  .  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 Shortness of breath      Family History  Problem Relation Age of Onset  . Heart disease Father   . Arthritis Other   . Hyperlipidemia Other   . Breast cancer Neg Hx      Social History   Socioeconomic History  . Marital status: Divorced    Spouse name: Not on file  . Number of children: Not on file  . Years of education: Not on file  . Highest education level: Not on file  Occupational History  . Occupation: retired  Tobacco Use  . Smoking status: Former Smoker    Packs/day: 2.00    Types: Cigarettes    Quit date: 12/23/1982    Years since quitting: 38.0  . Smokeless tobacco: Never Used  Vaping Use  . Vaping Use: Never used  Substance and Sexual Activity  . Alcohol use: No  . Drug use: No  . Sexual activity: Not on file  Other Topics Concern  . Not on file  Social History Narrative  . Not on file   Social Determinants of Health   Financial Resource Strain: Low Risk   . Difficulty of Paying Living Expenses: Not hard at all  Food Insecurity: No Food Insecurity  . Worried About Charity fundraiser in the Last Year: Never true  . Ran Out of Food in the Last Year: Never true  Transportation Needs: No Transportation  Needs  . Lack of Transportation (Medical): No  . Lack of Transportation (Non-Medical): No  Physical Activity: Insufficiently Active  . Days of Exercise per Week: 7 days  . Minutes of Exercise per Session: 20 min  Stress: No Stress Concern Present  . Feeling of Stress : Only a little  Social Connections: Socially Isolated  . Frequency of Communication with Friends and Family: Twice a week  . Frequency of Social Gatherings with Friends and Family: Three times a week  . Attends Religious Services: Never  . Active Member of Clubs or Organizations: No  . Attends Archivist Meetings: Never  . Marital Status: Divorced  Human resources officer Violence: Not At Risk  . Fear of Current or Ex-Partner: No  .  Emotionally Abused: No  . Physically Abused: No  . Sexually Abused: No     Allergies  Allergen Reactions  . Lipitor [Atorvastatin]     UNSPECIFIED REACTION   . Codeine Itching  . Erythromycin Nausea And Vomiting    GI UPSET  . Sulfamethoxazole Itching    Over 40 years ago     Outpatient Medications Prior to Visit  Medication Sig Dispense Refill  . acetaminophen (TYLENOL) 500 MG tablet Take 500 mg by mouth. Patient reports taking 1 to 2 tablets a day if needed.    [name] allopurinol (ZYLOPRIM) 300 MG tablet TAKE 1 TABLET BY MOUTH EVERY DAY 90 tablet 0  . ALPRAZolam (XANAX) 0.5 MG tablet TAKE 1 TABLET BY MOUTH AT  BEDTIME AS NEEDED FOR  SLEEP/ANXIETY 90 tablet 0  . aspirin EC 81 MG tablet Take 81 mg by mouth daily.    [name] CALCIUM-MAGNESIUM-ZINC PO Take 1 tablet by mouth daily.    [name] docusate sodium (COLACE) 100 MG capsule Take 200 mg by mouth at bedtime.    [name] FLUoxetine (PROZAC) 20 MG capsule TAKE 2 CAPSULES BY MOUTH EVERY DAY 180 capsule 0  . HYDROcodone-acetaminophen (NORCO) 10-325 MG tablet Take 1-2 tablets by mouth every 6 (six) hours as needed. 50 tablet 0  . latanoprost (XALATAN) 0.005 % ophthalmic solution Place 1 drop into the right eye at bedtime.    . metoprolol succinate (TOPROL-XL) 50 MG 24 hr tablet Take with or immediately following a meal. 90 tablet 3  . Multiple Vitamin (MULTIVITAMIN WITH MINERALS) TABS Take 1 tablet by mouth daily.    . Omega-3 Fatty Acids 1200 MG CAPS Take 1,200 mg by mouth daily.    [name] omeprazole (PRILOSEC) 20 MG capsule TAKE 1 CAPSULE BY MOUTH EVERY DAY 90 capsule 0  . pravastatin (PRAVACHOL) 10 MG tablet Take 1 tablet (10 mg total) by mouth every evening. 90 tablet 3  . Probiotic Product (PROBIOTIC ADVANCED PO) Take by mouth daily.    [name] spironolactone (ALDACTONE) 25 MG tablet Take 0.5 tablets (12.5 mg total) by mouth daily. 45 tablet 3  . colchicine 0.6 MG tablet Take 0.6 mg by mouth as needed (gout flare ups). (Patient not taking: Reported on 12/27/2020)     . hydrochlorothiazide (MICROZIDE) 12.5 MG capsule Take 1 capsule (12.5 mg total) by mouth daily as needed (Swelling). (Patient not taking: Reported on 12/27/2020) 90 capsule 3  . traMADol (ULTRAM) 50 MG tablet Take 50 mg by mouth every 6 (six) hours as needed for moderate pain. (Patient not taking: Reported on 12/27/2020)     No facility-administered medications prior to visit.    Review of Systems  Constitutional: Negative for chills, fever, malaise/fatigue and weight loss.  HENT: Negative for congestion,  sinus pain and sore throat.   Eyes: Negative.   Respiratory: Positive for cough, shortness of breath (with exertion) and wheezing (intermittent). Negative for hemoptysis and sputum production.   Cardiovascular: Negative for chest pain, palpitations, orthopnea, claudication, leg swelling and PND.  Gastrointestinal: Positive for heartburn. Negative for abdominal pain, nausea and vomiting.  Genitourinary: Negative.   Musculoskeletal: Negative.   Skin: Negative for rash.  Neurological: Negative for dizziness, weakness and headaches.  Endo/Heme/Allergies: Negative.   Psychiatric/Behavioral: Negative.    Objective:   Vitals:   01/03/21 1546  BP: 128/72  Pulse: 80  Temp: 98.2 F (36.8 C)  TempSrc: Temporal  SpO2: 98%  Weight: 166 lb (75.3 kg)  Height: 5\' 7"  (1.702 m)     Physical Exam Constitutional:      General: She is not in acute distress.    Appearance: Normal appearance. She is normal weight. She is not ill-appearing.  HENT:     Head: Normocephalic and atraumatic.  Eyes:     General: No scleral icterus.    Conjunctiva/sclera: Conjunctivae normal.  Cardiovascular:     Rate and Rhythm: Normal rate and regular rhythm.     Pulses: Normal pulses.     Heart sounds: Normal heart sounds. No murmur heard.   Pulmonary:     Effort: Pulmonary effort is normal. No respiratory distress.     Breath sounds: Rales (right base) present. No wheezing or rhonchi.  Abdominal:      General: Bowel sounds are normal.     Palpations: Abdomen is soft.  Musculoskeletal:     Right lower leg: No edema.     Left lower leg: No edema.  Skin:    General: Skin is warm and dry.  Neurological:     General: No focal deficit present.     Mental Status: She is alert.  Psychiatric:        Mood and Affect: Mood normal.        Behavior: Behavior normal.        Thought Content: Thought content normal.        Judgment: Judgment normal.    CBC    Component Value Date/Time   WBC 8.5 10/10/2020 0830   WBC 7.8 02/17/2019 1443   RBC 4.76 10/10/2020 0830   RBC 4.53 11/24/2019 0000   HGB 14.2 10/10/2020 0830   HCT 43.0 10/10/2020 0830   PLT 324 10/10/2020 0830   MCV 90 10/10/2020 0830   MCH 29.8 10/10/2020 0830   MCH 30.2 12/10/2017 0355   MCHC 33.0 10/10/2020 0830   MCHC 33.1 02/17/2019 1443   RDW 14.5 10/10/2020 0830   LYMPHSABS 1.7 02/17/2019 1443   MONOABS 0.9 02/17/2019 1443   EOSABS 0.3 02/17/2019 1443   BASOSABS 0.1 02/17/2019 1443   BMP Latest Ref Rng & Units 10/10/2020 02/17/2019 12/10/2017  Glucose 65 - 99 mg/dL 97 129(H) 128(H)  BUN 8 - 27 mg/dL 13 16 13   Creatinine 0.57 - 1.00 mg/dL 0.99 1.09 0.88  BUN/Creat Ratio 12 - 28 13 - -  Sodium 134 - 144 mmol/L 139 138 137  Potassium 3.5 - 5.2 mmol/L 4.7 4.2 3.8  Chloride 96 - 106 mmol/L 100 101 102  CO2 20 - 29 mmol/L 25 29 24   Calcium 8.7 - 10.3 mg/dL 9.4 8.7 8.2(L)   Chest imaging: CXR 01/03/2021 Heart and mediastinal contours are within normal limits. No focal opacities or effusions. No acute bony abnormality.  CXR 02/01/2013 Findings: Cardiomediastinal silhouette is stable. No acute  infiltrate or pleural effusion. No pulmonary edema. Bony thorax  is unremarkable. Again noted chronic mild interstitial prominence.   PFT: No flowsheet data found.  Echo: 10/30/2020 1. Left ventricular ejection fraction, by estimation, is 50 to 55%. The  left ventricle has low normal function. The left ventricle has no  regional  wall motion abnormalities. Left ventricular diastolic parameters are  consistent with Grade I diastolic  dysfunction (impaired relaxation). The average left ventricular global  longitudinal strain is -20.4 %.  2. Right ventricular systolic function is moderately reduced. The right  ventricular size is normal. There is normal pulmonary artery systolic  pressure. The estimated right ventricular systolic pressure is [id number] mmHg.  3. The mitral valve is normal in structure. Trivial mitral valve  regurgitation. No evidence of mitral stenosis.  4. The aortic valve is tricuspid. Aortic valve regurgitation is mild.  Mild aortic valve sclerosis is present, with no evidence of aortic valve  stenosis.  5. The inferior vena cava is normal in size with greater than 50%  respiratory variability, suggesting right atrial pressure of 3 mmHg.  Heart Catheterization:  Assessment & Plan:   Dyspnea on exertion - Plan: DG Chest 2 View, Pulmonary Function Test, albuterol (VENTOLIN HFA) 108 (90 Base) MCG/ACT inhaler  Discussion: Jessica Mcneil is a 80 year old woman, former smoker with nonischemic cardiomyopathy, hypertension, hyperlipidemia and GERD who is referred to pulmonary clinic for progressive dyspnea on exertion.  She does have concerning symptoms for reactive airways disease or obstructive lung disease given her history of dyspnea and intermittent wheezing. She does have significant smoking history although she quit 28 years ago. She also has reflux disease that can be aggravating her airways.   She also has reduced RV function on cardiac echo that could be contributing to her exertional dyspnea.   Her chest radiograph today is unremarkable. We will obtain pulmonary function tests and trial her on albuterol to monitor for any improvement in her symptoms. If her pulmonary function tests are unrevealing she may benefit from a cardiopulmonary exercise test to further evaluate her heart and  lung function together.    Follow up in 2 months  Freda Jackson, MD [facility] Office: [phone]   See Amion for Pager Details    Current Outpatient Medications:  .  acetaminophen (TYLENOL) 500 MG tablet, Take 500 mg by mouth. Patient reports taking 1 to 2 tablets a day if needed., Disp: , Rfl:  .  albuterol (VENTOLIN HFA) 108 (90 Base) MCG/ACT inhaler, Inhale 2 puffs into the lungs every 6 (six) hours as needed for wheezing or shortness of breath., Disp: 8 g, Rfl: 6 .  allopurinol (ZYLOPRIM) 300 MG tablet, TAKE 1 TABLET BY MOUTH EVERY DAY, Disp: 90 tablet, Rfl: 0 .  ALPRAZolam (XANAX) 0.5 MG tablet, TAKE 1 TABLET BY MOUTH AT  BEDTIME AS NEEDED FOR  SLEEP/ANXIETY, Disp: 90 tablet, Rfl: 0 .  aspirin EC 81 MG tablet, Take 81 mg by mouth daily., Disp: , Rfl:  .  CALCIUM-MAGNESIUM-ZINC PO, Take 1 tablet by mouth daily., Disp: , Rfl:  .  docusate sodium (COLACE) 100 MG capsule, Take 200 mg by mouth at bedtime., Disp: , Rfl:  .  FLUoxetine (PROZAC) 20 MG capsule, TAKE 2 CAPSULES BY MOUTH EVERY DAY, Disp: 180 capsule, Rfl: 0 .  HYDROcodone-acetaminophen (NORCO) 10-325 MG tablet, Take 1-2 tablets by mouth every 6 (six) hours as needed., Disp: 50 tablet, Rfl: 0 .  latanoprost (XALATAN) 0.005 % ophthalmic solution, Place  1 drop into the right eye at bedtime., Disp: , Rfl:  .  metoprolol succinate (TOPROL-XL) 50 MG 24 hr tablet, Take with or immediately following a meal., Disp: 90 tablet, Rfl: 3 .  Multiple Vitamin (MULTIVITAMIN WITH MINERALS) TABS, Take 1 tablet by mouth daily., Disp: , Rfl:  .  Omega-3 Fatty Acids 1200 MG CAPS, Take 1,200 mg by mouth daily., Disp: , Rfl:  .  omeprazole (PRILOSEC) 20 MG capsule, TAKE 1 CAPSULE BY MOUTH EVERY DAY, Disp: 90 capsule, Rfl: 0 .  pravastatin (PRAVACHOL) 10 MG tablet, Take 1 tablet (10 mg total) by mouth every evening., Disp: 90 tablet, Rfl: 3 .  Probiotic Product (PROBIOTIC ADVANCED PO), Take by mouth daily., Disp: , Rfl:  .   spironolactone (ALDACTONE) 25 MG tablet, Take 0.5 tablets (12.5 mg total) by mouth daily., Disp: 45 tablet, Rfl: 3

## 2021-01-03 NOTE — Patient Instructions (Signed)
Start albuterol inhaler 1-2 puffs every 4-6 hours for shortness of breath, wheezing or cough  We will schedule you for pulmonary function tests and call you or message you via MyChart with the results.

## 2021-01-09 ENCOUNTER — Other Ambulatory Visit: Payer: Self-pay | Admitting: Family Medicine

## 2021-01-22 ENCOUNTER — Other Ambulatory Visit (HOSPITAL_COMMUNITY)
Admission: RE | Admit: 2021-01-22 | Discharge: 2021-01-22 | Disposition: A | Discharge status: Home / Self Care | Payer: PPO | Source: Ambulatory Visit | Attending: Pulmonary Disease | Admitting: Pulmonary Disease

## 2021-01-22 DIAGNOSIS — Z20822 Contact with and (suspected) exposure to covid-19: Secondary | ICD-10-CM | POA: Diagnosis not present

## 2021-01-22 LAB — SARS CORONAVIRUS 2 (TAT 6-24 HRS): SARS Coronavirus 2: NEGATIVE

## 2021-01-25 ENCOUNTER — Ambulatory Visit (INDEPENDENT_AMBULATORY_CARE_PROVIDER_SITE_OTHER): Payer: PPO | Admitting: Pulmonary Disease

## 2021-01-25 ENCOUNTER — Other Ambulatory Visit: Payer: Self-pay

## 2021-01-25 DIAGNOSIS — R0600 Dyspnea, unspecified: Secondary | ICD-10-CM

## 2021-01-25 DIAGNOSIS — R0609 Other forms of dyspnea: Secondary | ICD-10-CM

## 2021-01-25 LAB — PULMONARY FUNCTION TEST
DL/VA % pred: 102 %
DL/VA: 4.17 ml/min/mmHg/L
DLCO cor % pred: 72 %
DLCO cor: 14.24 ml/min/mmHg
DLCO unc % pred: 72 %
DLCO unc: 14.24 ml/min/mmHg
FEF 25-75 Post: 2.37 L/sec
FEF 25-75 Pre: 2.87 L/sec
FEF2575-%Change-Post: -17 %
FEF2575-%Pred-Post: 151 %
FEF2575-%Pred-Pre: 182 %
FEV1-%Change-Post: -1 %
FEV1-%Pred-Post: 85 %
FEV1-%Pred-Pre: 87 %
FEV1-Post: 1.8 L
FEV1-Pre: 1.84 L
FEV1FVC-%Change-Post: -11 %
FEV1FVC-%Pred-Pre: 128 %
FEV6-%Change-Post: 10 %
FEV6-%Pred-Post: 80 %
FEV6-%Pred-Pre: 72 %
FEV6-Post: 2.14 L
FEV6-Pre: 1.93 L
FEV6FVC-%Pred-Post: 105 %
FEV6FVC-%Pred-Pre: 105 %
FVC-%Change-Post: 10 %
FVC-%Pred-Post: 75 %
FVC-%Pred-Pre: 68 %
FVC-Post: 2.14 L
FVC-Pre: 1.93 L
Post FEV1/FVC ratio: 84 %
Post FEV6/FVC ratio: 100 %
Pre FEV1/FVC ratio: 95 %
Pre FEV6/FVC Ratio: 100 %

## 2021-01-25 NOTE — Progress Notes (Signed)
PFT done today. 

## 2021-02-09 ENCOUNTER — Other Ambulatory Visit: Payer: Self-pay | Admitting: Family Medicine

## 2021-02-13 DIAGNOSIS — H401423 Capsular glaucoma with pseudoexfoliation of lens, left eye, severe stage: Secondary | ICD-10-CM | POA: Diagnosis not present

## 2021-03-02 ENCOUNTER — Ambulatory Visit (INDEPENDENT_AMBULATORY_CARE_PROVIDER_SITE_OTHER): Payer: PPO | Admitting: Pharmacist

## 2021-03-02 DIAGNOSIS — E785 Hyperlipidemia, unspecified: Secondary | ICD-10-CM | POA: Diagnosis not present

## 2021-03-02 DIAGNOSIS — I10 Essential (primary) hypertension: Secondary | ICD-10-CM

## 2021-03-02 DIAGNOSIS — M810 Age-related osteoporosis without current pathological fracture: Secondary | ICD-10-CM

## 2021-03-02 NOTE — Progress Notes (Unsigned)
Chronic Care Management Pharmacy Note  03/07/2021 Name:  Jessica Mcneil [facility] MRN:  [id number] DOB:  November 08, 1941  Subjective: Jessica Mcneil is an 80 y.o. year old female who is a primary patient of Burchette, Alinda Sierras, MD.  The CCM team was consulted for assistance with disease management and care coordination needs.    Engaged with patient by telephone for follow up visit in response to provider referral for pharmacy case management and/or care coordination services.   Consent to Services:  The patient was given information about Chronic Care Management services, agreed to services, and gave verbal consent prior to initiation of services.  Please see initial visit note for detailed documentation.   Patient Care Team: Eulas Post, MD as PCP - General (Family Medicine) Dorothy Spark, MD as PCP - Cardiology (Cardiology) Viona Gilmore, [facility] as Pharmacist (Pharmacist)  Recent office visits: 12/27/20 Charlott Rakes, LPN: Patient presented for AWV.  03/07/20 Carolann Littler, MD: Patient presented with ear pain. Recommended audiology assessment.   Recent consult visits: 01/25/21 Freda Jackson, MD (pulmonary): Patient presented with dyspnea on exertion. PFTs completed and showed moderate restrictive process.  01/03/21 Freda Jackson, MD (pulmonary): Patient presented for dyspnea on exertion consult. Prescribed albuterol PRN.  11/29/20 Ermalinda Barrios, PA-C (Cardiology): Patient presented for NICM follow up. Placed referral to pulmonology.  10/13/20 [name] (ophthalmology): Patient presented for eye exam. Unable to access notes.  10/10/20 Ermalinda Barrios, PA-C (Cardiology): Patient presented for NICM follow up. Increased pravachol to 20 mg daily.  09/12/20 Jyothi Mann (gastro): Patient presented for follow up for IBS. Unable to access notes.   Hospital visits: None in previous 6 months  Objective:  Lab Results  Component Value Date   CREATININE 0.99  10/10/2020   BUN 13 10/10/2020   GFR 48.78 (L) 02/17/2019   GFRNONAA 55 (L) 10/10/2020   GFRAA 64 10/10/2020   NA 139 10/10/2020   K 4.7 10/10/2020   CALCIUM 9.4 10/10/2020   CO2 25 10/10/2020    Lab Results  Component Value Date/Time   GFR 48.78 (L) 02/17/2019 02:43 PM   GFR 66.91 02/20/2016 08:40 AM    Last diabetic Eye exam: No results found for: HMDIABEYEEXA  Last diabetic Foot exam: No results found for: HMDIABFOOTEX   Lab Results  Component Value Date   CHOL 239 (H) 10/10/2020   HDL 62 10/10/2020   LDLCALC 152 (H) 10/10/2020   LDLDIRECT 216.7 06/05/2011   TRIG 140 10/10/2020   CHOLHDL 3.9 10/10/2020    Hepatic Function Latest Ref Rng & Units 10/10/2020 02/17/2019 05/02/2017  Total Protein 6.0 - 8.5 g/dL 6.9 6.5 6.5  Albumin 3.7 - 4.7 g/dL 4.3 4.1 4.0  AST 0 - 40 IU/L 17 19 15   ALT 0 - 32 IU/L 15 18 16   Alk Phosphatase 44 - 121 IU/L 92 76 70  Total Bilirubin 0.0 - 1.2 mg/dL 0.3 0.4 0.4  Bilirubin, Direct 0.0 - 0.3 mg/dL - - -    Lab Results  Component Value Date/Time   TSH 4.360 10/10/2020 08:30 AM   TSH 2.48 02/20/2016 08:40 AM    CBC Latest Ref Rng & Units 10/10/2020 11/24/2019 02/17/2019  WBC 3.4 - 10.8 x10E3/uL 8.5 7.3 7.8  Hemoglobin 11.1 - 15.9 g/dL 14.2 13.2 13.8  Hematocrit 34.0 - 46.6 % 43.0 41 41.7  Platelets 150 - 450 x10E3/uL 324 357 305.0    No results found for: VD25OH  Clinical ASCVD: No  The 10-year ASCVD risk score ([place]  Brooke Bonito., et al., 2013) is: 28.6%   Values used to calculate the score:     Age: 89 years     Sex: Female     Is Non-Hispanic African American: No     Diabetic: No     Tobacco smoker: No     Systolic Blood Pressure: 480 mmHg     Is BP treated: Yes     HDL Cholesterol: 62 mg/dL     Total Cholesterol: 239 mg/dL    Depression screen [facility] 2/9 12/27/2020 09/03/2019 06/16/2015  Decreased Interest 1 2 0  Down, Depressed, Hopeless 0 2 0  PHQ - 2 Score 1 4 0  Altered sleeping - 2 -  Tired, decreased energy - 3 -  Change in  appetite - 2 -  Feeling bad or failure about yourself  - 1 -  Trouble concentrating - 1 -  Moving slowly or fidgety/restless - 1 -  Suicidal thoughts - 0 -  PHQ-9 Score - 14 -  Difficult doing work/chores - Not difficult at all -     Social History   Tobacco Use  Smoking Status Former Smoker  . Packs/day: 2.00  . Types: Cigarettes  . Quit date: 12/23/1982  . Years since quitting: 38.2  Smokeless Tobacco Never Used   BP Readings from Last 3 Encounters:  01/03/21 128/72  11/29/20 100/60  10/10/20 112/70   Pulse Readings from Last 3 Encounters:  01/03/21 80  11/29/20 80  10/10/20 74   Wt Readings from Last 3 Encounters:  01/03/21 166 lb (75.3 kg)  11/29/20 166 lb 3.2 oz (75.4 kg)  10/10/20 164 lb (74.4 kg)    Assessment/Interventions: Review of patient past medical history, allergies, medications, health status, including review of consultants reports, laboratory and other test data, was performed as part of comprehensive evaluation and provision of chronic care management services.   SDOH:  (Social Determinants of Health) assessments and interventions performed: No   CCM Care Plan  Allergies  Allergen Reactions  . Lipitor [Atorvastatin]     UNSPECIFIED REACTION   . Codeine Itching  . Erythromycin Nausea And Vomiting    GI UPSET  . Sulfamethoxazole Itching    Over 40 years ago    Medications Reviewed Today    Reviewed by [name], MD (Physician) on 01/03/21 at 1609  Med List Status: <None>  Medication Order Taking? Sig Documenting Provider Last Dose Status Informant  acetaminophen (TYLENOL) 500 MG tablet [id number] Yes Take 500 mg by mouth. Patient reports taking 1 to 2 tablets a day if needed. [provider] Taking Active Self  albuterol (VENTOLIN HFA) 108 (90 Base) MCG/ACT inhaler [id number] Yes Inhale 2 puffs into the lungs every 6 (six) hours as needed for wheezing or shortness of breath. [name], MD  Active   allopurinol  (ZYLOPRIM) 300 MG tablet [id number] Yes TAKE 1 TABLET BY MOUTH EVERY DAY Burchette, Alinda Sierras, MD Taking Active   ALPRAZolam Duanne Moron) 0.5 MG tablet [id number] Yes TAKE 1 TABLET BY MOUTH AT  BEDTIME AS NEEDED FOR  SLEEP/ANXIETY Burchette, Alinda Sierras, MD Taking Active   aspirin EC 81 MG tablet [id number] Yes Take 81 mg by mouth daily. [provider] Taking Active Self  CALCIUM-MAGNESIUM-ZINC PO [id number] Yes Take 1 tablet by mouth daily. [provider] Taking Active Self  docusate sodium (COLACE) 100 MG capsule [id number] Yes Take 200 mg by mouth at bedtime. [provider] Taking Active Self  FLUoxetine (PROZAC) 20 MG capsule [id number] Yes  TAKE 2 CAPSULES BY MOUTH EVERY DAY Burchette, Alinda Sierras, MD Taking Active   HYDROcodone-acetaminophen [facility]) 10-325 MG tablet [id number] Yes Take 1-2 tablets by mouth every 6 (six) hours as needed. Marchia Bond, MD Taking Active   latanoprost (XALATAN) 0.005 % ophthalmic solution [id number] Yes Place 1 drop into the right eye at bedtime. [provider] Taking Active   metoprolol succinate (TOPROL-XL) 50 MG 24 hr tablet [id number] Yes Take with or immediately following a meal. Imogene Burn, PA-C Taking Active   Multiple Vitamin (MULTIVITAMIN WITH MINERALS) TABS [id number] Yes Take 1 tablet by mouth daily. [provider] Taking Active Self  Omega-3 Fatty Acids 1200 MG CAPS [id number] Yes Take 1,200 mg by mouth daily. [provider] Taking Active Self  omeprazole (PRILOSEC) 20 MG capsule [id number] Yes TAKE 1 CAPSULE BY MOUTH EVERY DAY Burchette, Alinda Sierras, MD Taking Active   pravastatin (PRAVACHOL) 10 MG tablet [id number] Yes Take 1 tablet (10 mg total) by mouth every evening. Imogene Burn, PA-C Taking Active   Probiotic Product (PROBIOTIC ADVANCED PO) [id number] Yes Take by mouth daily. [provider] Taking Active Self  spironolactone (ALDACTONE) 25 MG tablet [id number] Yes Take 0.5 tablets (12.5 mg total) by mouth  daily. Imogene Burn, PA-C Taking Active           Patient Active Problem List   Diagnosis Date Noted  . 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 Primary localized osteoarthrosis of shoulder 12/09/2017  . Chronic right shoulder pain 05/29/2017  . Osteoarthritis of hands, bilateral 05/29/2017  . Primary osteoarthritis of both feet 05/29/2017  . Gout 02/13/2016  . Migraine headache with aura 05/08/2015  . Chronic systolic heart failure ([place]) 07/18/2014  . HTN (hypertension) 08/03/2013  . Abnormal mammogram with microcalcification 01/25/2013  . Dyspnea 03/14/2011  . HEADACHE 06/21/2010  . ACUTE CHRONIC COMB SYSTOLIC&DIASTOLIC HEART FAIL 89/37/3428  . History of depression 08/04/2009  . Insomnia 08/04/2009  . CONSTIPATION 06/22/2009  . Hyperlipidemia 02/23/2009  . 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 Nonischemic cardiomyopathy ([place]) 02/23/2009    Immunization History  Administered Date(s) Administered  . Fluad Quad(high Dose 65+) 09/03/2019  . Influenza, High Dose Seasonal PF 10/11/2014, 10/07/2017, 02/17/2019  . Influenza-Unspecified 09/04/2016, 10/07/2017, 09/27/2020  . PFIZER(Purple Top)SARS-COV-2 Vaccination 01/30/2020, 02/24/2020, 09/21/2020  . Pneumococcal Conjugate-13 02/13/2016, 10/07/2017  . Pneumococcal Polysaccharide-23 12/23/2006  . Td 12/23/2004  . Tdap 02/13/2016    Conditions to be addressed/monitored:  Hypertension, Hyperlipidemia, Heart Failure, GERD, Gout and migraines, constipation  Care Plan : [place]  Updates made by Viona Gilmore, [place] since 03/07/2021 12:00 AM    Problem: Problem: Hypertension, Hyperlipidemia, Heart Failure, Anxiety, GERD, Gout and migraines, constipation     Long-Range Goal: Patient-Specific Goal   Start Date: 03/02/2021  Expected End Date: 03/02/2022  This Visit's Progress: On track  Priority: High  Note:   Current Barriers:  . Unable to independently monitor therapeutic efficacy  Pharmacist  Clinical Goal(s):  [name] Patient will achieve adherence to monitoring guidelines and medication adherence to achieve therapeutic efficacy through collaboration with PharmD and provider.   Interventions: . 1:1 collaboration with Eulas Post, MD regarding development and update of comprehensive plan of care as evidenced by provider attestation and co-signature . Inter-disciplinary care team collaboration (see longitudinal plan of care) . Comprehensive medication review performed; medication list updated in electronic medical record  Hypertension (BP goal <140/90) -Controlled -Current treatment:  metoprolol succinate 26m, 1 tablet once daily with or immediately  following a meal   spironolactone 8m, 0.5 tablet daily -Medications previously tried:  lisinopril, losartan   -Current home readings: 110-120s/70-80s (never > 120) -Current dietary habits: did not discuss -Current exercise habits: did not discuss -Reports hypotensive/hypertensive symptoms -Educated on Importance of home blood pressure monitoring; -Counseled to monitor BP at home at least weekly, document, and provide log at future appointments -Counseled on diet and exercise extensively Recommended to continue current medication  Hyperlipidemia: (LDL goal < 70) -Uncontrolled -Current treatment:  pravastatin 246m1 tablet daily  omega-3 fatty acids 120033m1 capsule daily -Medications previously tried: atorvastatin  -Current dietary patterns: doesn't cook with oil; eats oatmeal or cheerios for breakfast and eats one meal a day with vegetables -Current exercise habits: goes on short walks with her dog every day -Educated on Cholesterol goals;  Importance of limiting foods high in cholesterol; Exercise goal of 150 minutes per week; -Recommended to continue current medication  Heart Failure (Goal: manage symptoms and prevent exacerbations) -Controlled -Last ejection fraction: 45-50% (Date: n/a) -HF type: Combined  Systolic and Diastolic -NYHA Class: II (slight limitation of activity) -AHA HF Stage: C (Heart disease and symptoms present) -Current treatment:  metoprolol succinate 10m47m tablet once daily  spironolactone 25mg10m5 tablet daily  HCTZ 12.5mg, 31mapsule once daily as needed for swelling (patient stated she has not taken in a long time; unable to provide timeframe) -Medications previously tried:  lisinopril, losartan  -Current home BP/HR readings: BP usually 110-120s -Current dietary habits: did not discuss -Current exercise habits: goes on short walks with her dog daily -Educated on Importance of weighing daily; if you gain more than 3 pounds in one day or 5 pounds in one week, call provider Proper diuretic administration and potassium supplementation -Recommended to continue current medication  Anxiety (Goal: minimize symptoms) -Uncontrolled -Current treatment:  fluoxetine 20mg, 66mpsules daily  - has cut down to 1 capsule  alprazolam 0.5mg, 1 52mlet at bedtime as needed for sleep/anxiety  -Medications previously tried/failed: none -PHQ9: 1 -GAD7: n/a -Educated on Benefits of medication for symptom control Benefits of cognitive-behavioral therapy with or without medication - Patient inquired about switching to sertraline as others have had success and she feels anxiety has worsened  Osteoporosis (Goal improve bone density and prevent fractures) -Uncontrolled -Last DEXA Scan: 09/08/19   T-Score femoral neck: -1.9, -1.8  T-Score total hip: n/a  T-Score lumbar spine: 0.1  T-Score forearm radius: n/a  10-year probability of major osteoporotic fracture: 20.6%  10-year probability of hip fracture: 5.1% -Patient is a candidate for pharmacologic treatment due to T-Score -1.0 to -2.5 and 10-year risk of major osteoporotic fracture > 20% and T-Score -1.0 to -2.5 and 10-year risk of hip fracture > 3% -Current treatment  . Calcium-mag-zinc with D3 (D3 5 mcg, 333 mg of calcium) 1 at  night  -Medications previously tried: alendronate (GI side effects) -Recommend [phone] units of vitamin D daily. Recommend 1200 mg of calcium daily from dietary and supplemental sources. Recommend weight-bearing and muscle strengthening exercises for building and maintaining bone density. -Recommended switching to calcium citrate with chronic PPI therapy  Gout (Goal: uric acid < 6 and prevent flare ups) -Controlled -Current treatment   allopurinol 300mg, 1 40met daily  colchicine 0.6mg, 1 ta22mt daily as needed -Medications previously tried: none  -Counseled on eating a low-purine diet. Avoid foods and drinks such as: liver, kidney, anchovies, asparagus, herring, mushrooms, mussels, beer,etc.  Migraines (Goal: limit severity and frequency of migraines) -Controlled -Current treatment  hydrocodone/ APAP 10/335m, 1 to 2 tablets every six hours as needed (for bad headaches- not taken for >6 months )  Tramadol 50 mg every 6 hours for pain (came from orthopedic surgeon)   Tylenol 5059m takes 1 to 2 tablets a day if needed. Patient notes does not very often -Medications previously tried: n/a  -Recommended to continue current medication  GERD (Goal: minimize symptoms of acid reflux/heartburn) -Controlled -Current treatment  . Omeprazole 2044m1 capsule daily  -Medications previously tried: none  -Recommended to continue current medication   Health Maintenance -Vaccine gaps: pneumovax -Current therapy:   Probiotic 1 capsule once daily  Aspirin 81 mg 1 tablet daily  Multivitamin 1 tablet daily (100 mg calcium, vitamin D 1000 units) -Educated on Cost vs benefit of each product must be carefully weighed by individual consumer -Patient is satisfied with current therapy and denies issues -Recommended to continue current medication  Patient Goals/Self-Care Activities . Patient will:  - take medications as prescribed target a minimum of 150 minutes of moderate intensity  exercise weekly  Follow Up Plan: Telephone follow up appointment with care management team member scheduled for: 6 months       Medication Assistance: None required.  Patient affirms current coverage meets needs.  [facility]  [id number] [place] [id number]: [phone]: [phone] pill box? Yes Pt endorses 100% compliance  We discussed: Current pharmacy is preferred with insurance plan and patient is satisfied with pharmacy services Patient decided to: Continue current medication management strategy  Care Plan and Follow Up Patient Decision:  Patient agrees to Care Plan and Follow-up.  Plan: Telephone follow up appointment with care management team member scheduled for:  4 months   MadeJeni SallesarmD BCACLavinarmacist [place]-[phone]

## 2021-03-07 NOTE — Patient Instructions (Addendum)
Hi Nataley,  It was great to get to meet you over the telephone! Below is a summary of some of the topics we discussed. I will reach back out to you once I hear back from Dr. Elease Hashimoto.  Please reach out to me if you have any questions or need anything before our follow up!  Best, Maddie  Jeni Salles, PharmD, [facility]  Visit Information  Goals Addressed   None    Patient Care Plan: [facility] Pharmacy Care Plan    Problem Identified: Problem: Hypertension, Hyperlipidemia, Heart Failure, Anxiety, GERD, Gout and migraines, constipation     Long-Range Goal: Patient-Specific Goal   Start Date: 03/02/2021  Expected End Date: 03/02/2022  This Visit's Progress: On track  Priority: High  Note:   Current Barriers:  . Unable to independently monitor therapeutic efficacy  Pharmacist Clinical Goal(s):  [name] Patient will achieve adherence to monitoring guidelines and medication adherence to achieve therapeutic efficacy through collaboration with PharmD and provider.   Interventions: . 1:1 collaboration with Eulas Post, MD regarding development and update of comprehensive plan of care as evidenced by provider attestation and co-signature . Inter-disciplinary care team collaboration (see longitudinal plan of care) . Comprehensive medication review performed; medication list updated in electronic medical record  Hypertension (BP goal <140/90) -Controlled -Current treatment:  metoprolol succinate 50mg , 1 tablet once daily with or immediately following a meal   spironolactone 25mg , 0.5 tablet daily -Medications previously tried:  lisinopril, losartan   -Current home readings: 110-120s/70-80s (never > 120) -Current dietary habits: did not discuss -Current exercise habits: did not discuss -Reports hypotensive/hypertensive symptoms -Educated on Importance of home blood pressure monitoring; -Counseled to monitor BP at home  at least weekly, document, and provide log at future appointments -Counseled on diet and exercise extensively Recommended to continue current medication  Hyperlipidemia: (LDL goal < 70) -Uncontrolled -Current treatment:  pravastatin 20mg  1 tablet daily  omega-3 fatty acids 1200mg , 1 capsule daily -Medications previously tried: atorvastatin  -Current dietary patterns: doesn't cook with oil; eats oatmeal or cheerios for breakfast and eats one meal a day with vegetables -Current exercise habits: goes on short walks with her dog every day -Educated on Cholesterol goals;  Importance of limiting foods high in cholesterol; Exercise goal of 150 minutes per week; -Recommended to continue current medication  Heart Failure (Goal: manage symptoms and prevent exacerbations) -Controlled -Last ejection fraction: 45-50% (Date: n/a) -HF type: Combined Systolic and Diastolic -NYHA Class: II (slight limitation of activity) -AHA HF Stage: C (Heart disease and symptoms present) -Current treatment:  metoprolol succinate 50mg , 1 tablet once daily  spironolactone 25mg , 0.5 tablet daily  HCTZ 12.5mg , 1 capsule once daily as needed for swelling (patient stated she has not taken in a long time; unable to provide timeframe) -Medications previously tried:  lisinopril, losartan  -Current home BP/HR readings: BP usually 110-120s -Current dietary habits: did not discuss -Current exercise habits: goes on short walks with her dog daily -Educated on Importance of weighing daily; if you gain more than 3 pounds in one day or 5 pounds in one week, call provider Proper diuretic administration and potassium supplementation -Recommended to continue current medication  Anxiety (Goal: minimize symptoms) -Uncontrolled -Current treatment:  fluoxetine 20mg , 2 capsules daily  - has cut down to 1 capsule  alprazolam 0.5mg , 1 tablet at bedtime as needed for sleep/anxiety  -Medications previously tried/failed:  none -PHQ9: 1 -GAD7: n/a -Educated on Benefits of medication for symptom control Benefits  of cognitive-behavioral therapy with or without medication - Patient inquired about switching to sertraline as others have had success and she feels anxiety has worsened  Osteoporosis (Goal improve bone density and prevent fractures) -Uncontrolled -Last DEXA Scan: 09/08/19   T-Score femoral neck: -1.9, -1.8  T-Score total hip: n/a  T-Score lumbar spine: 0.1  T-Score forearm radius: n/a  10-year probability of major osteoporotic fracture: 20.6%  10-year probability of hip fracture: 5.1% -Patient is a candidate for pharmacologic treatment due to T-Score -1.0 to -2.5 and 10-year risk of major osteoporotic fracture > 20% and T-Score -1.0 to -2.5 and 10-year risk of hip fracture > 3% -Current treatment  . Calcium-mag-zinc with D3 (D3 5 mcg, 333 mg of calcium) 1 at night  -Medications previously tried: alendronate (GI side effects) -Recommend [phone] units of vitamin D daily. Recommend 1200 mg of calcium daily from dietary and supplemental sources. Recommend weight-bearing and muscle strengthening exercises for building and maintaining bone density. -Recommended switching to calcium citrate with chronic PPI therapy  Gout (Goal: uric acid < 6 and prevent flare ups) -Controlled -Current treatment   allopurinol 300mg , 1 tablet daily  colchicine 0.6mg , 1 tablet daily as needed -Medications previously tried: none  -Counseled on eating a low-purine diet. Avoid foods and drinks such as: liver, kidney, anchovies, asparagus, herring, mushrooms, mussels, beer,etc.  Migraines (Goal: limit severity and frequency of migraines) -Controlled -Current treatment   hydrocodone/ APAP 10/325mg , 1 to 2 tablets every six hours as needed (for bad headaches- not taken for >6 months )  Tramadol 50 mg every 6 hours for pain (came from orthopedic surgeon)   Tylenol 500mg , takes 1 to 2 tablets a day if needed. Patient  notes does not very often -Medications previously tried: n/a  -Recommended to continue current medication  GERD (Goal: minimize symptoms of acid reflux/heartburn) -Controlled -Current treatment  . Omeprazole 20mg , 1 capsule daily  -Medications previously tried: none  -Recommended to continue current medication   Health Maintenance -Vaccine gaps: pneumovax -Current therapy:   Probiotic 1 capsule once daily  Aspirin 81 mg 1 tablet daily  Multivitamin 1 tablet daily (100 mg calcium, vitamin D 1000 units) -Educated on Cost vs benefit of each product must be carefully weighed by individual consumer -Patient is satisfied with current therapy and denies issues -Recommended to continue current medication  Patient Goals/Self-Care Activities . Patient will:  - take medications as prescribed target a minimum of 150 minutes of moderate intensity exercise weekly  Follow Up Plan: Telephone follow up appointment with care management team member scheduled for: 6 months       Patient verbalizes understanding of instructions provided today and agrees to view in [place].  Telephone follow up appointment with pharmacy team member scheduled for:4 months  Viona Gilmore, [facility]  High Cholesterol  High cholesterol is a condition in which the blood has high levels of a white, waxy substance similar to fat (cholesterol). The liver makes all the cholesterol that the body needs. The human body needs small amounts of cholesterol to help build cells. A person gets extra or excess cholesterol from the food that he or she eats. The blood carries cholesterol from the liver to the rest of the body. If you have high cholesterol, deposits (plaques) may build up on the walls of your arteries. Arteries are the blood vessels that carry blood away from your heart. These plaques make the arteries narrow and stiff. Cholesterol plaques increase your risk for heart attack and stroke. Work with  your health care  provider to keep your cholesterol levels in a healthy range. What increases the risk? The following factors may make you more likely to develop this condition:  Eating foods that are high in animal fat (saturated fat) or cholesterol.  Being overweight.  Not getting enough exercise.  A family history of high cholesterol (familial hypercholesterolemia).  Use of tobacco products.  Having diabetes. What are the signs or symptoms? There are no symptoms of this condition. How is this diagnosed? This condition may be diagnosed based on the results of a blood test.  If you are older than 80 years of age, your health care provider may check your cholesterol levels every 4-6 years.  You may be checked more often if you have high cholesterol or other risk factors for heart disease. The blood test for cholesterol measures:  "Bad" cholesterol, or LDL cholesterol. This is the main type of cholesterol that causes heart disease. The desired level is less than 100 mg/dL.  "Good" cholesterol, or HDL cholesterol. HDL helps protect against heart disease by cleaning the arteries and carrying the LDL to the liver for processing. The desired level for HDL is 60 mg/dL or higher.  Triglycerides. These are fats that your body can store or burn for energy. The desired level is less than 150 mg/dL.  Total cholesterol. This measures the total amount of cholesterol in your blood and includes LDL, HDL, and triglycerides. The desired level is less than 200 mg/dL. How is this treated? This condition may be treated with:  Diet changes. You may be asked to eat foods that have more fiber and less saturated fats or added sugar.  Lifestyle changes. These may include regular exercise, maintaining a healthy weight, and quitting use of tobacco products.  Medicines. These are given when diet and lifestyle changes have not worked. You may be prescribed a statin medicine to help lower your cholesterol levels. Follow  these instructions at home: Eating and drinking  Eat a healthy, balanced diet. This diet includes: ? Daily servings of a variety of fresh, frozen, or canned fruits and vegetables. ? Daily servings of whole grain foods that are rich in fiber. ? Foods that are low in saturated fats and trans fats. These include poultry and fish without skin, lean cuts of meat, and low-fat dairy products. ? A variety of fish, especially oily fish that contain omega-3 fatty acids. Aim to eat fish at least 2 times a week.  Avoid foods and drinks that have added sugar.  Use healthy cooking methods, such as roasting, grilling, broiling, baking, poaching, steaming, and stir-frying. Do not fry your food except for stir-frying.   Lifestyle  Get regular exercise. Aim to exercise for a total of 150 minutes a week. Increase your activity level by doing activities such as gardening, walking, and taking the stairs.  Do not use any products that contain nicotine or tobacco, such as cigarettes, e-cigarettes, and chewing tobacco. If you need help quitting, ask your health care provider.   General instructions  Take over-the-counter and prescription medicines only as told by your health care provider.  Keep all follow-up visits as told by your health care provider. This is important. Where to find more information  American Heart Association: www.heart.org  National Heart, Lung, and Blood Institute: https://wilson-eaton.com/ Contact a health care provider if:  You have trouble achieving or maintaining a healthy diet or weight.  You are starting an exercise program.  You are unable to stop smoking. Get help  right away if:  You have chest pain.  You have trouble breathing.  You have any symptoms of a stroke. "BE FAST" is an easy way to remember the main warning signs of a stroke: ? B - Balance. Signs are dizziness, sudden trouble walking, or loss of balance. ? E - Eyes. Signs are trouble seeing or a sudden change in  vision. ? F - Face. Signs are sudden weakness or numbness of the face, or the face or eyelid drooping on one side. ? A - Arms. Signs are weakness or numbness in an arm. This happens suddenly and usually on one side of the body. ? S - Speech. Signs are sudden trouble speaking, slurred speech, or trouble understanding what people say. ? T - Time. Time to call emergency services. Write down what time symptoms started.  You have other signs of a stroke, such as: ? A sudden, severe headache with no known cause. ? Nausea or vomiting. ? Seizure. These symptoms may represent a serious problem that is an emergency. Do not wait to see if the symptoms will go away. Get medical help right away. Call your local emergency services (911 in the [place] Do not drive yourself to the hospital. Summary  Cholesterol plaques increase your risk for heart attack and stroke. Work with your health care provider to keep your cholesterol levels in a healthy range.  Eat a healthy, balanced diet, get regular exercise, and maintain a healthy weight.  Do not use any products that contain nicotine or tobacco, such as cigarettes, e-cigarettes, and chewing tobacco.  Get help right away if you have any symptoms of a stroke. This information is not intended to replace advice given to you by your health care provider. Make sure you discuss any questions you have with your health care provider. Document Revised: 11/08/2019 Document Reviewed: 11/08/2019 Elsevier Patient Education  2021 [place] and Cholesterol Restricted Eating Plan Eating a diet that limits fat and cholesterol may help lower your risk for heart disease and other conditions. Your body needs fat and cholesterol for basic functions, but eating too much of these things can be harmful to your health. Your health care provider may order lab tests to check your blood fat (lipid) and cholesterol levels. This helps your health care provider understand your risk  for certain conditions and whether you need to make diet changes. Work with your health care provider or dietitian to make an eating plan that is right for you. Your plan includes:  Limit your fat intake to ______% or less of your total calories a day.  Limit your saturated fat intake to ______% or less of your total calories a day.  Limit the amount of cholesterol in your diet to less than _________mg a day.  Eat ___________ g of fiber a day. What are tips for following this plan? General guidelines  If you are overweight, work with your health care provider to lose weight safely. Losing just 5-10% of your body weight can improve your overall health and help prevent diseases such as diabetes and heart disease.  Avoid: ? Foods with added sugar. ? Fried foods. ? Foods that contain partially hydrogenated oils, including stick margarine, some tub margarines, cookies, crackers, and other baked goods.  Limit alcohol intake to no more than 1 drink a day for nonpregnant women and 2 drinks a day for men. One drink equals 12 oz of beer, 5 oz of wine, or 1 oz of hard liquor.  Reading food labels  Check food labels for: ? Trans fats, partially hydrogenated oils, or high amounts of saturated fat. Avoid foods that contain saturated fat and trans fat. ? The amount of cholesterol in each serving. Try to eat no more than 200 mg of cholesterol each day. ? The amount of fiber in each serving. Try to eat at least 20-30 g of fiber each day.  Choose foods with healthy fats, such as: ? Monounsaturated and polyunsaturated fats. These include olive and canola oil, flaxseeds, walnuts, almonds, and seeds. ? Omega-3 fats. These are found in foods such as salmon, mackerel, sardines, tuna, flaxseed oil, and ground flaxseeds.  Choose grain products that have whole grains. Look for the word "whole" as the first word in the ingredient list. Cooking  Cook foods using methods other than frying. Baking, boiling,  grilling, and broiling are some healthy options.  Eat more home-cooked food and less restaurant, buffet, and fast food.  Avoid cooking using saturated fats. ? Animal sources of saturated fats include meats, butter, and cream. ? Plant sources of saturated fats include palm oil, palm kernel oil, and coconut oil. Meal planning  At meals, imagine dividing your plate into fourths: ? Fill one-half of your plate with vegetables and green salads. ? Fill one-fourth of your plate with whole grains. ? Fill one-fourth of your plate with lean protein foods.  Eat fish that is high in omega-3 fats at least two times a week.  Eat more foods that contain fiber, such as whole grains, beans, apples, broccoli, carrots, peas, and barley. These foods help promote healthy cholesterol levels in the blood.   Recommended foods Grains  Whole grains, such as whole wheat or whole grain breads, crackers, cereals, and pasta. Unsweetened oatmeal, bulgur, barley, quinoa, or brown rice. Corn or whole wheat flour tortillas. Vegetables  Fresh or frozen vegetables (raw, steamed, roasted, or grilled). Green salads. Fruits  All fresh, canned (in natural juice), or frozen fruits. Meats and other protein foods  Ground beef (85% or leaner), grass-fed beef, or beef trimmed of fat. Skinless chicken or [place]. Ground chicken or [place]. Pork trimmed of fat. All fish and seafood. Egg whites. Dried beans, peas, or lentils. Unsalted nuts or seeds. Unsalted canned beans. Natural nut butters without added sugar and oil. Dairy  Low-fat or nonfat dairy products, such as skim or 1% milk, 2% or reduced-fat cheeses, low-fat and fat-free ricotta or cottage cheese, or plain low-fat and nonfat yogurt. Fats and oils  Tub margarine without trans fats. Light or reduced-fat mayonnaise and salad dressings. Avocado. Olive, canola, sesame, or safflower oils. The items listed above may not be a complete list of foods and beverages you can eat.  Contact a dietitian for more information. Foods to avoid Grains  White bread. White pasta. White rice. Cornbread. Bagels, pastries, and croissants. Crackers and snack foods that contain trans fat and hydrogenated oils. Vegetables  Vegetables cooked in cheese, cream, or butter sauce. Fried vegetables. Fruits  Canned fruit in heavy syrup. Fruit in cream or butter sauce. Fried fruit. Meats and other protein foods  Fatty cuts of meat. Ribs, chicken wings, bacon, sausage, bologna, salami, chitterlings, fatback, hot dogs, bratwurst, and packaged lunch meats. Liver and organ meats. Whole eggs and egg yolks. Chicken and [place] with skin. Fried meat. Dairy  Whole or 2% milk, cream, half-and-half, and cream cheese. Whole milk cheeses. Whole-fat or sweetened yogurt. Full-fat cheeses. Nondairy creamers and whipped toppings. Processed cheese, cheese spreads, and cheese curds. Beverages  Alcohol. Sugar-sweetened drinks such as sodas, lemonade, and fruit drinks. Fats and oils  Butter, stick margarine, lard, shortening, ghee, or bacon fat. Coconut, palm kernel, and palm oils. Sweets and desserts  Corn syrup, sugars, honey, and molasses. Candy. Jam and jelly. Syrup. Sweetened cereals. Cookies, pies, cakes, donuts, muffins, and ice cream. The items listed above may not be a complete list of foods and beverages you should avoid. Contact a dietitian for more information. Summary  Your body needs fat and cholesterol for basic functions. However, eating too much of these things can be harmful to your health.  Work with your health care provider and dietitian to follow a diet low in fat and cholesterol. Doing this may help lower your risk for heart disease and other conditions.  Choose healthy fats, such as monounsaturated and polyunsaturated fats, and foods high in omega-3 fatty acids.  Eat fiber-rich foods, such as whole grains, beans, peas, fruits, and vegetables.  Limit or avoid alcohol, fried  foods, and foods high in saturated fats, partially hydrogenated oils, and sugar. This information is not intended to replace advice given to you by your health care provider. Make sure you discuss any questions you have with your health care provider. Document Revised: 08/09/2020 Document Reviewed: 04/12/2020 Elsevier Patient Education  2021 [employer].

## 2021-03-08 ENCOUNTER — Encounter: Payer: Self-pay | Admitting: Pulmonary Disease

## 2021-03-08 ENCOUNTER — Other Ambulatory Visit: Payer: Self-pay

## 2021-03-08 ENCOUNTER — Ambulatory Visit: Payer: PPO | Admitting: Pulmonary Disease

## 2021-03-08 ENCOUNTER — Other Ambulatory Visit: Payer: Self-pay | Admitting: Family Medicine

## 2021-03-08 VITALS — BP 114/72 | HR 75 | Temp 97.5°F | Ht 67.0 in | Wt 169.6 lb

## 2021-03-08 DIAGNOSIS — R0609 Other forms of dyspnea: Secondary | ICD-10-CM

## 2021-03-08 DIAGNOSIS — R942 Abnormal results of pulmonary function studies: Secondary | ICD-10-CM

## 2021-03-08 DIAGNOSIS — R0600 Dyspnea, unspecified: Secondary | ICD-10-CM | POA: Diagnosis not present

## 2021-03-08 MED ORDER — SERTRALINE HCL 50 MG PO TABS: ORAL_TABLET | ORAL | 5 refills | Status: DC

## 2021-03-08 MED ORDER — SPIRIVA RESPIMAT 2.5 MCG/ACT IN AERS: 2.0000 | INHALATION_SPRAY | Freq: Every day | RESPIRATORY_TRACT | 6 refills | Status: DC

## 2021-03-08 MED ORDER — SPIRIVA RESPIMAT 2.5 MCG/ACT IN AERS: 2.0000 | INHALATION_SPRAY | Freq: Every day | RESPIRATORY_TRACT | 0 refills | Status: DC

## 2021-03-08 NOTE — Patient Instructions (Addendum)
We will schedule you for a high resolution CT chest scan  We will schedule you for a home sleep study  Start Spiriva respimat 2.3mcg 2 puffs daily and continue as needed albuterol

## 2021-03-08 NOTE — Progress Notes (Signed)
Pt requested change from Prozac to Zoloft.  Will stop the Prozac and start Zoloft 25 mg daily for 2 weeks and then increase to 50 mg once daily.

## 2021-03-08 NOTE — Progress Notes (Signed)
Synopsis: Referred in 12/2020 for shortness of breath by Jessica Barrios, PA  Subjective:   PATIENT ID: Jessica Mcneil GENDER: female DOB: October 31, 1941, MRN: [id number]   HPI  Chief Complaint  Patient presents with  . Follow-up    2 mo f/u after PFT. States her breathing has been stable since last visit. States she has been using the albuterol twice daily. Has noticed an increase in chest tightness. Did have a runny nose but feels this was from allergies.    Jessica Mcneil is a 80 year old woman, former smoker with nonischemic cardiomyopathy, hypertension, hyperlipidemia and GERD who returns to pulmonary clinic for progressive dyspnea on exertion.   She continues to have exertional dyspnea since last visit. She reports her wheezing has improved and she does benefit from the albuterol and is using it a couple times per day.   She had PFTs done on 01/25/21 and she has moderate restrictive defect and mild diffusion defect.   OV 01/03/21: She reports having shortness of breath that has been progressing over the past several years. She has been following with cardiology for nonischemic cardiomyopathy with reduced EF that has improved upon echo surveillance to 50-55%. She does experience some wheezing at times when walking her dog. She also reports cough in the morning time. Her dyspnea is worse in cold weather but hot humid days are even harder. She does have sinus congestion and watery eyes due to seasonal allergies. She does report history of snoring and some days she does feel rested from sleep and others not so much. She does have afternoon fatigue and sleepiness. She does have history of GERD and is taking omeprazole with intermittent symptoms.    She smoked from age 74 to 46, where she smoked about a pack per day giving her a 34 pack year history. She was a lab tech previously. Denies harmful dust or chemical exposures.  Past Medical History:  Diagnosis Date  . Anxiety   . Arthritis    . CHF (congestive heart failure) ([place])    due to non ischemic cardiomyopathy  . Depression   . GERD (gastroesophageal reflux disease)   . Headache(784.0)    migraines  . HLD (hyperlipidemia)   . HTN (hypertension)   . Overweight(278.02)   . Pneumonia   . PONV (postoperative nausea and vomiting)    long time ago none recent  . 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 Shortness of breath      Family History  Problem Relation Age of Onset  . Heart disease Father   . Arthritis Other   . Hyperlipidemia Other   . Breast cancer Neg Hx      Social History   Socioeconomic History  . Marital status: Divorced    Spouse name: Not on file  . Number of children: Not on file  . Years of education: Not on file  . Highest education level: Not on file  Occupational History  . Occupation: retired  Tobacco Use  . Smoking status: Former Smoker    Packs/day: 2.00    Types: Cigarettes    Quit date: 12/23/1982    Years since quitting: 38.2  . Smokeless tobacco: Never Used  Vaping Use  . Vaping Use: Never used  Substance and Sexual Activity  . Alcohol use: No  . Drug use: No  . Sexual activity: Not on file  Other Topics Concern  . Not on file  Social History Narrative  . Not on file  Social Determinants of Health   Financial Resource Strain: Low Risk   . Difficulty of Paying Living Expenses: Not hard at all  Food Insecurity: No Food Insecurity  . Worried About Charity fundraiser in the Last Year: Never true  . Ran Out of Food in the Last Year: Never true  Transportation Needs: No Transportation Needs  . Lack of Transportation (Medical): No  . Lack of Transportation (Non-Medical): No  Physical Activity: Insufficiently Active  . Days of Exercise per Week: 7 days  . Minutes of Exercise per Session: 20 min  Stress: No Stress Concern Present  . Feeling of Stress : Only a little  Social Connections: Socially Isolated  . Frequency of Communication with Friends and  Family: Twice a week  . Frequency of Social Gatherings with Friends and Family: Three times a week  . Attends Religious Services: Never  . Active Member of Clubs or Organizations: No  . Attends Archivist Meetings: Never  . Marital Status: Divorced  Human resources officer Violence: Not At Risk  . Fear of Current or Ex-Partner: No  . Emotionally Abused: No  . Physically Abused: No  . Sexually Abused: No     Allergies  Allergen Reactions  . Lipitor [Atorvastatin]     UNSPECIFIED REACTION   . Codeine Itching  . Erythromycin Nausea And Vomiting    GI UPSET  . Sulfamethoxazole Itching    Over 40 years ago     Outpatient Medications Prior to Visit  Medication Sig Dispense Refill  . acetaminophen (TYLENOL) 500 MG tablet Take 500 mg by mouth. Patient reports taking 1 to 2 tablets a day if needed.    [name] albuterol (VENTOLIN HFA) 108 (90 Base) MCG/ACT inhaler Inhale 2 puffs into the lungs every 6 (six) hours as needed for wheezing or shortness of breath. 8 g 6  . allopurinol (ZYLOPRIM) 300 MG tablet TAKE 1 TABLET BY MOUTH EVERY DAY 90 tablet 0  . ALPRAZolam (XANAX) 0.5 MG tablet TAKE 1 TABLET BY MOUTH AT  BEDTIME AS NEEDED FOR  SLEEP/ANXIETY 90 tablet 0  . aspirin EC 81 MG tablet Take 81 mg by mouth daily.    [name] CALCIUM-MAGNESIUM-ZINC PO Take 1 tablet by mouth daily.    [name] docusate sodium (COLACE) 100 MG capsule Take 200 mg by mouth at bedtime.    [name] HYDROcodone-acetaminophen (NORCO) 10-325 MG tablet Take 1-2 tablets by mouth every 6 (six) hours as needed. 50 tablet 0  . latanoprost (XALATAN) 0.005 % ophthalmic solution Place 1 drop into the right eye at bedtime.    . metoprolol succinate (TOPROL-XL) 50 MG 24 hr tablet Take with or immediately following a meal. 90 tablet 3  . Multiple Vitamin (MULTIVITAMIN WITH MINERALS) TABS Take 1 tablet by mouth daily.    . Omega-3 Fatty Acids 1200 MG CAPS Take 1,200 mg by mouth daily.    [name] omeprazole (PRILOSEC) 20 MG capsule TAKE 1 CAPSULE BY MOUTH  EVERY DAY 90 capsule 0  . pravastatin (PRAVACHOL) 10 MG tablet Take 1 tablet (10 mg total) by mouth every evening. (Patient taking differently: Take 20 mg by mouth every evening.) 90 tablet 3  . Probiotic Product (PROBIOTIC ADVANCED PO) Take by mouth daily.    . sertraline (ZOLOFT) 50 MG tablet Take one half tablet by mouth once daily for 2 weeks and then increase to one tablet by mouth daily. 30 tablet 5  . spironolactone (ALDACTONE) 25 MG tablet Take 0.5 tablets (12.5 mg total) by mouth  daily. 45 tablet 3   No facility-administered medications prior to visit.    Review of Systems  Constitutional: Negative for chills, fever, malaise/fatigue and weight loss.  HENT: Negative for congestion, sinus pain and sore throat.   Eyes: Negative.   Respiratory: Positive for shortness of breath (with exertion). Negative for cough, hemoptysis, sputum production and wheezing (intermittent).   Cardiovascular: Negative for chest pain, palpitations, orthopnea, claudication, leg swelling and PND.  Gastrointestinal: Positive for heartburn. Negative for abdominal pain, nausea and vomiting.  Genitourinary: Negative.   Musculoskeletal: Negative.   Skin: Negative for rash.  Neurological: Negative for dizziness, weakness and headaches.  Endo/Heme/Allergies: Negative.   Psychiatric/Behavioral: Negative.    Objective:   Vitals:   03/08/21 1331  BP: 114/72  Pulse: 75  Temp: (!) 97.5 F (36.4 C)  TempSrc: Temporal  SpO2: 98%  Weight: 169 lb 9.6 oz (76.9 kg)  Height: 5\' 7"  (1.702 m)     Physical Exam Constitutional:      General: She is not in acute distress.    Appearance: Normal appearance. She is normal weight. She is not ill-appearing.  HENT:     Head: Normocephalic and atraumatic.  Eyes:     General: No scleral icterus.    Conjunctiva/sclera: Conjunctivae normal.  Cardiovascular:     Rate and Rhythm: Normal rate and regular rhythm.     Pulses: Normal pulses.     Heart sounds: Normal heart  sounds. No murmur heard.   Pulmonary:     Effort: Pulmonary effort is normal. No respiratory distress.     Breath sounds: Rales (right base) present. No wheezing or rhonchi.  Abdominal:     General: Bowel sounds are normal.     Palpations: Abdomen is soft.  Musculoskeletal:     Right lower leg: No edema.     Left lower leg: No edema.  Skin:    General: Skin is warm and dry.  Neurological:     General: No focal deficit present.     Mental Status: She is alert.  Psychiatric:        Mood and Affect: Mood normal.        Behavior: Behavior normal.        Thought Content: Thought content normal.        Judgment: Judgment normal.    CBC    Component Value Date/Time   WBC 8.5 10/10/2020 0830   WBC 7.8 02/17/2019 1443   RBC 4.76 10/10/2020 0830   RBC 4.53 11/24/2019 0000   HGB 14.2 10/10/2020 0830   HCT 43.0 10/10/2020 0830   PLT 324 10/10/2020 0830   MCV 90 10/10/2020 0830   MCH 29.8 10/10/2020 0830   MCH 30.2 12/10/2017 0355   MCHC 33.0 10/10/2020 0830   MCHC 33.1 02/17/2019 1443   RDW 14.5 10/10/2020 0830   LYMPHSABS 1.7 02/17/2019 1443   MONOABS 0.9 02/17/2019 1443   EOSABS 0.3 02/17/2019 1443   BASOSABS 0.1 02/17/2019 1443   BMP Latest Ref Rng & Units 10/10/2020 02/17/2019 12/10/2017  Glucose 65 - 99 mg/dL 97 129(H) 128(H)  BUN 8 - 27 mg/dL 13 16 13   Creatinine 0.57 - 1.00 mg/dL 0.99 1.09 0.88  BUN/Creat Ratio 12 - 28 13 - -  Sodium 134 - 144 mmol/L 139 138 137  Potassium 3.5 - 5.2 mmol/L 4.7 4.2 3.8  Chloride 96 - 106 mmol/L 100 101 102  CO2 20 - 29 mmol/L 25 29 24   Calcium 8.7 - 10.3 mg/dL 9.4 8.7  8.2(L)   Chest imaging: CXR 01/03/2021 Heart and mediastinal contours are within normal limits. No focal opacities or effusions. No acute bony abnormality.  CXR 02/01/2013 Findings: Cardiomediastinal silhouette is stable. No acute  infiltrate or pleural effusion. No pulmonary edema. Bony thorax  is unremarkable. Again noted chronic mild interstitial prominence.    PFT: PFT Results Latest Ref Rng & Units 01/25/2021  FVC-Pre L 1.93  FVC-Predicted Pre % 68  FVC-Post L 2.14  FVC-Predicted Post % 75  Pre FEV1/FVC % % 95  Post FEV1/FCV % % 84  FEV1-Pre L 1.84  FEV1-Predicted Pre % 87  FEV1-Post L 1.80  DLCO uncorrected ml/min/mmHg 14.24  DLCO UNC% % 72  DLCO corrected ml/min/mmHg 14.24  DLCO COR %Predicted % 72  DLVA Predicted % 102  01/25/21: Mild restrictive defect and mild diffusion defect  Echo: 10/30/2020 1. Left ventricular ejection fraction, by estimation, is 50 to 55%. The  left ventricle has low normal function. The left ventricle has no regional  wall motion abnormalities. Left ventricular diastolic parameters are  consistent with Grade I diastolic  dysfunction (impaired relaxation). The average left ventricular global  longitudinal strain is -20.4 %.  2. Right ventricular systolic function is moderately reduced. The right  ventricular size is normal. There is normal pulmonary artery systolic  pressure. The estimated right ventricular systolic pressure is 94.8 mmHg.  3. The mitral valve is normal in structure. Trivial mitral valve  regurgitation. No evidence of mitral stenosis.  4. The aortic valve is tricuspid. Aortic valve regurgitation is mild.  Mild aortic valve sclerosis is present, with no evidence of aortic valve  stenosis.  5. The inferior vena cava is normal in size with greater than 50%  respiratory variability, suggesting right atrial pressure of 3 mmHg.    Assessment & Plan:   Abnormal PFTs - Plan: CT Chest High Resolution  Dyspnea on exertion  Restrictive ventilatory defect - Plan: Home sleep test  Diffusion capacity of lung (dl), decreased  Discussion: Jessica Mcneil is a 80 year old woman, former smoker with nonischemic cardiomyopathy, hypertension, hyperlipidemia and GERD who returns to pulmonary clinic for progressive dyspnea on exertion.  She has moderate restrictive defect and mild diffusion defect  on pulmonary function testing. She also has reduced RV function on cardiac echo. This could be concerning for interstitial lung disease, pulmonary hypertension or possible underlying emphysematous changes based on her smoking history. We will check a high resolution CT chest for further evaluation. We will check a home sleep test as well for sleep disordered breathing.  She has responded well to albuterol. We will start her on spiriva respimat 2.60mcg 2 puffs daily and monitor for further benefit. She can continue albuterol as needed. .   Follow up in 2 months  Freda Jackson, MD [facility] Office: [phone]   See Amion for Pager Details    Current Outpatient Medications:  .  acetaminophen (TYLENOL) 500 MG tablet, Take 500 mg by mouth. Patient reports taking 1 to 2 tablets a day if needed., Disp: , Rfl:  .  albuterol (VENTOLIN HFA) 108 (90 Base) MCG/ACT inhaler, Inhale 2 puffs into the lungs every 6 (six) hours as needed for wheezing or shortness of breath., Disp: 8 g, Rfl: 6 .  allopurinol (ZYLOPRIM) 300 MG tablet, TAKE 1 TABLET BY MOUTH EVERY DAY, Disp: 90 tablet, Rfl: 0 .  ALPRAZolam (XANAX) 0.5 MG tablet, TAKE 1 TABLET BY MOUTH AT  BEDTIME AS NEEDED FOR  SLEEP/ANXIETY, Disp: 90  tablet, Rfl: 0 .  aspirin EC 81 MG tablet, Take 81 mg by mouth daily., Disp: , Rfl:  .  CALCIUM-MAGNESIUM-ZINC PO, Take 1 tablet by mouth daily., Disp: , Rfl:  .  docusate sodium (COLACE) 100 MG capsule, Take 200 mg by mouth at bedtime., Disp: , Rfl:  .  HYDROcodone-acetaminophen (NORCO) 10-325 MG tablet, Take 1-2 tablets by mouth every 6 (six) hours as needed., Disp: 50 tablet, Rfl: 0 .  latanoprost (XALATAN) 0.005 % ophthalmic solution, Place 1 drop into the right eye at bedtime., Disp: , Rfl:  .  metoprolol succinate (TOPROL-XL) 50 MG 24 hr tablet, Take with or immediately following a meal., Disp: 90 tablet, Rfl: 3 .  Multiple Vitamin (MULTIVITAMIN WITH MINERALS) TABS, Take 1 tablet by  mouth daily., Disp: , Rfl:  .  Omega-3 Fatty Acids 1200 MG CAPS, Take 1,200 mg by mouth daily., Disp: , Rfl:  .  omeprazole (PRILOSEC) 20 MG capsule, TAKE 1 CAPSULE BY MOUTH EVERY DAY, Disp: 90 capsule, Rfl: 0 .  pravastatin (PRAVACHOL) 10 MG tablet, Take 1 tablet (10 mg total) by mouth every evening. (Patient taking differently: Take 20 mg by mouth every evening.), Disp: 90 tablet, Rfl: 3 .  Probiotic Product (PROBIOTIC ADVANCED PO), Take by mouth daily., Disp: , Rfl:  .  sertraline (ZOLOFT) 50 MG tablet, Take one half tablet by mouth once daily for 2 weeks and then increase to one tablet by mouth daily., Disp: 30 tablet, Rfl: 5 .  spironolactone (ALDACTONE) 25 MG tablet, Take 0.5 tablets (12.5 mg total) by mouth daily., Disp: 45 tablet, Rfl: 3 .  Tiotropium Bromide Monohydrate (SPIRIVA RESPIMAT) 2.5 MCG/ACT AERS, Inhale 2 puffs into the lungs daily., Disp: 4 g, Rfl: 6 .  Tiotropium Bromide Monohydrate (SPIRIVA RESPIMAT) 2.5 MCG/ACT AERS, Inhale 2 puffs into the lungs daily., Disp: 4 g, Rfl: 0

## 2021-03-08 NOTE — Progress Notes (Signed)
Patient seen in the office today and instructed on use of Spiriva 2.69mcg.  Patient expressed understanding and demonstrated technique.  Benetta Spar [facility] 03/08/2021

## 2021-03-09 ENCOUNTER — Telehealth: Payer: Self-pay | Admitting: Pharmacist

## 2021-03-09 NOTE — Telephone Encounter (Signed)
Sounds good

## 2021-03-09 NOTE — Telephone Encounter (Addendum)
Called patient to make her aware of the medication change that she requested to switch from fluoxetine to sertraline. Patient has already taken her dose of fluoxetine today and will switch to sertraline 25 mg tomorrow. She is aware to increase to 50 mg in 2 weeks.  Scheduled a follow up with PCP in 6 weeks to follow up on the medication change. Will plan to reach out via telephone in a few weeks to see how she is tolerating.  Patient also inquired on the timing of her new Spiriva inhaler. Recommended choosing any time of day and sticking to it but does not have to be in the morning. Patient verbalized her understanding and is aware to call back with any questions.

## 2021-03-15 ENCOUNTER — Ambulatory Visit
Admission: RE | Admit: 2021-03-15 | Discharge: 2021-03-15 | Disposition: A | Discharge status: Home / Self Care | Payer: PPO | Source: Ambulatory Visit | Attending: Pulmonary Disease | Admitting: Pulmonary Disease

## 2021-03-15 DIAGNOSIS — R918 Other nonspecific abnormal finding of lung field: Secondary | ICD-10-CM | POA: Diagnosis not present

## 2021-03-15 DIAGNOSIS — R942 Abnormal results of pulmonary function studies: Secondary | ICD-10-CM

## 2021-03-31 ENCOUNTER — Other Ambulatory Visit: Payer: Self-pay | Admitting: Family Medicine

## 2021-04-02 ENCOUNTER — Telehealth: Payer: Self-pay | Admitting: Pharmacist

## 2021-04-02 NOTE — Chronic Care Management (AMB) (Signed)
    Chronic Care Management Pharmacy Assistant   Name: Jessica Mcneil  MRN: [id number] DOB: 1941-10-02  Reason for Encounter: Medication Review  Recent office visits:  None  Recent consult visits:  . 03.17.2022 [name], MD Pulmonary Disease o Medication prescribed o Tiotropium Bromide Monohydrate o 2.5 MCG/ACT Aers, 2 puffs Inhalation Daily  Hospital visits:  None in previous 6 months  Medications: Outpatient Encounter Medications as of 04/02/2021  Medication Sig  . acetaminophen (TYLENOL) 500 MG tablet Take 500 mg by mouth. Patient reports taking 1 to 2 tablets a day if needed.  [name] albuterol (VENTOLIN HFA) 108 (90 Base) MCG/ACT inhaler Inhale 2 puffs into the lungs every 6 (six) hours as needed for wheezing or shortness of breath.  . allopurinol (ZYLOPRIM) 300 MG tablet TAKE 1 TABLET BY MOUTH EVERY DAY  . ALPRAZolam (XANAX) 0.5 MG tablet TAKE 1 TABLET BY MOUTH AT  BEDTIME AS NEEDED FOR  SLEEP/ANXIETY  . aspirin EC 81 MG tablet Take 81 mg by mouth daily.  [name] CALCIUM-MAGNESIUM-ZINC PO Take 1 tablet by mouth daily.  [name] docusate sodium (COLACE) 100 MG capsule Take 200 mg by mouth at bedtime.  [name] HYDROcodone-acetaminophen (NORCO) 10-325 MG tablet Take 1-2 tablets by mouth every 6 (six) hours as needed.  . latanoprost (XALATAN) 0.005 % ophthalmic solution Place 1 drop into the right eye at bedtime.  . metoprolol succinate (TOPROL-XL) 50 MG 24 hr tablet Take with or immediately following a meal.  . Multiple Vitamin (MULTIVITAMIN WITH MINERALS) TABS Take 1 tablet by mouth daily.  . Omega-3 Fatty Acids 1200 MG CAPS Take 1,200 mg by mouth daily.  [name] omeprazole (PRILOSEC) 20 MG capsule TAKE 1 CAPSULE BY MOUTH EVERY DAY  . pravastatin (PRAVACHOL) 10 MG tablet Take 1 tablet (10 mg total) by mouth every evening. (Patient taking differently: Take 20 mg by mouth every evening.)  . Probiotic Product (PROBIOTIC ADVANCED PO) Take by mouth daily.  . sertraline (ZOLOFT) 50 MG tablet  TAKE ONE HALF TABLET BY MOUTH ONCE DAILY FOR 2 WEEKS AND THEN INCREASE TO ONE TABLET BY MOUTH DAILY.  [name] spironolactone (ALDACTONE) 25 MG tablet Take 0.5 tablets (12.5 mg total) by mouth daily.  . Tiotropium Bromide Monohydrate (SPIRIVA RESPIMAT) 2.5 MCG/ACT AERS Inhale 2 puffs into the lungs daily.  . Tiotropium Bromide Monohydrate (SPIRIVA RESPIMAT) 2.5 MCG/ACT AERS Inhale 2 puffs into the lungs daily.   No facility-administered encounter medications on file as of 04/02/2021.   Attempted on several occasions to contact patient for general adherence call. Patient has not returned calls.  Star Rating Drugs:  Dispensed Quantity Pharmacy  Pravastatin 20 mg 10.04.2021 [facility]    [name], [name] [phone]

## 2021-04-03 ENCOUNTER — Ambulatory Visit: Payer: PPO

## 2021-04-03 ENCOUNTER — Other Ambulatory Visit: Payer: Self-pay

## 2021-04-03 DIAGNOSIS — G4733 Obstructive sleep apnea (adult) (pediatric): Secondary | ICD-10-CM | POA: Diagnosis not present

## 2021-04-03 DIAGNOSIS — R942 Abnormal results of pulmonary function studies: Secondary | ICD-10-CM

## 2021-04-05 DIAGNOSIS — G4733 Obstructive sleep apnea (adult) (pediatric): Secondary | ICD-10-CM

## 2021-04-07 ENCOUNTER — Other Ambulatory Visit: Payer: Self-pay | Admitting: Family Medicine

## 2021-04-23 ENCOUNTER — Ambulatory Visit: Payer: PPO | Admitting: Family Medicine

## 2021-04-23 DIAGNOSIS — Z0289 Encounter for other administrative examinations: Secondary | ICD-10-CM

## 2021-05-07 ENCOUNTER — Telehealth: Payer: Self-pay | Admitting: Pharmacist

## 2021-05-09 ENCOUNTER — Other Ambulatory Visit: Payer: Self-pay

## 2021-05-09 ENCOUNTER — Encounter: Payer: Self-pay | Admitting: Pulmonary Disease

## 2021-05-09 ENCOUNTER — Ambulatory Visit: Payer: PPO | Admitting: Pulmonary Disease

## 2021-05-09 VITALS — BP 106/70 | HR 74 | Ht 67.0 in | Wt 168.0 lb

## 2021-05-09 DIAGNOSIS — K219 Gastro-esophageal reflux disease without esophagitis: Secondary | ICD-10-CM | POA: Diagnosis not present

## 2021-05-09 DIAGNOSIS — K589 Irritable bowel syndrome without diarrhea: Secondary | ICD-10-CM | POA: Insufficient documentation

## 2021-05-09 DIAGNOSIS — J849 Interstitial pulmonary disease, unspecified: Secondary | ICD-10-CM | POA: Diagnosis not present

## 2021-05-09 DIAGNOSIS — G4730 Sleep apnea, unspecified: Secondary | ICD-10-CM

## 2021-05-09 DIAGNOSIS — K5730 Diverticulosis of large intestine without perforation or abscess without bleeding: Secondary | ICD-10-CM | POA: Insufficient documentation

## 2021-05-09 MED ORDER — TRELEGY ELLIPTA 100-62.5-25 MCG/INH IN AEPB: 1.0000 | INHALATION_SPRAY | Freq: Every day | RESPIRATORY_TRACT | 0 refills | Status: DC

## 2021-05-09 MED ORDER — OMEPRAZOLE 40 MG PO CPDR: 40.0000 mg | DELAYED_RELEASE_CAPSULE | Freq: Every day | ORAL | 3 refills | Status: DC

## 2021-05-09 NOTE — Progress Notes (Signed)
Synopsis: Referred in 12/2020 for shortness of breath by Ermalinda Barrios, PA  Subjective:   PATIENT ID: Jessica Mcneil GENDER: female DOB: [birth year], MRN: [id number]   HPI  Chief Complaint  Patient presents with  . Shortness of Breath    Better since pollen has gotten better   Jessica Mcneil is a 80 year old woman, former smoker with nonischemic cardiomyopathy, hypertension, hyperlipidemia and GERD who returns to pulmonary clinic for progressive dyspnea on exertion.   She had PFTs done on 01/25/21 and she has moderate restrictive defect and mild diffusion defect.   High resolution CT chest on 03/15/21 shows mild patchy confluent subpleural reticulation and ground glass opacities in both lungs with mild traction bronchiectasis.   She has benefited from daily Spiriva use and as needed albuterol use. She does report coughing up mucous after using Spiriva in the mornings.    She continues to have frequent GERD symptoms despite being on 20mg  of omeprazole a day. She has a small hiatal hernia noted on CT chest.   Past Medical History:  Diagnosis Date  . Anxiety   . Arthritis   . CHF (congestive heart failure) ([place])    due to non ischemic cardiomyopathy  . Depression   . GERD (gastroesophageal reflux disease)   . Headache(784.0)    migraines  . HLD (hyperlipidemia)   . HTN (hypertension)   . Overweight(278.02)   . Pneumonia   . PONV (postoperative nausea and vomiting)    long time ago none recent  . 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 Shortness of breath      Family History  Problem Relation Age of Onset  . Heart disease Father   . Arthritis Other   . Hyperlipidemia Other   . Breast cancer Neg Hx      Social History   Socioeconomic History  . Marital status: Divorced    Spouse name: Not on file  . Number of children: Not on file  . Years of education: Not on file  . Highest education level: Not on file  Occupational History  . Occupation:  retired  Tobacco Use  . Smoking status: Former Smoker    Packs/day: 2.00    Types: Cigarettes    Quit date: 12/23/1982    Years since quitting: 38.4  . Smokeless tobacco: Never Used  Vaping Use  . Vaping Use: Never used  Substance and Sexual Activity  . Alcohol use: No  . Drug use: No  . Sexual activity: Not on file  Other Topics Concern  . Not on file  Social History Narrative  . Not on file   Social Determinants of Health   Financial Resource Strain: Low Risk   . Difficulty of Paying Living Expenses: Not hard at all  Food Insecurity: No Food Insecurity  . Worried About Charity fundraiser in the Last Year: Never true  . Ran Out of Food in the Last Year: Never true  Transportation Needs: No Transportation Needs  . Lack of Transportation (Medical): No  . Lack of Transportation (Non-Medical): No  Physical Activity: Insufficiently Active  . Days of Exercise per Week: 7 days  . Minutes of Exercise per Session: 20 min  Stress: No Stress Concern Present  . Feeling of Stress : Only a little  Social Connections: Socially Isolated  . Frequency of Communication with Friends and Family: Twice a week  . Frequency of Social Gatherings with Friends and Family: Three times a week  . Attends  Religious Services: Never  . Active Member of Clubs or Organizations: No  . Attends Archivist Meetings: Never  . Marital Status: Divorced  Human resources officer Violence: Not At Risk  . Fear of Current or Ex-Partner: No  . Emotionally Abused: No  . Physically Abused: No  . Sexually Abused: No     Allergies  Allergen Reactions  . Lipitor [Atorvastatin]     UNSPECIFIED REACTION   . Codeine Itching  . Erythromycin Nausea And Vomiting    GI UPSET  . Sulfamethoxazole Itching    Over 40 years ago     Outpatient Medications Prior to Visit  Medication Sig Dispense Refill  . acetaminophen (TYLENOL) 500 MG tablet Take 500 mg by mouth. Patient reports taking 1 to 2 tablets a day if needed.     [name] albuterol (VENTOLIN HFA) 108 (90 Base) MCG/ACT inhaler Inhale 2 puffs into the lungs every 6 (six) hours as needed for wheezing or shortness of breath. 8 g 6  . allopurinol (ZYLOPRIM) 300 MG tablet TAKE 1 TABLET BY MOUTH EVERY DAY 90 tablet 0  . ALPRAZolam (XANAX) 0.5 MG tablet TAKE 1 TABLET BY MOUTH AT  BEDTIME AS NEEDED FOR  SLEEP/ANXIETY 90 tablet 0  . aspirin EC 81 MG tablet Take 81 mg by mouth daily.    [name] CALCIUM-MAGNESIUM-ZINC PO Take 1 tablet by mouth daily.    [name] docusate sodium (COLACE) 100 MG capsule Take 200 mg by mouth at bedtime.    [name] HYDROcodone-acetaminophen (NORCO) 10-325 MG tablet Take 1-2 tablets by mouth every 6 (six) hours as needed. 50 tablet 0  . latanoprost (XALATAN) 0.005 % ophthalmic solution Place 1 drop into the right eye at bedtime.    . metoprolol succinate (TOPROL-XL) 50 MG 24 hr tablet Take with or immediately following a meal. 90 tablet 3  . Multiple Vitamin (MULTIVITAMIN WITH MINERALS) TABS Take 1 tablet by mouth daily.    . Omega-3 Fatty Acids 1200 MG CAPS Take 1,200 mg by mouth daily.    [name] omeprazole (PRILOSEC) 20 MG capsule TAKE 1 CAPSULE BY MOUTH EVERY DAY 90 capsule 0  . pravastatin (PRAVACHOL) 10 MG tablet Take 1 tablet (10 mg total) by mouth every evening. (Patient taking differently: Take 20 mg by mouth every evening.) 90 tablet 3  . Probiotic Product (PROBIOTIC ADVANCED PO) Take by mouth daily.    . sertraline (ZOLOFT) 50 MG tablet TAKE ONE HALF TABLET BY MOUTH ONCE DAILY FOR 2 WEEKS AND THEN INCREASE TO ONE TABLET BY MOUTH DAILY. 90 tablet 2  . spironolactone (ALDACTONE) 25 MG tablet Take 0.5 tablets (12.5 mg total) by mouth daily. 45 tablet 3  . Tiotropium Bromide Monohydrate (SPIRIVA RESPIMAT) 2.5 MCG/ACT AERS Inhale 2 puffs into the lungs daily. 4 g 6  . Tiotropium Bromide Monohydrate (SPIRIVA RESPIMAT) 2.5 MCG/ACT AERS Inhale 2 puffs into the lungs daily. 4 g 0   No facility-administered medications prior to visit.    Review of Systems   Constitutional: Negative for chills, fever, malaise/fatigue and weight loss.  HENT: Negative for congestion, sinus pain and sore throat.   Eyes: Negative.   Respiratory: Positive for shortness of breath (with exertion). Negative for cough, hemoptysis, sputum production and wheezing (intermittent).   Cardiovascular: Negative for chest pain, palpitations, orthopnea, claudication, leg swelling and PND.  Gastrointestinal: Positive for heartburn. Negative for abdominal pain, nausea and vomiting.  Genitourinary: Negative.   Musculoskeletal: Negative.   Skin: Negative for rash.  Neurological: Negative for dizziness, weakness and  headaches.  Endo/Heme/Allergies: Negative.   Psychiatric/Behavioral: Negative.    Objective:   Vitals:   05/09/21 1332  BP: 106/70  Pulse: 74  SpO2: 98%  Weight: 168 lb (76.2 kg)  Height: 5\' 7"  (1.702 m)     Physical Exam Constitutional:      General: She is not in acute distress.    Appearance: Normal appearance. She is normal weight. She is not ill-appearing.  HENT:     Head: Normocephalic and atraumatic.  Eyes:     General: No scleral icterus.    Conjunctiva/sclera: Conjunctivae normal.  Cardiovascular:     Rate and Rhythm: Normal rate and regular rhythm.     Pulses: Normal pulses.     Heart sounds: Normal heart sounds. No murmur heard.   Pulmonary:     Effort: Pulmonary effort is normal. No respiratory distress.     Breath sounds: No wheezing, rhonchi or rales.  Abdominal:     General: Bowel sounds are normal.     Palpations: Abdomen is soft.  Musculoskeletal:     Right lower leg: No edema.     Left lower leg: No edema.  Skin:    General: Skin is warm and dry.  Neurological:     General: No focal deficit present.     Mental Status: She is alert.  Psychiatric:        Mood and Affect: Mood normal.        Behavior: Behavior normal.        Thought Content: Thought content normal.        Judgment: Judgment normal.    CBC    Component  Value Date/Time   WBC 8.5 10/10/2020 0830   WBC 7.8 02/17/2019 1443   RBC 4.76 10/10/2020 0830   RBC 4.53 11/24/2019 0000   HGB 14.2 10/10/2020 0830   HCT 43.0 10/10/2020 0830   PLT 324 10/10/2020 0830   MCV 90 10/10/2020 0830   MCH 29.8 10/10/2020 0830   MCH 30.2 12/10/2017 0355   MCHC 33.0 10/10/2020 0830   MCHC 33.1 02/17/2019 1443   RDW 14.5 10/10/2020 0830   LYMPHSABS 1.7 02/17/2019 1443   MONOABS 0.9 02/17/2019 1443   EOSABS 0.3 02/17/2019 1443   BASOSABS 0.1 02/17/2019 1443   BMP Latest Ref Rng & Units 10/10/2020 02/17/2019 12/10/2017  Glucose 65 - 99 mg/dL 97 129(H) 128(H)  BUN 8 - 27 mg/dL 13 16 13   Creatinine 0.57 - 1.00 mg/dL 0.99 1.09 0.88  BUN/Creat Ratio 12 - 28 13 - -  Sodium 134 - 144 mmol/L 139 138 137  Potassium 3.5 - 5.2 mmol/L 4.7 4.2 3.8  Chloride 96 - 106 mmol/L 100 101 102  CO2 20 - 29 mmol/L 25 29 24   Calcium 8.7 - 10.3 mg/dL 9.4 8.7 8.2(L)   Chest imaging: HRCT Chest 03/15/21  Two solid right middle lobe pulmonary nodules, largest 5 mm (series 8/image 84). Noadditional significant pulmonary nodules. No significant air trapping or evidence of tracheobronchomalacia on the expiration sequence. Mild patchy confluent subpleural reticulation and ground-glass opacity in both lungs with associated mild traction bronchiectasis and minimal architectural distortion. There is a basilar predominance to these findings. No frank honeycombing.  CXR 01/03/2021 Heart and mediastinal contours are within normal limits. No focal opacities or effusions. No acute bony abnormality.  CXR 02/01/2013 Findings: Cardiomediastinal silhouette is stable. No acute  infiltrate or pleural effusion. No pulmonary edema. Bony thorax  is unremarkable. Again noted chronic mild interstitial prominence.   PFT: PFT  Results Latest Ref Rng & Units 01/25/2021  FVC-Pre L 1.93  FVC-Predicted Pre % 68  FVC-Post L 2.14  FVC-Predicted Post % 75  Pre FEV1/FVC % % 95  Post FEV1/FCV % % 84   FEV1-Pre L 1.84  FEV1-Predicted Pre % 87  FEV1-Post L 1.80  DLCO uncorrected ml/min/mmHg 14.24  DLCO UNC% % 72  DLCO corrected ml/min/mmHg 14.24  DLCO COR %Predicted % 72  DLVA Predicted % 102  01/25/21: Mild restrictive defect and mild diffusion defect  Echo: 10/30/2020 1. Left ventricular ejection fraction, by estimation, is 50 to 55%. The  left ventricle has low normal function. The left ventricle has no regional  wall motion abnormalities. Left ventricular diastolic parameters are  consistent with Grade I diastolic  dysfunction (impaired relaxation). The average left ventricular global  longitudinal strain is -20.4 %.  2. Right ventricular systolic function is moderately reduced. The right  ventricular size is normal. There is normal pulmonary artery systolic  pressure. The estimated right ventricular systolic pressure is 47.4 mmHg.  3. The mitral valve is normal in structure. Trivial mitral valve  regurgitation. No evidence of mitral stenosis.  4. The aortic valve is tricuspid. Aortic valve regurgitation is mild.  Mild aortic valve sclerosis is present, with no evidence of aortic valve  stenosis.  5. The inferior vena cava is normal in size with greater than 50%  respiratory variability, suggesting right atrial pressure of 3 mmHg.   Home Sleep Study 04/03/21 Mild Obstructive Sleep apnea - 5.3 AHI/hr, nocturnal hypoxia  Assessment & Plan:   ILD (interstitial lung disease) ([place])  Discussion: Jessica Mcneil is a 80 year old woman, former smoker with nonischemic cardiomyopathy, hypertension, hyperlipidemia and GERD who returns to pulmonary clinic for progressive dyspnea on exertion.  She has mild interstitial lung disease involvement as noted by her recent high resolution CT chest showing subpleural reticulation and mild traction bronchiectasis. She has moderate restrictive defect and mild diffusion defect on pulmonary function testing. The etiology of her lung disease is  likely secondary to her previous history of smoking and ongoing reflux symptoms.   We will increase her omeprazole to 40mg  daily and recommended she continue to sleep with her head elevated. We will trial her on Trelegy Ellipta today as she has benefitted from spiriva and PRN albuterol. If she notices benefit, then we will send in a prescription.  We will check labs today to evaluate for possible inflammatory component of her lung disease.   She had sleep study performed which showed mild obstructive sleep apnea and nocturnal hypoxia. Given her reduced RV function on cardiac echo, I did recommend starting nocturnal oxygen therapy but she wishes to hold off at this time and we will discuss at the next visit.    Follow up in 6 months  Freda Jackson, MD [facility] Office: [phone]    Current Outpatient Medications:  .  acetaminophen (TYLENOL) 500 MG tablet, Take 500 mg by mouth. Patient reports taking 1 to 2 tablets a day if needed., Disp: , Rfl:  .  albuterol (VENTOLIN HFA) 108 (90 Base) MCG/ACT inhaler, Inhale 2 puffs into the lungs every 6 (six) hours as needed for wheezing or shortness of breath., Disp: 8 g, Rfl: 6 .  allopurinol (ZYLOPRIM) 300 MG tablet, TAKE 1 TABLET BY MOUTH EVERY DAY, Disp: 90 tablet, Rfl: 0 .  ALPRAZolam (XANAX) 0.5 MG tablet, TAKE 1 TABLET BY MOUTH AT  BEDTIME AS NEEDED FOR  SLEEP/ANXIETY, Disp: 90 tablet, Rfl:  0 .  aspirin EC 81 MG tablet, Take 81 mg by mouth daily., Disp: , Rfl:  .  CALCIUM-MAGNESIUM-ZINC PO, Take 1 tablet by mouth daily., Disp: , Rfl:  .  docusate sodium (COLACE) 100 MG capsule, Take 200 mg by mouth at bedtime., Disp: , Rfl:  .  HYDROcodone-acetaminophen (NORCO) 10-325 MG tablet, Take 1-2 tablets by mouth every 6 (six) hours as needed., Disp: 50 tablet, Rfl: 0 .  latanoprost (XALATAN) 0.005 % ophthalmic solution, Place 1 drop into the right eye at bedtime., Disp: , Rfl:  .  metoprolol succinate (TOPROL-XL) 50 MG 24 hr  tablet, Take with or immediately following a meal., Disp: 90 tablet, Rfl: 3 .  Multiple Vitamin (MULTIVITAMIN WITH MINERALS) TABS, Take 1 tablet by mouth daily., Disp: , Rfl:  .  Omega-3 Fatty Acids 1200 MG CAPS, Take 1,200 mg by mouth daily., Disp: , Rfl:  .  omeprazole (PRILOSEC) 20 MG capsule, TAKE 1 CAPSULE BY MOUTH EVERY DAY, Disp: 90 capsule, Rfl: 0 .  pravastatin (PRAVACHOL) 10 MG tablet, Take 1 tablet (10 mg total) by mouth every evening. (Patient taking differently: Take 20 mg by mouth every evening.), Disp: 90 tablet, Rfl: 3 .  Probiotic Product (PROBIOTIC ADVANCED PO), Take by mouth daily., Disp: , Rfl:  .  sertraline (ZOLOFT) 50 MG tablet, TAKE ONE HALF TABLET BY MOUTH ONCE DAILY FOR 2 WEEKS AND THEN INCREASE TO ONE TABLET BY MOUTH DAILY., Disp: 90 tablet, Rfl: 2 .  spironolactone (ALDACTONE) 25 MG tablet, Take 0.5 tablets (12.5 mg total) by mouth daily., Disp: 45 tablet, Rfl: 3 .  Tiotropium Bromide Monohydrate (SPIRIVA RESPIMAT) 2.5 MCG/ACT AERS, Inhale 2 puffs into the lungs daily., Disp: 4 g, Rfl: 6 .  Tiotropium Bromide Monohydrate (SPIRIVA RESPIMAT) 2.5 MCG/ACT AERS, Inhale 2 puffs into the lungs daily., Disp: 4 g, Rfl: 0

## 2021-05-09 NOTE — Patient Instructions (Addendum)
Try Breztri inhaler 2 puffs twice daily - rinse mouth out after each use  Stop using spiriva while using the Breztri  Continue to use albuterol as needed  We will check labs today for the interstitial lung disease  We will increase your omeprazole to 40mg  daily.

## 2021-05-09 NOTE — Addendum Note (Signed)
Addended by: Amado Coe on: 05/09/2021 02:26 PM   Modules accepted: Orders

## 2021-05-10 ENCOUNTER — Other Ambulatory Visit: Payer: Self-pay | Admitting: Family Medicine

## 2021-05-10 NOTE — Chronic Care Management (AMB) (Signed)
    Chronic Care Management Pharmacy Assistant   Name: Naome Brigandi  MRN: [id number] DOB: 26-Jan-1941  Reason for Encounter: Medication Review   Recent office visits:  None  Recent consult visits:  05.19.2022 [name], MD Pulmonary Disease  Medication prescribed and changes  . Fluticasone-Umeclidin-Vilant 100-62.5-25 MCG/INH 1 puff Inhalation Daily . Omeprazole 40 mg Oral Daily   Hospital visits:  None in previous 6 months  Medications: Outpatient Encounter Medications as of 05/07/2021  Medication Sig  . acetaminophen (TYLENOL) 500 MG tablet Take 500 mg by mouth. Patient reports taking 1 to 2 tablets a day if needed.  [name] albuterol (VENTOLIN HFA) 108 (90 Base) MCG/ACT inhaler Inhale 2 puffs into the lungs every 6 (six) hours as needed for wheezing or shortness of breath.  . ALPRAZolam (XANAX) 0.5 MG tablet TAKE 1 TABLET BY MOUTH AT  BEDTIME AS NEEDED FOR  SLEEP/ANXIETY  . aspirin EC 81 MG tablet Take 81 mg by mouth daily.  [name] CALCIUM-MAGNESIUM-ZINC PO Take 1 tablet by mouth daily.  [name] docusate sodium (COLACE) 100 MG capsule Take 200 mg by mouth at bedtime.  [name] HYDROcodone-acetaminophen (NORCO) 10-325 MG tablet Take 1-2 tablets by mouth every 6 (six) hours as needed.  . latanoprost (XALATAN) 0.005 % ophthalmic solution Place 1 drop into the right eye at bedtime.  . metoprolol succinate (TOPROL-XL) 50 MG 24 hr tablet Take with or immediately following a meal.  . Multiple Vitamin (MULTIVITAMIN WITH MINERALS) TABS Take 1 tablet by mouth daily.  . Omega-3 Fatty Acids 1200 MG CAPS Take 1,200 mg by mouth daily.  . pravastatin (PRAVACHOL) 10 MG tablet Take 1 tablet (10 mg total) by mouth every evening. (Patient taking differently: Take 20 mg by mouth every evening.)  . Probiotic Product (PROBIOTIC ADVANCED PO) Take by mouth daily.  . sertraline (ZOLOFT) 50 MG tablet TAKE ONE HALF TABLET BY MOUTH ONCE DAILY FOR 2 WEEKS AND THEN INCREASE TO ONE TABLET BY MOUTH DAILY.  [name]  spironolactone (ALDACTONE) 25 MG tablet Take 0.5 tablets (12.5 mg total) by mouth daily.  . Tiotropium Bromide Monohydrate (SPIRIVA RESPIMAT) 2.5 MCG/ACT AERS Inhale 2 puffs into the lungs daily.  . Tiotropium Bromide Monohydrate (SPIRIVA RESPIMAT) 2.5 MCG/ACT AERS Inhale 2 puffs into the lungs daily.  . [DISCONTINUED] allopurinol (ZYLOPRIM) 300 MG tablet TAKE 1 TABLET BY MOUTH EVERY DAY  . [DISCONTINUED] omeprazole (PRILOSEC) 20 MG capsule TAKE 1 CAPSULE BY MOUTH EVERY DAY   No facility-administered encounter medications on file as of 05/07/2021.    Attempted on several occasions to contact patient for general adherence call. Patient has not returned calls  Star Rating Drugs: Pravastatin 20 mg: 10.04.2021 [facility]  [place] ([place]) Mare Ferrari, [place] Pharmacist Assistant [phone]

## 2021-05-11 LAB — ANA,IFA RA DIAG PNL W/RFLX TIT/PATN
Anti Nuclear Antibody (ANA): NEGATIVE
Cyclic Citrullin Peptide Ab: <16 UNITS
Rheumatoid fact SerPl-aCnc: <14 IU/mL (ref <14)

## 2021-05-11 LAB — ANCA SCREEN W REFLEX TITER: ANCA Screen: NEGATIVE

## 2021-05-22 ENCOUNTER — Other Ambulatory Visit: Payer: Self-pay | Admitting: Family Medicine

## 2021-05-22 DIAGNOSIS — Z1231 Encounter for screening mammogram for malignant neoplasm of breast: Secondary | ICD-10-CM

## 2021-06-07 DIAGNOSIS — H401423 Capsular glaucoma with pseudoexfoliation of lens, left eye, severe stage: Secondary | ICD-10-CM | POA: Diagnosis not present

## 2021-06-20 ENCOUNTER — Ambulatory Visit (INDEPENDENT_AMBULATORY_CARE_PROVIDER_SITE_OTHER): Payer: PPO | Admitting: Family Medicine

## 2021-06-20 ENCOUNTER — Other Ambulatory Visit: Payer: Self-pay

## 2021-06-20 VITALS — BP 124/64 | HR 75 | Temp 97.9°F | Ht 67.0 in | Wt 167.5 lb

## 2021-06-20 DIAGNOSIS — Z0000 Encounter for general adult medical examination without abnormal findings: Secondary | ICD-10-CM

## 2021-06-20 NOTE — Progress Notes (Signed)
Established Patient Office Visit  Subjective:  Patient ID: Jessica Mcneil, female    DOB: 1941/06/24  Age: 80 y.o. MRN: [id number]  CC:  Chief Complaint  Patient presents with   Annual Exam    No new concerns,needs refills    HPI [name] [facility] presents for physical exam.  She has history of combined systolic and diastolic heart failure, hypertension, migraine headaches, GERD, IBS, osteoarthritis.  She had ongoing dyspnea and was referred by cardiology to pulmonary with concern that she may have more than just cardiac explanation for her dyspnea.  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  She is now taking a couple inhalers which she thinks is helped slightly.  She is trying to walk currently half mile daily but still has some dyspnea which limits.  No recent chest pains.  No peripheral edema.  She has a couple small nodules followed by pulmonary.  She has been inconsistent with taking her pravastatin.  She apparently had some confusion that she did not have refills but records show that she had this filled for a year by cardiology back in October.  She is establishing with new cardiologist in July.  Health maintenance reviewed:  -She gets annual flu vaccines -Tetanus due 2027 -No history of shingles vaccine.  She apparently had concerns regarding cost after checking with insurance coverage -No history of hepatitis C screening but low risk -Has had COVID-vaccine with 1 booster but not second -Has had both Prevnar 13 and Pneumovax -DEXA scan 9/20 -Scheduled for mammogram in July -No indication for further Pap smears.  She had previous hysterectomy and also based on age. -Was told by GI no further colonoscopy secondary to age  Family history reviewed and as below.  Social history-she is divorced.  Quit smoking 1984.  No regular alcohol use.  Past Medical History:  Diagnosis Date   Anxiety    Arthritis    CHF (congestive  heart failure) ([place])    due to non ischemic cardiomyopathy   Depression    GERD (gastroesophageal reflux disease)    Headache(784.0)    migraines   HLD (hyperlipidemia)    HTN (hypertension)    Overweight(278.02)    Pneumonia    PONV (postoperative nausea and vomiting)    long time ago none recent   Right rotator cuff tear arthropathy 12/09/2017   Shortness of breath     Past Surgical History:  Procedure Laterality Date   ANTERIOR AND POSTERIOR REPAIR  2003   bladder   APPENDECTOMY     BELPHAROPTOSIS REPAIR     bilat   BREAST BIOPSY Right    BREAST LUMPECTOMY WITH NEEDLE LOCALIZATION Right 02/12/2013   Procedure: BREAST LUMPECTOMY WITH NEEDLE LOCALIZATION;  Surgeon: Merrie Roof, MD;  Location: [place];  Service: General;  Laterality: Right;   CHOLECYSTECTOMY     COLONOSCOPY     EYE SURGERY     Glaucoma Implant   TONSILLECTOMY     TOTAL SHOULDER ARTHROPLASTY Right 12/09/2017   Procedure: REVERSE TOTAL SHOULDER ARTHROPLASTY;  Surgeon: Marchia Bond, MD;  Location: [place];  Service: Orthopedics;  Laterality: Right;   VAGINAL HYSTERECTOMY  1998    Family History  Problem Relation Age of Onset   Heart disease Father    Arthritis Other    Hyperlipidemia Other    Breast cancer Neg Hx     Social History   Socioeconomic History   Marital status: Divorced  Spouse name: Not on file   Number of children: Not on file   Years of education: Not on file   Highest education level: Not on file  Occupational History   Occupation: retired  Tobacco Use   Smoking status: Former    Packs/day: 2.00    Pack years: 0.00    Types: Cigarettes    Quit date: 12/23/1982    Years since quitting: 38.5   Smokeless tobacco: Never  Vaping Use   Vaping Use: Never used  Substance and Sexual Activity   Alcohol use: No   Drug use: No   Sexual activity: Not on file  Other Topics Concern   Not on file  Social History Narrative   Not on file   Social Determinants of  Health   Financial Resource Strain: Low Risk    Difficulty of Paying Living Expenses: Not hard at all  Food Insecurity: No Food Insecurity   Worried About Charity fundraiser in the Last Year: Never true   [place] in the Last Year: Never true  Transportation Needs: No Transportation Needs   Lack of Transportation (Medical): No   Lack of Transportation (Non-Medical): No  Physical Activity: Insufficiently Active   Days of Exercise per Week: 7 days   Minutes of Exercise per Session: 20 min  Stress: No Stress Concern Present   Feeling of Stress : Only a little  Social Connections: Socially Isolated   Frequency of Communication with Friends and Family: Twice a week   Frequency of Social Gatherings with Friends and Family: Three times a week   Attends Religious Services: Never   Active Member of Clubs or Organizations: No   Attends Archivist Meetings: Never   Marital Status: Divorced  Human resources officer Violence: Not At Risk   Fear of Current or Ex-Partner: No   Emotionally Abused: No   Physically Abused: No   Sexually Abused: No    Outpatient Medications Prior to Visit  Medication Sig Dispense Refill   acetaminophen (TYLENOL) 500 MG tablet Take 500 mg by mouth. Patient reports taking 1 to 2 tablets a day if needed.     albuterol (VENTOLIN HFA) 108 (90 Base) MCG/ACT inhaler Inhale 2 puffs into the lungs every 6 (six) hours as needed for wheezing or shortness of breath. 8 g 6   allopurinol (ZYLOPRIM) 300 MG tablet TAKE 1 TABLET BY MOUTH EVERY DAY 90 tablet 0   ALPRAZolam (XANAX) 0.5 MG tablet TAKE 1 TABLET BY MOUTH AT  BEDTIME AS NEEDED FOR  SLEEP/ANXIETY 90 tablet 0   aspirin EC 81 MG tablet Take 81 mg by mouth daily.     CALCIUM-MAGNESIUM-ZINC PO Take 1 tablet by mouth daily.     docusate sodium (COLACE) 100 MG capsule Take 200 mg by mouth at bedtime.     Fluticasone-Umeclidin-Vilant (TRELEGY ELLIPTA) 100-62.5-25 MCG/INH AEPB Inhale 1 puff into the lungs daily. 2  each 0   HYDROcodone-acetaminophen (NORCO) 10-325 MG tablet Take 1-2 tablets by mouth every 6 (six) hours as needed. 50 tablet 0   latanoprost (XALATAN) 0.005 % ophthalmic solution Place 1 drop into the right eye at bedtime.     metoprolol succinate (TOPROL-XL) 50 MG 24 hr tablet Take with or immediately following a meal. 90 tablet 3   Multiple Vitamin (MULTIVITAMIN WITH MINERALS) TABS Take 1 tablet by mouth daily.     Omega-3 Fatty Acids 1200 MG CAPS Take 1,200 mg by mouth daily.     omeprazole (PRILOSEC)  40 MG capsule Take 1 capsule (40 mg total) by mouth daily. 90 capsule 3   pravastatin (PRAVACHOL) 10 MG tablet Take 1 tablet (10 mg total) by mouth every evening. (Patient taking differently: Take 20 mg by mouth every evening.) 90 tablet 3   Probiotic Product (PROBIOTIC ADVANCED PO) Take by mouth daily.     sertraline (ZOLOFT) 50 MG tablet TAKE ONE HALF TABLET BY MOUTH ONCE DAILY FOR 2 WEEKS AND THEN INCREASE TO ONE TABLET BY MOUTH DAILY. 90 tablet 2   spironolactone (ALDACTONE) 25 MG tablet Take 0.5 tablets (12.5 mg total) by mouth daily. 45 tablet 3   Tiotropium Bromide Monohydrate (SPIRIVA RESPIMAT) 2.5 MCG/ACT AERS Inhale 2 puffs into the lungs daily. 4 g 6   Tiotropium Bromide Monohydrate (SPIRIVA RESPIMAT) 2.5 MCG/ACT AERS Inhale 2 puffs into the lungs daily. 4 g 0   No facility-administered medications prior to visit.    Allergies  Allergen Reactions   Lipitor [Atorvastatin]     UNSPECIFIED REACTION    Codeine Itching   Erythromycin Nausea And Vomiting    GI UPSET   Sulfamethoxazole Itching    Over 40 years ago    ROS Review of Systems  Constitutional:  Negative for chills, fatigue and fever.  Eyes:  Negative for visual disturbance.  Respiratory:  Positive for shortness of breath. Negative for cough, chest tightness and wheezing.   Cardiovascular:  Negative for chest pain, palpitations and leg swelling.  Musculoskeletal:  Positive for arthralgias.  Neurological:  Negative  for dizziness, seizures, syncope, weakness, light-headedness and headaches.     Objective:    Physical Exam Constitutional:      Appearance: She is well-developed.  HENT:     Right Ear: Tympanic membrane normal.     Left Ear: Tympanic membrane normal.  Eyes:     Pupils: Pupils are equal, round, and reactive to light.  Neck:     Thyroid: No thyromegaly.     Vascular: No JVD.  Cardiovascular:     Rate and Rhythm: Normal rate and regular rhythm.     Heart sounds:    No gallop.  Pulmonary:     Effort: Pulmonary effort is normal. No respiratory distress.     Comments: Does have some faint crackles in both bases. Musculoskeletal:     Cervical back: Neck supple.     Right lower leg: No edema.     Left lower leg: No edema.  Neurological:     General: No focal deficit present.     Mental Status: She is alert.    BP 124/64 (BP Location: Left Arm, Patient Position: Sitting, Cuff Size: Normal)   Pulse 75   Temp 97.9 F (36.6 C) (Oral)   Ht 5\' 7"  (1.702 m)   Wt 167 lb 8 oz (76 kg)   SpO2 96%   BMI 26.23 kg/m  Wt Readings from Last 3 Encounters:  06/20/21 167 lb 8 oz (76 kg)  05/09/21 168 lb (76.2 kg)  03/08/21 169 lb 9.6 oz (76.9 kg)     Health Maintenance Due  Topic Date Due   Hepatitis C Screening  Never done   Zoster Vaccines- Shingrix (1 of 2) Never done   COVID-19 Vaccine (4 - Booster for Pfizer series) 01/21/2021    There are no preventive care reminders to display for this patient.  Lab Results  Component Value Date   TSH 4.360 10/10/2020   Lab Results  Component Value Date   WBC 8.5 10/10/2020  HGB 14.2 10/10/2020   HCT 43.0 10/10/2020   MCV 90 10/10/2020   PLT 324 10/10/2020   Lab Results  Component Value Date   NA 139 10/10/2020   K 4.7 10/10/2020   CO2 25 10/10/2020   GLUCOSE 97 10/10/2020   BUN 13 10/10/2020   CREATININE 0.99 10/10/2020   BILITOT 0.3 10/10/2020   ALKPHOS 92 10/10/2020   AST 17 10/10/2020   ALT 15 10/10/2020   PROT 6.9  10/10/2020   ALBUMIN 4.3 10/10/2020   CALCIUM 9.4 10/10/2020   ANIONGAP 11 12/10/2017   GFR 48.78 (L) 02/17/2019   Lab Results  Component Value Date   CHOL 239 (H) 10/10/2020   Lab Results  Component Value Date   HDL 62 10/10/2020   Lab Results  Component Value Date   LDLCALC 152 (H) 10/10/2020   Lab Results  Component Value Date   TRIG 140 10/10/2020   Lab Results  Component Value Date   CHOLHDL 3.9 10/10/2020   No results found for: HGBA1C    Assessment & Plan:   Physical exam.  Patient has multiple chronic problems as above and is followed closely by pulmonary and cardiology.  Recent inconsistent use of pravastatin and she is encouraged to get back on that regularly.  We discussed the following health maintenance issues  -Mammogram already scheduled -No indication for further Pap smears -Continue annual flu vaccine -Recommend Shingrix vaccine and she will consider getting at pharmacy if covered by insurance -We discussed lab work.  She had lipids and other chemistries last October and will wait and discuss with cardiology.  Consider hepatitis C antibody with next lab draw -No indication for further colonoscopy   No orders of the defined types were placed in this encounter.   Follow-up: No follow-ups on file.    Carolann Littler, MD

## 2021-06-20 NOTE — Patient Instructions (Signed)
Follow through with mammogram, as scheduled  Consider shingles vaccine later this year- check at pharmacy.    Consider Hep C antibody with next blood drawn.

## 2021-06-29 ENCOUNTER — Encounter: Payer: Self-pay | Admitting: Cardiology

## 2021-06-29 ENCOUNTER — Ambulatory Visit: Payer: PPO | Admitting: Cardiology

## 2021-06-29 ENCOUNTER — Other Ambulatory Visit: Payer: Self-pay

## 2021-06-29 VITALS — BP 110/72 | HR 74 | Ht 67.0 in | Wt 164.8 lb

## 2021-06-29 DIAGNOSIS — R0602 Shortness of breath: Secondary | ICD-10-CM

## 2021-06-29 DIAGNOSIS — I5022 Chronic systolic (congestive) heart failure: Secondary | ICD-10-CM | POA: Diagnosis not present

## 2021-06-29 DIAGNOSIS — I420 Dilated cardiomyopathy: Secondary | ICD-10-CM | POA: Diagnosis not present

## 2021-06-29 DIAGNOSIS — I10 Essential (primary) hypertension: Secondary | ICD-10-CM | POA: Diagnosis not present

## 2021-06-29 DIAGNOSIS — E7800 Pure hypercholesterolemia, unspecified: Secondary | ICD-10-CM | POA: Diagnosis not present

## 2021-06-29 LAB — COMPREHENSIVE METABOLIC PANEL
ALT: 15 IU/L (ref 0–32)
AST: 18 IU/L (ref 0–40)
Albumin/Globulin Ratio: 2.1 (ref 1.2–2.2)
Albumin: 4.6 g/dL (ref 3.7–4.7)
Alkaline Phosphatase: 92 IU/L (ref 44–121)
BUN/Creatinine Ratio: 13 (ref 12–28)
BUN: 12 mg/dL (ref 8–27)
Bilirubin Total: 0.4 mg/dL (ref 0.0–1.2)
CO2: 24 mmol/L (ref 20–29)
Calcium: 9 mg/dL (ref 8.7–10.3)
Chloride: 102 mmol/L (ref 96–106)
Creatinine, Ser: 0.94 mg/dL (ref 0.57–1.00)
Globulin, Total: 2.2 g/dL (ref 1.5–4.5)
Glucose: 99 mg/dL (ref 65–99)
Potassium: 4.3 mmol/L (ref 3.5–5.2)
Sodium: 140 mmol/L (ref 134–144)
Total Protein: 6.8 g/dL (ref 6.0–8.5)
eGFR: 62 mL/min/{1.73_m2} (ref >59)

## 2021-06-29 LAB — LIPID PANEL
Chol/HDL Ratio: 4.6 ratio — ABNORMAL HIGH (ref 0.0–4.4)
Cholesterol, Total: 239 mg/dL — ABNORMAL HIGH (ref 100–199)
HDL: 52 mg/dL (ref >39)
LDL Chol Calc (NIH): 161 mg/dL — ABNORMAL HIGH (ref 0–99)
Triglycerides: 144 mg/dL (ref 0–149)
VLDL Cholesterol Cal: 26 mg/dL (ref 5–40)

## 2021-06-29 MED ORDER — METOPROLOL SUCCINATE ER 50 MG PO TB24: ORAL_TABLET | ORAL | 3 refills | Status: DC

## 2021-06-29 MED ORDER — PRAVASTATIN SODIUM 10 MG PO TABS: 10.0000 mg | ORAL_TABLET | Freq: Every evening | ORAL | 3 refills | Status: DC

## 2021-06-29 MED ORDER — SPIRONOLACTONE 25 MG PO TABS: 12.5000 mg | ORAL_TABLET | Freq: Every day | ORAL | 3 refills | Status: DC

## 2021-06-29 NOTE — Progress Notes (Signed)
Cardiology Office Note    Date:  06/29/2021   ID:  Jessica Mcneil, DOB 1941-12-13, MRN [id number]  PCP:  Eulas Post, MD  Cardiologist: Fransico Him, MD EPS: None  Chief Complaint  Patient presents with   Follow-up    DCM, CHF, HLD, HTN     History of Present Illness:  Jessica Mcneil is a 80 y.o. female with history of NICM with normal coronary arteries, EF 50%  2007 on cath, LVEF 35-40% on echo. . Also has HLD .  Repeat echo for SOB in 2021 showed improved LVEF 50 to 55% with grade 1 DD and RV systolic function was moderately reduced-similar to prior echo. LDL Also high it 151 and Pravachol increased to 20 mg daily.  She has had trouble with higher doses in the past.  She was referred to Pulmonary for her DOE and placed on inhalers and that dx with restrictive lung disease bronchiectasis.    She is here today for followup and is doing well.  She denies any chest pain or pressure, PND, orthopnea, LE edema, dizziness, palpitations or syncope. She is compliant with her meds and is tolerating meds with no SE.   Still has chronic DOE. Can do activity and rest and recovers quickly. Smoked for over 20 yrs but quit 40 yrs ago.   Past Medical History:  Diagnosis Date   Anxiety    Arthritis    CHF (congestive heart failure) ([place])    due to non ischemic cardiomyopathy   Depression    GERD (gastroesophageal reflux disease)    Headache(784.0)    migraines   HLD (hyperlipidemia)    HTN (hypertension)    Overweight(278.02)    Pneumonia    PONV (postoperative nausea and vomiting)    long time ago none recent   Right rotator cuff tear arthropathy 12/09/2017   Shortness of breath     Past Surgical History:  Procedure Laterality Date   ANTERIOR AND POSTERIOR REPAIR  2003   bladder   APPENDECTOMY     BELPHAROPTOSIS REPAIR     bilat   BREAST BIOPSY Right    BREAST LUMPECTOMY WITH NEEDLE LOCALIZATION Right 02/12/2013   Procedure: BREAST LUMPECTOMY WITH NEEDLE  LOCALIZATION;  Surgeon: Merrie Roof, MD;  Location: [place];  Service: General;  Laterality: Right;   CHOLECYSTECTOMY     COLONOSCOPY     EYE SURGERY     Glaucoma Implant   TONSILLECTOMY     TOTAL SHOULDER ARTHROPLASTY Right 12/09/2017   Procedure: REVERSE TOTAL SHOULDER ARTHROPLASTY;  Surgeon: Marchia Bond, MD;  Location: [place];  Service: Orthopedics;  Laterality: Right;   VAGINAL HYSTERECTOMY  1998    Current Medications: Current Meds  Medication Sig   acetaminophen (TYLENOL) 500 MG tablet Take 500 mg by mouth. Patient reports taking 1 to 2 tablets a day if needed.   albuterol (VENTOLIN HFA) 108 (90 Base) MCG/ACT inhaler Inhale 2 puffs into the lungs every 6 (six) hours as needed for wheezing or shortness of breath.   allopurinol (ZYLOPRIM) 300 MG tablet TAKE 1 TABLET BY MOUTH EVERY DAY   ALPRAZolam (XANAX) 0.5 MG tablet TAKE 1 TABLET BY MOUTH AT  BEDTIME AS NEEDED FOR  SLEEP/ANXIETY   aspirin EC 81 MG tablet Take 81 mg by mouth daily.   CALCIUM-MAGNESIUM-ZINC PO Take 1 tablet by mouth daily.   docusate sodium (COLACE) 100 MG capsule Take 200 mg by mouth at bedtime.   Fluticasone-Umeclidin-Vilant (TRELEGY  ELLIPTA) 100-62.5-25 MCG/INH AEPB Inhale 1 puff into the lungs daily.   HYDROcodone-acetaminophen (NORCO) 10-325 MG tablet Take 1-2 tablets by mouth every 6 (six) hours as needed.   latanoprost (XALATAN) 0.005 % ophthalmic solution Place 1 drop into the right eye at bedtime.   metoprolol succinate (TOPROL-XL) 50 MG 24 hr tablet Take with or immediately following a meal.   Multiple Vitamin (MULTIVITAMIN WITH MINERALS) TABS Take 1 tablet by mouth daily.   Omega-3 Fatty Acids 1200 MG CAPS Take 1,200 mg by mouth daily.   omeprazole (PRILOSEC) 40 MG capsule Take 1 capsule (40 mg total) by mouth daily.   Probiotic Product (PROBIOTIC ADVANCED PO) Take by mouth daily.   sertraline (ZOLOFT) 50 MG tablet TAKE ONE HALF TABLET BY MOUTH ONCE DAILY FOR 2 WEEKS AND THEN  INCREASE TO ONE TABLET BY MOUTH DAILY.   spironolactone (ALDACTONE) 25 MG tablet Take 0.5 tablets (12.5 mg total) by mouth daily.   [DISCONTINUED] pravastatin (PRAVACHOL) 10 MG tablet Take 1 tablet (10 mg total) by mouth every evening.     Allergies:   Lipitor [atorvastatin], Codeine, Erythromycin, and Sulfamethoxazole   Social History   Socioeconomic History   Marital status: Divorced    Spouse name: Not on file   Number of children: Not on file   Years of education: Not on file   Highest education level: Not on file  Occupational History   Occupation: retired  Tobacco Use   Smoking status: Former    Packs/day: 2.00    Pack years: 0.00    Types: Cigarettes    Quit date: 12/23/1982    Years since quitting: 38.5   Smokeless tobacco: Never  Vaping Use   Vaping Use: Never used  Substance and Sexual Activity   Alcohol use: No   Drug use: No   Sexual activity: Not on file  Other Topics Concern   Not on file  Social History Narrative   Not on file   Social Determinants of Health   Financial Resource Strain: Low Risk    Difficulty of Paying Living Expenses: Not hard at all  Food Insecurity: No Food Insecurity   Worried About Charity fundraiser in the Last Year: Never true   [place] in the Last Year: Never true  Transportation Needs: No Transportation Needs   Lack of Transportation (Medical): No   Lack of Transportation (Non-Medical): No  Physical Activity: Insufficiently Active   Days of Exercise per Week: 7 days   Minutes of Exercise per Session: 20 min  Stress: No Stress Concern Present   Feeling of Stress : Only a little  Social Connections: Socially Isolated   Frequency of Communication with Friends and Family: Twice a week   Frequency of Social Gatherings with Friends and Family: Three times a week   Attends Religious Services: Never   Active Member of Clubs or Organizations: No   Attends Archivist Meetings: Never   Marital Status: Divorced      Family History:  The patient's family history includes Arthritis in an other family member; Heart disease in her father; Hyperlipidemia in an other family member.   ROS:   Please see the history of present illness.    ROS All other systems reviewed and are negative.   PHYSICAL EXAM:   VS:  BP 110/72   Pulse 74   Ht 5\' 7"  (1.702 m)   Wt 164 lb 12.8 oz (74.8 kg)   SpO2 93%   BMI  25.81 kg/m   Physical Exam  GEN: Well nourished, well developed in no acute distress HEENT: Normal NECK: No JVD; No carotid bruits LYMPHATICS: No lymphadenopathy CARDIAC:RRR, no murmurs, rubs, gallops RESPIRATORY:  Clear to auscultation without rales, wheezing or rhonchi  ABDOMEN: Soft, non-tender, non-distended MUSCULOSKELETAL:  No edema; No deformity  SKIN: Warm and dry NEUROLOGIC:  Alert and oriented x 3 PSYCHIATRIC:  Normal affect   Wt Readings from Last 3 Encounters:  06/29/21 164 lb 12.8 oz (74.8 kg)  06/20/21 167 lb 8 oz (76 kg)  05/09/21 168 lb (76.2 kg)      Studies/Labs Reviewed:   EKG:  EKG is not ordered today. Recent Labs: 10/10/2020: ALT 15; BUN 13; Creatinine, Ser 0.99; Hemoglobin 14.2; Platelets 324; Potassium 4.7; Sodium 139; TSH 4.360   Lipid Panel    Component Value Date/Time   CHOL 239 (H) 10/10/2020 0830   TRIG 140 10/10/2020 0830   HDL 62 10/10/2020 0830   CHOLHDL 3.9 10/10/2020 0830   CHOLHDL 5 02/17/2019 1443   VLDL 34.2 02/17/2019 1443   LDLCALC 152 (H) 10/10/2020 0830   LDLDIRECT 216.7 06/05/2011 1105    Additional studies/ records that were reviewed today include:  Echo 10/30/20 IMPRESSIONS     1. Left ventricular ejection fraction, by estimation, is 50 to 55%. The  left ventricle has low normal function. The left ventricle has no regional  wall motion abnormalities. Left ventricular diastolic parameters are  consistent with Grade I diastolic  dysfunction (impaired relaxation). The average left ventricular global  longitudinal strain is -20.4 %.   2.  Right ventricular systolic function is moderately reduced. The right  ventricular size is normal. There is normal pulmonary artery systolic  pressure. The estimated right ventricular systolic pressure is 85.4 mmHg.   3. The mitral valve is normal in structure. Trivial mitral valve  regurgitation. No evidence of mitral stenosis.   4. The aortic valve is tricuspid. Aortic valve regurgitation is mild.  Mild aortic valve sclerosis is present, with no evidence of aortic valve  stenosis.   5. The inferior vena cava is normal in size with greater than 50%  respiratory variability, suggesting right atrial pressure of 3 mmHg.      ASSESSMENT:    1. Dilated cardiomyopathy ([place])   2. Chronic systolic heart failure ([place])   3. Primary hypertension   4. Pure hypercholesterolemia   5. Shortness of breath      PLAN:  In order of problems listed above:  Nonischemic cardiomyopathy  -EF 45 to 50% 09/29/2019.   -Follow-up echo 10/30/2020 LVEF 50 to 55% with grade 1 DD, and RV systolic function moderately reduced similar to prior echo.  Chronic systolic CHF  -she appears euvolemic on exam today -continue BB and spiro  Essential hypertension  -BP controlled on exam today -Continue prescription drug management with Spironolactone 12.5mg  daily and Toprol XL 50mg  daily>refilled for 1 year  -check BMET today  HLD  -LDL goal < 100 -check ALT and FLP today -Continue prescription drug management with Pravachol 10mg  daily  >>refilled for 1 year -She has been unable to tolerate higher doses in the past but so far tolerating well.  Shortness of Breath -this is chronic and related to bronchiectasis and restrictive lung disease -followed in Pulmonary clinic  Medication Adjustments/Labs and Tests Ordered: Current medicines are reviewed at length with the patient today.  Concerns regarding medicines are outlined above.  Medication changes, Labs and Tests ordered today are listed in the Patient  Instructions below. There are no Patient Instructions on file for this visit.   Signed, Fransico Him, MD  06/29/2021 8:16 AM    [facility] [address], [place], [place]  [address] Phone: [phone]; Fax: [phone]

## 2021-06-29 NOTE — Patient Instructions (Addendum)
Medication Instructions:  Your physician recommends that you continue on your current medications as directed. Please refer to the Current Medication list given to you today.  *If you need a refill on your cardiac medications before your next appointment, please call your pharmacy*   Lab Work: TODAY: FLP, CMET If you have labs (blood work) drawn today and your tests are completely normal, you will receive your results only by: [place] (if you have MyChart) OR A paper copy in the mail If you have any lab test that is abnormal or we need to change your treatment, we will call you to review the results.   Follow-Up: At [facility], you and your health needs are our priority.  As part of our continuing mission to provide you with exceptional heart care, we have created designated Provider Care Teams.  These Care Teams include your primary Cardiologist (physician) and Advanced Practice Providers (APPs -  Physician Assistants and Nurse Practitioners) who all work together to provide you with the care you need, when you need it.  Your next appointment:   6 month(s)  The format for your next appointment:   In Person  Provider:   You will see one of the following Advanced Practice Providers on your designated Care Team:   Ermalinda Barrios, PA-C  Then, Fransico Him, MD will plan to see you again in 1 year(s).

## 2021-06-29 NOTE — Addendum Note (Signed)
Addended by: Antonieta Iba on: 06/29/2021 08:31 AM   Modules accepted: Orders

## 2021-07-05 ENCOUNTER — Telehealth: Payer: Self-pay | Admitting: Pharmacist

## 2021-07-05 NOTE — Chronic Care Management (AMB) (Signed)
    Chronic Care Management Pharmacy Assistant   Name: Gayatri Teasdale  MRN: [id number] DOB: September 20, 1941  07/05/21- Called patient to remind of appointment with Jeni Salles) on (07/06/21 at 10am via telephone.)  Patient aware of appointment date, time, and type of appointment (either telephone or in person). Patient aware to have/bring all medications, supplements, blood pressure and/or blood sugar logs to visit.  Questions: Have you had any recent office visit or specialist visit outside of [place]? Yes. Ophthalmologist. Are there any concerns you would like to discuss during your office visit? No concerns at this time.  Are you having any problems obtaining your medications? (Whether it pharmacy issues or cost) No issues at this time.  If patient has any PAP medications ask if they are having any problems getting their PAP medication or refill? No Patient Assistance at this time.   Care Gaps:  Hepatitis C screening - never done. Zoster vaccines [facility]) - never done. COVID-19 - booster 4 overdue since 01/21/21.  Star Rating Drug:  Pravastatin 10mg  - last filled on 06/29/21 90DS at [facility]  Any gaps in medications fill history? No.  [place]  Clinical Pharmacist Assistant [phone]

## 2021-07-06 ENCOUNTER — Ambulatory Visit (INDEPENDENT_AMBULATORY_CARE_PROVIDER_SITE_OTHER): Payer: PPO | Admitting: Pharmacist

## 2021-07-06 ENCOUNTER — Other Ambulatory Visit: Payer: Self-pay

## 2021-07-06 ENCOUNTER — Other Ambulatory Visit: Payer: Self-pay | Admitting: Family Medicine

## 2021-07-06 DIAGNOSIS — I10 Essential (primary) hypertension: Secondary | ICD-10-CM | POA: Diagnosis not present

## 2021-07-06 DIAGNOSIS — E785 Hyperlipidemia, unspecified: Secondary | ICD-10-CM

## 2021-07-06 MED ORDER — TRELEGY ELLIPTA 100-62.5-25 MCG/INH IN AEPB: 1.0000 | INHALATION_SPRAY | Freq: Every day | RESPIRATORY_TRACT | 6 refills | Status: DC

## 2021-07-06 MED ORDER — ALLOPURINOL 300 MG PO TABS: 300.0000 mg | ORAL_TABLET | Freq: Every day | ORAL | 0 refills | Status: DC

## 2021-07-06 NOTE — Progress Notes (Signed)
Chronic Care Management Pharmacy Note  07/16/2021 Name:  Jessica Mcneil [facility] MRN:  [id number] DOB:  January 05, 1941  Summary: LDL not at goal < 70 Pt reports anxiety is better controlled  Recommendations/Changes made from today's visit: -Recommended for patient to call cardiology to discuss therapy change for cholesterol medication -Recommended taking calcium citrate instead of carbonate for better absorption with chronic PPI therapy  Plan: Follow up in 4 months  Subjective: Jessica Mcneil is an 80 y.o. year old female who is a primary patient of Burchette, Alinda Sierras, MD.  The CCM team was consulted for assistance with disease management and care coordination needs.    Engaged with patient by telephone for follow up visit in response to provider referral for pharmacy case management and/or care coordination services.   Consent to Services:  The patient was given information about Chronic Care Management services, agreed to services, and gave verbal consent prior to initiation of services.  Please see initial visit note for detailed documentation.   Patient Care Team: Eulas Post, MD as PCP - General (Family Medicine) Sueanne Margarita, MD as PCP - Cardiology (Cardiology) Viona Gilmore, [facility] as Pharmacist (Pharmacist)  Recent office visits: 12/27/20 Charlott Rakes, LPN: Patient presented for AWV.  06/20/21 Carolann Littler, MD: Patient presented for annual exam. Recommended shingrix and hep C antibody with next lab work.  Recent consult visits: 06/29/21 Fransico Him, MD (cardiology): Patient presented for HF follow up. Plan to switch pravastatin to rosuvastatin 10 mg daily.  05/09/21 Freda Jackson, MD (pulmonary): Patient presented for ILD follow up. Increased omeprazole to 40 mg daily. Trial of Trelegy to replace Spiriva.  03/08/21 Freda Jackson, MD (pulmonary): Patient presented for abnormal PFTs. Prescribed spiriva 2.5 mcg 2 puffs daily.   01/25/21 Freda Jackson, MD (pulmonary): Patient presented with dyspnea on exertion. PFTs completed and showed moderate restrictive process.  01/03/21 Freda Jackson, MD (pulmonary): Patient presented for dyspnea on exertion consult. Prescribed albuterol PRN.  Hospital visits: None in previous 6 months  Objective:  Lab Results  Component Value Date   CREATININE 0.94 06/29/2021   BUN 12 06/29/2021   GFR 48.78 (L) 02/17/2019   GFRNONAA 55 (L) 10/10/2020   GFRAA 64 10/10/2020   NA 140 06/29/2021   K 4.3 06/29/2021   CALCIUM 9.0 06/29/2021   CO2 24 06/29/2021    Lab Results  Component Value Date/Time   GFR 48.78 (L) 02/17/2019 02:43 PM   GFR 66.91 02/20/2016 08:40 AM    Last diabetic Eye exam: No results found for: HMDIABEYEEXA  Last diabetic Foot exam: No results found for: HMDIABFOOTEX   Lab Results  Component Value Date   CHOL 239 (H) 06/29/2021   HDL 52 06/29/2021   LDLCALC 161 (H) 06/29/2021   LDLDIRECT 216.7 06/05/2011   TRIG 144 06/29/2021   CHOLHDL 4.6 (H) 06/29/2021    Hepatic Function Latest Ref Rng & Units 06/29/2021 10/10/2020 02/17/2019  Total Protein 6.0 - 8.5 g/dL 6.8 6.9 6.5  Albumin 3.7 - 4.7 g/dL 4.6 4.3 4.1  AST 0 - 40 IU/L 18 17 19   ALT 0 - 32 IU/L 15 15 18   Alk Phosphatase 44 - 121 IU/L 92 92 76  Total Bilirubin 0.0 - 1.2 mg/dL 0.4 0.3 0.4  Bilirubin, Direct 0.0 - 0.3 mg/dL - - -    Lab Results  Component Value Date/Time   TSH 4.360 10/10/2020 08:30 AM   TSH 2.48 02/20/2016 08:40 AM    CBC Latest Ref Rng &  Units 10/10/2020 11/24/2019 02/17/2019  WBC 3.4 - 10.8 x10E3/uL 8.5 7.3 7.8  Hemoglobin 11.1 - 15.9 g/dL 14.2 13.2 13.8  Hematocrit 34.0 - 46.6 % 43.0 41 41.7  Platelets 150 - 450 x10E3/uL 324 357 305.0    No results found for: VD25OH  Clinical ASCVD: No  The 10-year ASCVD risk score Mikey Bussing DC Jr., et al., 2013) is: 21.5%   Values used to calculate the score:     Age: 80 years     Sex: Female     Is Non-Hispanic African American: No     Diabetic:  No     Tobacco smoker: No     Systolic Blood Pressure: 428 mmHg     Is BP treated: Yes     HDL Cholesterol: 52 mg/dL     Total Cholesterol: 239 mg/dL    Depression screen [facility] 2/9 12/27/2020 09/03/2019 06/16/2015  Decreased Interest 1 2 0  Down, Depressed, Hopeless 0 2 0  PHQ - 2 Score 1 4 0  Altered sleeping - 2 -  Tired, decreased energy - 3 -  Change in appetite - 2 -  Feeling bad or failure about yourself  - 1 -  Trouble concentrating - 1 -  Moving slowly or fidgety/restless - 1 -  Suicidal thoughts - 0 -  PHQ-9 Score - 14 -  Difficult doing work/chores - Not difficult at all -     Social History   Tobacco Use  Smoking Status Former   Packs/day: 2.00   Types: Cigarettes   Quit date: 12/23/1982   Years since quitting: 38.5  Smokeless Tobacco Never   BP Readings from Last 3 Encounters:  06/29/21 110/72  06/20/21 124/64  05/09/21 106/70   Pulse Readings from Last 3 Encounters:  06/29/21 74  06/20/21 75  05/09/21 74   Wt Readings from Last 3 Encounters:  06/29/21 164 lb 12.8 oz (74.8 kg)  06/20/21 167 lb 8 oz (76 kg)  05/09/21 168 lb (76.2 kg)    Assessment/Interventions: Review of patient past medical history, allergies, medications, health status, including review of consultants reports, laboratory and other test data, was performed as part of comprehensive evaluation and provision of chronic care management services.   SDOH:  (Social Determinants of Health) assessments and interventions performed: No   CCM Care Plan  Allergies  Allergen Reactions   Lipitor [Atorvastatin]     UNSPECIFIED REACTION    Codeine Itching   Erythromycin Nausea And Vomiting    GI UPSET   Sulfamethoxazole Itching    Over 40 years ago    Medications Reviewed Today     Reviewed by Nickolas Madrid, CMA (Certified Medical Assistant) on 06/29/21 at [place] List Status: <None>   Medication Order Taking? Sig Documenting Provider Last Dose Status Informant  acetaminophen (TYLENOL)  500 MG tablet [id number] Yes Take 500 mg by mouth. Patient reports taking 1 to 2 tablets a day if needed. [provider] Taking Active Self  albuterol (VENTOLIN HFA) 108 (90 Base) MCG/ACT inhaler [id number] Yes Inhale 2 puffs into the lungs every 6 (six) hours as needed for wheezing or shortness of breath. [name], MD Taking Active   allopurinol (ZYLOPRIM) 300 MG tablet [id number] Yes TAKE 1 TABLET BY MOUTH EVERY DAY Burchette, Alinda Sierras, MD Taking Active   ALPRAZolam Duanne Moron) 0.5 MG tablet [id number] Yes TAKE 1 TABLET BY MOUTH AT  BEDTIME AS NEEDED FOR  SLEEP/ANXIETY Burchette, Alinda Sierras, MD Taking Active   aspirin EC  81 MG tablet [id number] Yes Take 81 mg by mouth daily. [provider] Taking Active Self  CALCIUM-MAGNESIUM-ZINC PO [id number] Yes Take 1 tablet by mouth daily. [provider] Taking Active Self  docusate sodium (COLACE) 100 MG capsule [id number] Yes Take 200 mg by mouth at bedtime. [provider] Taking Active Self  Fluticasone-Umeclidin-Vilant (TRELEGY ELLIPTA) 100-62.5-25 MCG/INH AEPB [id number] Yes Inhale 1 puff into the lungs daily. [name], MD Taking Active   HYDROcodone-acetaminophen [facility]) 10-325 MG tablet [id number] Yes Take 1-2 tablets by mouth every 6 (six) hours as needed. Marchia Bond, MD Taking Active   latanoprost (XALATAN) 0.005 % ophthalmic solution [id number] Yes Place 1 drop into the right eye at bedtime. [provider] Taking Active   metoprolol succinate (TOPROL-XL) 50 MG 24 hr tablet [id number] Yes Take with or immediately following a meal. Imogene Burn, PA-C Taking Active   Multiple Vitamin (MULTIVITAMIN WITH MINERALS) TABS [id number] Yes Take 1 tablet by mouth daily. [provider] Taking Active Self  Omega-3 Fatty Acids 1200 MG CAPS [id number] Yes Take 1,200 mg by mouth daily. [provider] Taking Active Self  omeprazole (PRILOSEC) 40 MG capsule [id number] Yes Take 1 capsule (40 mg  total) by mouth daily. [name], MD Taking Active   pravastatin (PRAVACHOL) 10 MG tablet [id number] Yes Take 1 tablet (10 mg total) by mouth every evening. Imogene Burn, PA-C Taking Active   Probiotic Product (PROBIOTIC ADVANCED PO) [id number] Yes Take by mouth daily. [provider] Taking Active Self  sertraline (ZOLOFT) 50 MG tablet [id number] Yes TAKE ONE HALF TABLET BY MOUTH ONCE DAILY FOR 2 WEEKS AND THEN INCREASE TO ONE TABLET BY MOUTH DAILY. Eulas Post, MD Taking Active   spironolactone (ALDACTONE) 25 MG tablet [id number] Yes Take 0.5 tablets (12.5 mg total) by mouth daily. 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   Irritable bowel syndrome 05/09/2021   Right rotator cuff tear arthropathy 12/09/2017   Primary localized osteoarthrosis of shoulder 12/09/2017   Chronic right shoulder pain 05/29/2017   Osteoarthritis of hands, bilateral 05/29/2017   Primary osteoarthritis of both feet 05/29/2017   Gout 02/13/2016   Migraine headache with aura 05/08/2015   Primary open angle glaucoma of right eye, mild stage 09/38/1829   Chronic systolic heart failure ([place]) 07/18/2014   Depression 11/23/2013   GERD (gastroesophageal reflux disease) 11/23/2013   Hypertension 11/23/2013   Dilated cardiomyopathy ([place]) 11/23/2013   Capsular glaucoma with pseudoexfoliation of lens, left eye, 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/04/2013   HTN (hypertension) 08/03/2013   Abnormal mammogram with microcalcification 01/25/2013   Dyspnea 03/14/2011   HEADACHE 06/21/2010   ACUTE CHRONIC COMB SYSTOLIC&DIASTOLIC HEART FAIL 93/71/6967   History of depression 08/04/2009   Insomnia 08/04/2009   CONSTIPATION 06/22/2009   Hyperlipidemia 02/23/2009   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   Nonischemic cardiomyopathy ([place]) 02/23/2009    Immunization History  Administered Date(s) Administered   Fluad  Quad(high Dose 65+) 09/03/2019   Influenza, High Dose Seasonal PF 10/11/2014, 10/07/2017, 02/17/2019   Influenza-Unspecified 09/04/2016, 10/07/2017, 09/27/2020   PFIZER(Purple Top)SARS-COV-2 Vaccination 01/30/2020, 02/24/2020, 09/21/2020   Pneumococcal Conjugate-13 02/13/2016, 10/07/2017   Pneumococcal Polysaccharide-23 12/23/2006   Td 12/23/2004   Tdap 02/13/2016    Conditions to be addressed/monitored:  Hypertension, Hyperlipidemia, Heart Failure, GERD, Depression, Anxiety, Gout, and migraines, constipation  Conditions addressed this visit:  Anxiety, Hyperlipidemia, ILD  Care Plan : CCM Pharmacy Care Plan  Updates made by Viona Gilmore, [place] since 07/16/2021 12:00 AM     Problem: Problem: Hypertension, Hyperlipidemia, Heart Failure, Anxiety, GERD, Gout and migraines, constipation      Long-Range Goal: Patient-Specific Goal   Start Date: 03/02/2021  Expected End Date: 03/02/2022  Recent Progress: On track  Priority: High  Note:   Current Barriers:  Unable to independently monitor therapeutic efficacy Unable to achieve control of cholesterol   Pharmacist Clinical Goal(s):  Patient will achieve adherence to monitoring guidelines and medication adherence to achieve therapeutic efficacy achieve control of cholesterol as evidenced by next lipid panel  through collaboration with PharmD and provider.   Interventions: 1:1 collaboration with Eulas Post, MD regarding development and update of comprehensive plan of care as evidenced by provider attestation and co-signature Inter-disciplinary care team collaboration (see longitudinal plan of care) Comprehensive medication review performed; medication list updated in electronic medical record  Hypertension (BP goal <140/90) -Controlled -Current treatment: metoprolol succinate 64m, 1 tablet once daily with or immediately following a meal  spironolactone 235m 0.5 tablet daily -Medications previously tried:  lisinopril,  losartan   -Current home readings: 110-120s/70-80s (never > 120) -Current dietary habits: did not discuss -Current exercise habits: did not discuss -Reports hypotensive/hypertensive symptoms -Educated on Importance of home blood pressure monitoring; -Counseled to monitor BP at home at least weekly, document, and provide log at future appointments -Counseled on diet and exercise extensively Recommended to continue current medication  Hyperlipidemia: (LDL goal < 70) -Uncontrolled -Current treatment: pravastatin 2051m tablet daily omega-3 fatty acids 1200m75m capsule daily -Medications previously tried: atorvastatin  -Current dietary patterns: doesn't cook with oil, not eating as many fried foods; eats some ice cream; eats fruit every day -Current exercise habits: goes on short walks with her dog every day -Educated on Cholesterol goals;  Importance of limiting foods high in cholesterol; Exercise goal of 150 minutes per week; -Recommended to continue current medication Recommended reaching out to cardiology as they have been trying to get in touch with her to change medication.  Heart Failure (Goal: manage symptoms and prevent exacerbations) -Controlled -Last ejection fraction: 45-50% (Date: n/a) -HF type: Combined Systolic and Diastolic -NYHA Class: II (slight limitation of activity) -AHA HF Stage: C (Heart disease and symptoms present) -Current treatment: metoprolol succinate 50mg63mtablet once daily spironolactone 25mg,83m tablet daily HCTZ 12.5mg, 111mpsule once daily as needed for swelling (patient stated she has not taken in a long time; unable to provide timeframe) -Medications previously tried:  lisinopril, losartan  -Current home BP/HR readings: BP usually 110-120s -Current dietary habits: did not discuss -Current exercise habits: goes on short walks with her dog daily -Educated on Importance of weighing daily; if you gain more than 3 pounds in one day or 5 pounds in  one week, call provider Proper diuretic administration and potassium supplementation -Recommended to continue current medication  ILD (Goal: control symptoms and prevent exacerbations) -Controlled -Current treatment  Breztri 2 puffs twice daily Albuterol HFA as needed -Medications previously tried: Spiriva  -Gold Grade: Gold 1 (FEV1>80%) -Current COPD Classification:  B (high sx, <2 exacerbations/yr) -MMRC/CAT score: 1 -Pulmonary function testing: 01/25/21 -Exacerbations requiring treatment in last 6 months: none -Patient reports consistent use of maintenance inhaler -Frequency of rescue inhaler use: a few times a week -Counseled on Proper inhaler technique; Benefits of consistent maintenance inhaler use When to use rescue inhaler -Recommended to continue current medication Assessed  patient finances. Patient is ok wit paying $45 per month for Breztri.   Anxiety (Goal: minimize symptoms) -Uncontrolled -Current treatment: Sertraline 50 mg 1 tablet daily alprazolam 0.20m, 1 tablet at bedtime as needed for sleep/anxiety  -Medications previously tried/failed: none -PHQ9: 1 -GAD7: 3 -Educated on Benefits of medication for symptom control Benefits of cognitive-behavioral therapy with or without medication -Recommended to continue current medication  Osteoporosis (Goal improve bone density and prevent fractures) -Uncontrolled -Last DEXA Scan: 09/08/19   T-Score femoral neck: -1.9, -1.8  T-Score total hip: n/a  T-Score lumbar spine: 0.1  T-Score forearm radius: n/a  10-year probability of major osteoporotic fracture: 20.6%  10-year probability of hip fracture: 5.1% -Patient is a candidate for pharmacologic treatment due to T-Score -1.0 to -2.5 and 10-year risk of major osteoporotic fracture > 20% and T-Score -1.0 to -2.5 and 10-year risk of hip fracture > 3% -Current treatment  Calcium-mag-zinc with D3 (D3 200 units, 333 mg of calcium) 1 at night  Multivitamin daily (800 units of  vitamin D) -Medications previously tried: alendronate (GI side effects) -Recommend [phone] units of vitamin D daily. Recommend 1200 mg of calcium daily from dietary and supplemental sources. Recommend weight-bearing and muscle strengthening exercises for building and maintaining bone density. -Recommended switching to calcium citrate with chronic PPI therapy Recommend repeat DEXA.  Gout (Goal: uric acid < 6 and prevent flare ups) -Controlled -Current treatment  allopurinol 3017m 1 tablet daily colchicine 0.67m63m1 tablet daily as needed -Medications previously tried: none  -Counseled on eating a low-purine diet. Avoid foods and drinks such as: liver, kidney, anchovies, asparagus, herring, mushrooms, mussels, beer,etc.  Migraines (Goal: limit severity and frequency of migraines) -Controlled -Current treatment  hydrocodone/ APAP 10/325m37m to 2 tablets every six hours as needed (for bad headaches- not taken for >6 months ) Tramadol 50 mg every 6 hours for pain (came from orthopedic surgeon)  Tylenol 500mg11mkes 1 to 2 tablets a day if needed. Patient notes does not very often -Medications previously tried: n/a  -Recommended to continue current medication  GERD/hiatal hernia (Goal: minimize symptoms of acid reflux/heartburn) -Controlled -Current treatment  Omeprazole 20mg,81mapsule daily  -Medications previously tried: none  -Recommended to continue current medication Counseled on non-pharmacologic management of symptoms such as elevating the head of your bed, avoiding eating 2-3 hours before bed, avoiding triggering foods such as acidic, spicy, or fatty foods, eating smaller meals, and wearing clothes that are loose around the waist   Health Maintenance -Vaccine gaps: pneumovax -Current therapy:  Probiotic 1 capsule once daily Aspirin 81 mg 1 tablet daily Multivitamin 1 tablet daily (100 mg calcium, vitamin D 1000 units) -Educated on Cost vs benefit of each product must be  carefully weighed by individual consumer -Patient is satisfied with current therapy and denies issues -Recommended to continue current medication  Patient Goals/Self-Care Activities Patient will:  - take medications as prescribed target a minimum of 150 minutes of moderate intensity exercise weekly  Follow Up Plan: Telephone follow up appointment with care management team member scheduled for: 4 months      Medication Assistance: None required.  Patient affirms current coverage meets needs.  Compliance/Adherence/Medication fill history: Care Gaps: Shingrix, COVID booster, Hep C screening  Star-Rating Drugs: Pravastatin 10mg -53mt [facility] 06/29/21 [facility]  Patient's preferred pharmacy is:  [id number] [id number], [place] [phone] box? Yes Pt endorses 100%  compliance  We discussed: Current pharmacy is preferred with insurance plan and patient is satisfied with pharmacy services Patient decided to: Continue current medication management strategy  Care Plan and Follow Up Patient Decision:  Patient agrees to Care Plan and Follow-up.  Plan: Telephone follow up appointment with care management team member scheduled for:  4 months    Jeni Salles, PharmD [place] Pharmacist [place] at [place] [phone]

## 2021-07-16 NOTE — Patient Instructions (Signed)
Hi [place],  It was great to catch up again! Don't forget to switch over to calcium citrate as we discussed for better absorption.   Please reach out to me if you have any questions or need anything before our follow up!  Best, Maddie  Jeni Salles, PharmD, [facility]   Visit Information   Goals Addressed   None    Patient Care Plan: CCM Pharmacy Care Plan     Problem Identified: Problem: Hypertension, Hyperlipidemia, Heart Failure, Anxiety, GERD, Gout and migraines, constipation      Long-Range Goal: Patient-Specific Goal   Start Date: 03/02/2021  Expected End Date: 03/02/2022  Recent Progress: On track  Priority: High  Note:   Current Barriers:  Unable to independently monitor therapeutic efficacy Unable to achieve control of cholesterol   Pharmacist Clinical Goal(s):  Patient will achieve adherence to monitoring guidelines and medication adherence to achieve therapeutic efficacy achieve control of cholesterol as evidenced by next lipid panel  through collaboration with PharmD and provider.   Interventions: 1:1 collaboration with Eulas Post, MD regarding development and update of comprehensive plan of care as evidenced by provider attestation and co-signature Inter-disciplinary care team collaboration (see longitudinal plan of care) Comprehensive medication review performed; medication list updated in electronic medical record  Hypertension (BP goal <140/90) -Controlled -Current treatment: metoprolol succinate '50mg'$ , 1 tablet once daily with or immediately following a meal  spironolactone '25mg'$ , 0.5 tablet daily -Medications previously tried:  lisinopril, losartan   -Current home readings: 110-120s/70-80s (never > 120) -Current dietary habits: did not discuss -Current exercise habits: did not discuss -Reports hypotensive/hypertensive symptoms -Educated on Importance of home blood pressure  monitoring; -Counseled to monitor BP at home at least weekly, document, and provide log at future appointments -Counseled on diet and exercise extensively Recommended to continue current medication  Hyperlipidemia: (LDL goal < 70) -Uncontrolled -Current treatment: pravastatin '20mg'$  1 tablet daily omega-3 fatty acids '1200mg'$ , 1 capsule daily -Medications previously tried: atorvastatin  -Current dietary patterns: doesn't cook with oil, not eating as many fried foods; eats some ice cream; eats fruit every day -Current exercise habits: goes on short walks with her dog every day -Educated on Cholesterol goals;  Importance of limiting foods high in cholesterol; Exercise goal of 150 minutes per week; -Recommended to continue current medication Recommended reaching out to cardiology as they have been trying to get in touch with her to change medication.  Heart Failure (Goal: manage symptoms and prevent exacerbations) -Controlled -Last ejection fraction: 45-50% (Date: n/a) -HF type: Combined Systolic and Diastolic -NYHA Class: II (slight limitation of activity) -AHA HF Stage: C (Heart disease and symptoms present) -Current treatment: metoprolol succinate '50mg'$ , 1 tablet once daily spironolactone '25mg'$ , 0.5 tablet daily HCTZ 12.'5mg'$ , 1 capsule once daily as needed for swelling (patient stated she has not taken in a long time; unable to provide timeframe) -Medications previously tried:  lisinopril, losartan  -Current home BP/HR readings: BP usually 110-120s -Current dietary habits: did not discuss -Current exercise habits: goes on short walks with her dog daily -Educated on Importance of weighing daily; if you gain more than 3 pounds in one day or 5 pounds in one week, call provider Proper diuretic administration and potassium supplementation -Recommended to continue current medication  ILD (Goal: control symptoms and prevent exacerbations) -Controlled -Current treatment  Breztri 2 puffs  twice daily Albuterol HFA as needed -Medications previously tried: Spiriva  -Gold Grade: Gold 1 (FEV1>80%) -Current COPD Classification:  B (high  sx, <2 exacerbations/yr) -MMRC/CAT score: 1 -Pulmonary function testing: 01/25/21 -Exacerbations requiring treatment in last 6 months: none -Patient reports consistent use of maintenance inhaler -Frequency of rescue inhaler use: a few times a week -Counseled on Proper inhaler technique; Benefits of consistent maintenance inhaler use When to use rescue inhaler -Recommended to continue current medication Assessed patient finances. Patient is ok wit paying $45 per month for Breztri.   Anxiety (Goal: minimize symptoms) -Uncontrolled -Current treatment: Sertraline 50 mg 1 tablet daily alprazolam 0.'5mg'$ , 1 tablet at bedtime as needed for sleep/anxiety  -Medications previously tried/failed: none -PHQ9: 1 -GAD7: 3 -Educated on Benefits of medication for symptom control Benefits of cognitive-behavioral therapy with or without medication -Recommended to continue current medication  Osteoporosis (Goal improve bone density and prevent fractures) -Uncontrolled -Last DEXA Scan: 09/08/19   T-Score femoral neck: -1.9, -1.8  T-Score total hip: n/a  T-Score lumbar spine: 0.1  T-Score forearm radius: n/a  10-year probability of major osteoporotic fracture: 20.6%  10-year probability of hip fracture: 5.1% -Patient is a candidate for pharmacologic treatment due to T-Score -1.0 to -2.5 and 10-year risk of major osteoporotic fracture > 20% and T-Score -1.0 to -2.5 and 10-year risk of hip fracture > 3% -Current treatment  Calcium-mag-zinc with D3 (D3 200 units, 333 mg of calcium) 1 at night  Multivitamin daily (800 units of vitamin D) -Medications previously tried: alendronate (GI side effects) -Recommend [phone] units of vitamin D daily. Recommend 1200 mg of calcium daily from dietary and supplemental sources. Recommend weight-bearing and muscle  strengthening exercises for building and maintaining bone density. -Recommended switching to calcium citrate with chronic PPI therapy Recommend repeat DEXA.  Gout (Goal: uric acid < 6 and prevent flare ups) -Controlled -Current treatment  allopurinol '300mg'$ , 1 tablet daily colchicine 0.'6mg'$ , 1 tablet daily as needed -Medications previously tried: none  -Counseled on eating a low-purine diet. Avoid foods and drinks such as: liver, kidney, anchovies, asparagus, herring, mushrooms, mussels, beer,etc.  Migraines (Goal: limit severity and frequency of migraines) -Controlled -Current treatment  hydrocodone/ APAP 10/'325mg'$ , 1 to 2 tablets every six hours as needed (for bad headaches- not taken for >6 months ) Tramadol 50 mg every 6 hours for pain (came from orthopedic surgeon)  Tylenol '500mg'$ , takes 1 to 2 tablets a day if needed. Patient notes does not very often -Medications previously tried: n/a  -Recommended to continue current medication  GERD/hiatal hernia (Goal: minimize symptoms of acid reflux/heartburn) -Controlled -Current treatment  Omeprazole '20mg'$ , 1 capsule daily  -Medications previously tried: none  -Recommended to continue current medication Counseled on non-pharmacologic management of symptoms such as elevating the head of your bed, avoiding eating 2-3 hours before bed, avoiding triggering foods such as acidic, spicy, or fatty foods, eating smaller meals, and wearing clothes that are loose around the waist   Health Maintenance -Vaccine gaps: pneumovax -Current therapy:  Probiotic 1 capsule once daily Aspirin 81 mg 1 tablet daily Multivitamin 1 tablet daily (100 mg calcium, vitamin D 1000 units) -Educated on Cost vs benefit of each product must be carefully weighed by individual consumer -Patient is satisfied with current therapy and denies issues -Recommended to continue current medication  Patient Goals/Self-Care Activities Patient will:  - take medications as  prescribed target a minimum of 150 minutes of moderate intensity exercise weekly  Follow Up Plan: Telephone follow up appointment with care management team member scheduled for: 4 months       Patient verbalizes understanding of instructions provided today and agrees to view  in [place].  Telephone follow up appointment with pharmacy team member scheduled for:4 months  Viona Gilmore, [facility]

## 2021-07-18 ENCOUNTER — Other Ambulatory Visit: Payer: Self-pay

## 2021-07-18 ENCOUNTER — Ambulatory Visit
Admission: RE | Admit: 2021-07-18 | Discharge: 2021-07-18 | Disposition: A | Discharge status: Home / Self Care | Payer: PPO | Source: Ambulatory Visit | Attending: Family Medicine | Admitting: Family Medicine

## 2021-07-18 DIAGNOSIS — Z1231 Encounter for screening mammogram for malignant neoplasm of breast: Secondary | ICD-10-CM | POA: Diagnosis not present

## 2021-08-24 ENCOUNTER — Telehealth: Payer: Self-pay | Admitting: Pharmacist

## 2021-08-24 NOTE — Progress Notes (Signed)
Chronic Care Management Pharmacy Assistant   Name: Jessica Mcneil  MRN: [id number] DOB: 1941-09-11  Reason for Encounter: Disease State/ Hyperlipidemia Assessment Call   Conditions to be addressed/monitored: HLD  Recent office visits:  None  Recent consult visits:  07-18-2021 [facility] - Patient presented for [facility] visits:  None in previous 6 months  Medications: Outpatient Encounter Medications as of 08/24/2021  Medication Sig   acetaminophen (TYLENOL) 500 MG tablet Take 500 mg by mouth. Patient reports taking 1 to 2 tablets a day if needed.   albuterol (VENTOLIN HFA) 108 (90 Base) MCG/ACT inhaler Inhale 2 puffs into the lungs every 6 (six) hours as needed for wheezing or shortness of breath.   allopurinol (ZYLOPRIM) 300 MG tablet Take 1 tablet (300 mg total) by mouth daily.   ALPRAZolam (XANAX) 0.5 MG tablet TAKE 1 TABLET BY MOUTH AT  BEDTIME AS NEEDED FOR  SLEEP/ANXIETY   aspirin EC 81 MG tablet Take 81 mg by mouth daily.   CALCIUM-MAGNESIUM-ZINC PO Take 1 tablet by mouth daily.   docusate sodium (COLACE) 100 MG capsule Take 200 mg by mouth at bedtime.   Fluticasone-Umeclidin-Vilant (TRELEGY ELLIPTA) 100-62.5-25 MCG/INH AEPB Inhale 1 puff into the lungs daily.   HYDROcodone-acetaminophen (NORCO) 10-325 MG tablet Take 1-2 tablets by mouth every 6 (six) hours as needed.   latanoprost (XALATAN) 0.005 % ophthalmic solution Place 1 drop into the right eye at bedtime.   metoprolol succinate (TOPROL-XL) 50 MG 24 hr tablet Take with or immediately following a meal.   Multiple Vitamin (MULTIVITAMIN WITH MINERALS) TABS Take 1 tablet by mouth daily.   Omega-3 Fatty Acids 1200 MG CAPS Take 1,200 mg by mouth daily.   omeprazole (PRILOSEC) 40 MG capsule Take 1 capsule (40 mg total) by mouth daily.   pravastatin (PRAVACHOL) 10 MG tablet Take 1 tablet (10 mg total) by mouth every evening.   Probiotic Product (PROBIOTIC ADVANCED PO) Take by mouth daily.    sertraline (ZOLOFT) 50 MG tablet TAKE ONE HALF TABLET BY MOUTH ONCE DAILY FOR 2 WEEKS AND THEN INCREASE TO ONE TABLET BY MOUTH DAILY.   spironolactone (ALDACTONE) 25 MG tablet Take 0.5 tablets (12.5 mg total) by mouth daily.   No facility-administered encounter medications on file as of 08/24/2021.  08/24/2021 Name: Jessica Mcneil [facility] MRN: [id number] DOB: 09/14/1941 [name] is a 80 y.o. year old female who is a primary care patient of Burchette, Alinda Sierras, MD.  Comprehensive medication review performed; Spoke to patient regarding cholesterol  Lipid Panel    Component Value Date/Time   CHOL 239 (H) 06/29/2021 0833   TRIG 144 06/29/2021 0833   HDL 52 06/29/2021 0833   LDLCALC 161 (H) 06/29/2021 0833   LDLDIRECT 216.7 06/05/2011 1105    10-year ASCVD risk score: The 10-year ASCVD risk score Mikey Bussing DC Brooke Bonito., et al., 2013) is: 21.5%   Values used to calculate the score:     Age: 89 years     Sex: Female     Is Non-Hispanic African American: No     Diabetic: No     Tobacco smoker: No     Systolic Blood Pressure: [id number] mmHg     Is BP treated: Yes     HDL Cholesterol: 52 mg/dL     Total Cholesterol: 239 mg/dL  Current antihyperlipidemic regimen:  pravastatin '10mg'$  1 tablet daily omega-3 fatty acids '1200mg'$ , 1 capsule daily Previous antihyperlipidemic medications tried: Atorvastatin ASCVD risk enhancing conditions: age >3 and HTN  What recent interventions/DTPs have been made by any provider to improve Cholesterol control since last CPP Visit: Patient reports no changes Any recent hospitalizations or ED visits since last visit with CPP? No What diet changes have been made to improve Cholesterol?  Patient reports she does not always eat breakfast will have cereal but if she wakes late she wont have anything. For Lunch is she is out she will eat out or will have meat some veggies or salad if home. For dinner she reports she will have the rest of her lunch or a Frapachino or crackers  and Peanut Butter or something light. She reports she doesn't have the appetite to eat as much as she once had. What exercise is being done to improve Cholesterol?  Patient reports she is getting out every now and again today went for a hair cut and then went to the store and walked around a bit. She reports she is not able to walk for prolonged periods of time. She does walk her dog and take care of it as well. Notes: Patient reported that the only new things for her med's are her inhalers, she reports she has not needed to use the rescue one often last used after walking the dog outdoors on a hot day, and is only using the maintenance inhaler when she feels she needs it. Advised her of next appointment with the Clinical Pharmacist and that she would receive a reminder the day prior. She was in agreement.  Adherence Review: Does the patient have >5 day gap between last estimated fill dates? No    Care Gaps: Hepatitis C Screening - Overdue Zoster Vaccines - Overdue COVID Booster #4 Therapist, music) - Overdue Flu Vaccine - Overdue CCM FU- Scheduled 11-09-21 AWV - Scheduled 01-02-2022  Star Rating Drugs: Pravastatin (Pravachol) 10 mg - Last filled 06-29-2021 90 [facility] at [facility] Pharmacist Assistant [phone]

## 2021-10-11 DIAGNOSIS — H401423 Capsular glaucoma with pseudoexfoliation of lens, left eye, severe stage: Secondary | ICD-10-CM | POA: Diagnosis not present

## 2021-11-08 ENCOUNTER — Telehealth: Payer: Self-pay | Admitting: Pharmacist

## 2021-11-08 NOTE — Chronic Care Management (AMB) (Signed)
    Chronic Care Management Pharmacy Assistant   Name: Taelyn Broecker  MRN: [id number] DOB: Jun 12, 1941   11/08/21 APPOINTMENT REMINDER   Patient was reminded to have all medications, supplements and any blood glucose and blood pressure readings available for review with Jeni Salles, Pharm. D, for telephone visit on 11/09/21 at 10.   Care Gaps: Hepatitis C Screening - Overdue Zoster Vaccines - Overdue PNA Vaccines - [employer] COVID Booster #4 [employer]) - Overdue Flu Vaccine - Overdue AWV - Scheduled 12/2021  Star Rating Drug: Pravastatin (Pravachol) 10 mg - Last filled 10/02/2021 90 [facility] at [facility]  Any gaps in medications fill history? None  Medications: Outpatient Encounter Medications as of 11/08/2021  Medication Sig   acetaminophen (TYLENOL) 500 MG tablet Take 500 mg by mouth. Patient reports taking 1 to 2 tablets a day if needed.   albuterol (VENTOLIN HFA) 108 (90 Base) MCG/ACT inhaler Inhale 2 puffs into the lungs every 6 (six) hours as needed for wheezing or shortness of breath.   allopurinol (ZYLOPRIM) 300 MG tablet Take 1 tablet (300 mg total) by mouth daily.   ALPRAZolam (XANAX) 0.5 MG tablet TAKE 1 TABLET BY MOUTH AT  BEDTIME AS NEEDED FOR  SLEEP/ANXIETY   aspirin EC 81 MG tablet Take 81 mg by mouth daily.   CALCIUM-MAGNESIUM-ZINC PO Take 1 tablet by mouth daily.   docusate sodium (COLACE) 100 MG capsule Take 200 mg by mouth at bedtime.   Fluticasone-Umeclidin-Vilant (TRELEGY ELLIPTA) 100-62.5-25 MCG/INH AEPB Inhale 1 puff into the lungs daily.   HYDROcodone-acetaminophen (NORCO) 10-325 MG tablet Take 1-2 tablets by mouth every 6 (six) hours as needed.   latanoprost (XALATAN) 0.005 % ophthalmic solution Place 1 drop into the right eye at bedtime.   metoprolol succinate (TOPROL-XL) 50 MG 24 hr tablet Take with or immediately following a meal.   Multiple Vitamin (MULTIVITAMIN WITH MINERALS) TABS Take 1 tablet by mouth daily.   Omega-3 Fatty Acids 1200 MG CAPS Take  1,200 mg by mouth daily.   omeprazole (PRILOSEC) 40 MG capsule Take 1 capsule (40 mg total) by mouth daily.   pravastatin (PRAVACHOL) 10 MG tablet Take 1 tablet (10 mg total) by mouth every evening.   Probiotic Product (PROBIOTIC ADVANCED PO) Take by mouth daily.   sertraline (ZOLOFT) 50 MG tablet TAKE ONE HALF TABLET BY MOUTH ONCE DAILY FOR 2 WEEKS AND THEN INCREASE TO ONE TABLET BY MOUTH DAILY.   spironolactone (ALDACTONE) 25 MG tablet Take 0.5 tablets (12.5 mg total) by mouth daily.   No facility-administered encounter medications on file as of 11/08/2021.   [place] Clinical Pharmacist Assistant [phone]

## 2021-11-09 ENCOUNTER — Ambulatory Visit (INDEPENDENT_AMBULATORY_CARE_PROVIDER_SITE_OTHER): Payer: PPO | Admitting: Pharmacist

## 2021-11-09 DIAGNOSIS — Z780 Asymptomatic menopausal state: Secondary | ICD-10-CM

## 2021-11-09 DIAGNOSIS — I10 Essential (primary) hypertension: Secondary | ICD-10-CM

## 2021-11-09 DIAGNOSIS — E785 Hyperlipidemia, unspecified: Secondary | ICD-10-CM

## 2021-11-09 DIAGNOSIS — M8580 Other specified disorders of bone density and structure, unspecified site: Secondary | ICD-10-CM

## 2021-11-09 NOTE — Progress Notes (Signed)
Chronic Care Management Pharmacy Note  11/09/2021 Name:  Jessica Mcneil [facility] MRN:  [id number] DOB:  [birth year]  Summary: LDL not at goal < 70 Pt is getting over a cold  Recommendations/Changes made from today's visit: -Recommended for patient to call cardiology to discuss therapy change for cholesterol medication -Recommend repeat DEXA -Recommended bringing BP cuff to office to ensure accuracy -Recommended restarting Trelegy  Plan: Follow up HLD assessment in 2-3 months  Subjective: Jessica Mcneil is an 80 y.o. year old female who is a primary patient of Burchette, Jessica Sierras, MD.  The CCM team was consulted for assistance with disease management and care coordination needs.    Engaged with patient by telephone for follow up visit in response to provider referral for pharmacy case management and/or care coordination services.   Consent to Services:  The patient was given information about Chronic Care Management services, agreed to services, and gave verbal consent prior to initiation of services.  Please see initial visit note for detailed documentation.   Patient Care Team: Jessica Post, MD as PCP - General (Family Medicine) Jessica Margarita, MD as PCP - Cardiology (Cardiology) Jessica Mcneil, Jessica Mcneil as Pharmacist (Pharmacist)  Recent office visits: 12/27/20 Jessica Rakes, LPN: Patient presented for AWV.  06/20/21 Jessica Littler, MD: Patient presented for annual exam. Recommended shingrix and hep C antibody with next lab work.  Recent consult visits: 07-18-2021 Jessica Mcneil - Patient presented for Mammogram.  06/29/21 Jessica Him, MD (cardiology): Patient presented for HF follow up. Plan to switch pravastatin to rosuvastatin 10 mg daily.  05/09/21 Jessica Jackson, MD (pulmonary): Patient presented for ILD follow up. Increased omeprazole to 40 mg daily. Trial of Trelegy to replace Spiriva.  03/08/21 Jessica Jackson, MD (pulmonary): Patient presented for  abnormal PFTs. Prescribed spiriva 2.5 mcg 2 puffs daily.   01/25/21 Jessica Jackson, MD (pulmonary): Patient presented with dyspnea on exertion. PFTs completed and showed moderate restrictive process.  01/03/21 Jessica Jackson, MD (pulmonary): Patient presented for dyspnea on exertion consult. Prescribed albuterol PRN.  Mcneil visits: None in previous 6 months  Objective:  Lab Results  Component Value Date   CREATININE 0.94 06/29/2021   BUN 12 06/29/2021   GFR 48.78 (L) 02/17/2019   GFRNONAA 55 (L) 10/10/2020   GFRAA 64 10/10/2020   NA 140 06/29/2021   K 4.3 06/29/2021   CALCIUM 9.0 06/29/2021   CO2 24 06/29/2021    Lab Results  Component Value Date/Time   GFR 48.78 (L) 02/17/2019 02:43 PM   GFR 66.91 02/20/2016 08:40 AM    Last diabetic Eye exam: No results found for: HMDIABEYEEXA  Last diabetic Foot exam: No results found for: HMDIABFOOTEX   Lab Results  Component Value Date   CHOL 239 (H) 06/29/2021   HDL 52 06/29/2021   LDLCALC 161 (H) 06/29/2021   LDLDIRECT 216.7 06/05/2011   TRIG 144 06/29/2021   CHOLHDL 4.6 (H) 06/29/2021    Hepatic Function Latest Ref Rng & Units 06/29/2021 10/10/2020 02/17/2019  Total Protein 6.0 - 8.5 g/dL 6.8 6.9 6.5  Albumin 3.7 - 4.7 g/dL 4.6 4.3 4.1  AST 0 - 40 IU/L _0 ALT 0 - 32 IU/L _1 Alk Phosphatase 44 - 121 IU/L 92 92 76  Total Bilirubin 0.0 - 1.2 mg/dL 0.4 0.3 0.4  Bilirubin, Direct 0.0 - 0.3 mg/dL - - -    Lab Results  Component Value Date/Time   TSH 4.360 10/10/2020 08:30 AM   TSH  2.48 02/20/2016 08:40 AM    CBC Latest Ref Rng & Units 10/10/2020 11/24/2019 02/17/2019  WBC 3.4 - 10.8 x10E3/uL 8.5 7.3 7.8  Hemoglobin 11.1 - 15.9 g/dL 14.2 13.2 13.8  Hematocrit 34.0 - 46.6 % 43.0 41 41.7  Platelets 150 - 450 x10E3/uL 324 357 305.0    No results found for: VD25OH  Clinical ASCVD: No  The 10-year ASCVD risk score (Arnett DK, et al., 2019) is: 21.5%   Values used to calculate the score:     Age: 58 years      Sex: Female     Is Non-Hispanic African American: No     Diabetic: No     Tobacco smoker: No     Systolic Blood Pressure: 621 mmHg     Is BP treated: Yes     HDL Cholesterol: 52 mg/dL     Total Cholesterol: 239 mg/dL    Depression screen [facility] 2/9 12/27/2020 09/03/2019 06/16/2015  Decreased Interest 1 2 0  Down, Depressed, Hopeless 0 2 0  PHQ - 2 Score 1 4 0  Altered sleeping - 2 -  Tired, decreased energy - 3 -  Change in appetite - 2 -  Feeling bad or failure about yourself  - 1 -  Trouble concentrating - 1 -  Moving slowly or fidgety/restless - 1 -  Suicidal thoughts - 0 -  PHQ-9 Score - 14 -  Difficult doing work/chores - Not difficult at all -     Social History   Tobacco Use  Smoking Status Former   Packs/day: 2.00   Types: Cigarettes   Quit date: 12/23/1982   Years since quitting: 38.9  Smokeless Tobacco Never   BP Readings from Last 3 Encounters:  06/29/21 110/72  06/20/21 124/64  05/09/21 106/70   Pulse Readings from Last 3 Encounters:  06/29/21 74  06/20/21 75  05/09/21 74   Wt Readings from Last 3 Encounters:  06/29/21 164 lb 12.8 oz (74.8 kg)  06/20/21 167 lb 8 oz (76 kg)  05/09/21 168 lb (76.2 kg)    Assessment/Interventions: Review of patient past medical history, allergies, medications, health status, including review of consultants reports, laboratory and other test data, was performed as part of comprehensive evaluation and provision of chronic care management services.   SDOH:  (Social Determinants of Health) assessments and interventions performed: No   CCM Care Plan  Allergies  Allergen Reactions   Lipitor [Atorvastatin]     UNSPECIFIED REACTION    Codeine Itching   Erythromycin Nausea And Vomiting    GI UPSET   Sulfamethoxazole Itching    Over 40 years ago    Medications Reviewed Today     Reviewed by Jessica Mcneil, CMA (Certified Medical Assistant) on 06/29/21 at [place] List Status: <None>   Medication Order Taking? Sig  Documenting Provider Last Dose Status Informant  acetaminophen (TYLENOL) 500 MG tablet [id number] Yes Take 500 mg by mouth. Patient reports taking 1 to 2 tablets a day if needed. [provider] Taking Active Self  albuterol (VENTOLIN HFA) 108 (90 Base) MCG/ACT inhaler [id number] Yes Inhale 2 puffs into the lungs every 6 (six) hours as needed for wheezing or shortness of breath. [name], MD Taking Active   allopurinol (ZYLOPRIM) 300 MG tablet [id number] Yes TAKE 1 TABLET BY MOUTH EVERY DAY Burchette, Jessica Sierras, MD Taking Active   ALPRAZolam Duanne Moron) 0.5 MG tablet [id number] Yes TAKE 1 TABLET BY MOUTH AT  BEDTIME AS NEEDED FOR  Salvatore Marvel, MD Taking Active   aspirin EC 81 MG tablet [id number] Yes Take 81 mg by mouth daily. [provider] Taking Active Self  CALCIUM-MAGNESIUM-ZINC PO [id number] Yes Take 1 tablet by mouth daily. [provider] Taking Active Self  docusate sodium (COLACE) 100 MG capsule [id number] Yes Take 200 mg by mouth at bedtime. [provider] Taking Active Self  Fluticasone-Umeclidin-Vilant (TRELEGY ELLIPTA) 100-62.5-25 MCG/INH AEPB [id number] Yes Inhale 1 puff into the lungs daily. [name], MD Taking Active   HYDROcodone-acetaminophen [facility]) 10-325 MG tablet [id number] Yes Take 1-2 tablets by mouth every 6 (six) hours as needed. Marchia Bond, MD Taking Active   latanoprost (XALATAN) 0.005 % ophthalmic solution [id number] Yes Place 1 drop into the right eye at bedtime. [provider] Taking Active   metoprolol succinate (TOPROL-XL) 50 MG 24 hr tablet [id number] Yes Take with or immediately following a meal. Imogene Burn, PA-C Taking Active   Multiple Vitamin (MULTIVITAMIN WITH MINERALS) TABS [id number] Yes Take 1 tablet by mouth daily. [provider] Taking Active Self  Omega-3 Fatty Acids 1200 MG CAPS [id number] Yes Take 1,200 mg by mouth daily. [provider] Taking Active Self   omeprazole (PRILOSEC) 40 MG capsule [id number] Yes Take 1 capsule (40 mg total) by mouth daily. [name], MD Taking Active   pravastatin (PRAVACHOL) 10 MG tablet [id number] Yes Take 1 tablet (10 mg total) by mouth every evening. Imogene Burn, PA-C Taking Active   Probiotic Product (PROBIOTIC ADVANCED PO) [id number] Yes Take by mouth daily. [provider] Taking Active Self  sertraline (ZOLOFT) 50 MG tablet [id number] Yes TAKE ONE HALF TABLET BY MOUTH ONCE DAILY FOR 2 WEEKS AND THEN INCREASE TO ONE TABLET BY MOUTH DAILY. Jessica Post, MD Taking Active   spironolactone (ALDACTONE) 25 MG tablet [id number] Yes Take 0.5 tablets (12.5 mg total) by mouth daily. 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   Irritable bowel syndrome 05/09/2021   Right rotator cuff tear arthropathy 12/09/2017   Primary localized osteoarthrosis of shoulder 12/09/2017   Chronic right shoulder pain 05/29/2017   Osteoarthritis of hands, bilateral 05/29/2017   Primary osteoarthritis of both feet 05/29/2017   Gout 02/13/2016   Migraine headache with aura 05/08/2015   Primary open angle glaucoma of right eye, mild stage 11/73/5670   Chronic systolic heart failure ([place]) 07/18/2014   Depression 11/23/2013   GERD (gastroesophageal reflux disease) 11/23/2013   Hypertension 11/23/2013   Dilated cardiomyopathy ([place]) 11/23/2013   Capsular glaucoma with pseudoexfoliation of lens, left eye, 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/04/2013   HTN (hypertension) 08/03/2013   Abnormal mammogram with microcalcification 01/25/2013   Dyspnea 03/14/2011   HEADACHE 06/21/2010   ACUTE CHRONIC COMB SYSTOLIC&DIASTOLIC HEART FAIL 14/09/3012   History of depression 08/04/2009   Insomnia 08/04/2009   CONSTIPATION 06/22/2009   Hyperlipidemia 02/23/2009   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   Nonischemic cardiomyopathy ([place]) 02/23/2009     Immunization History  Administered Date(s) Administered   Fluad Quad(high Dose 65+) 09/03/2019   Influenza, High Dose Seasonal PF 10/11/2014, 10/07/2017, 02/17/2019, 10/03/2021   Influenza-Unspecified 09/04/2016, 10/07/2017, 09/27/2020   PFIZER(Purple Top)SARS-COV-2 Vaccination 01/30/2020, 02/24/2020, 09/21/2020   Pfizer Covid-19 Vaccine Bivalent Booster 29yr & up 10/03/2021   Pneumococcal Conjugate-13 02/13/2016, 10/07/2017   Pneumococcal Polysaccharide-23 12/23/2006   Td 12/23/2004   Tdap 02/13/2016  Patient is still getting over her stuffiness/cold symptoms. She is taking Mucinex and has going on for 4 weeks and she is slowly getting better. She doesn't have a fever right now and has been drinking a lot of liquids including hot tea and cold tea, sierra mist and water. Her cough does not keep her up at night and is getting better. She inquired about restarting Trelegy as she stopped it with the current cold.  Patient still hasn't called cardiology to discuss cholesterol medication change. She is aware she needs to call that office to schedule a follow up anyways.  Conditions to be addressed/monitored:  Hypertension, Hyperlipidemia, Heart Failure, GERD, Depression, Anxiety, Gout, and migraines, constipation  Conditions addressed this visit: Hypertension, Hyperlipidemia, ILD   Care Plan : [place]  Updates made by Jessica Mcneil, [place] since 11/09/2021 12:00 AM     Problem: Problem: Hypertension, Hyperlipidemia, Heart Failure, Anxiety, GERD, Gout and migraines, constipation      Long-Range Goal: Patient-Specific Goal   Start Date: 03/02/2021  Expected End Date: 03/02/2022  Recent Progress: On track  Priority: High  Note:   Current Barriers:  Unable to independently monitor therapeutic efficacy Unable to achieve control of cholesterol   Pharmacist Clinical Goal(s):  Patient will achieve adherence to monitoring guidelines and medication adherence to achieve  therapeutic efficacy achieve control of cholesterol as evidenced by next lipid panel  through collaboration with PharmD and provider.   Interventions: 1:1 collaboration with Jessica Post, MD regarding development and update of comprehensive plan of care as evidenced by provider attestation and co-signature Inter-disciplinary care team collaboration (see longitudinal plan of care) Comprehensive medication review performed; medication list updated in electronic medical record  Hypertension (BP goal <140/90) -Controlled -Current treatment: metoprolol succinate 64m, 1 tablet once daily with or immediately following a meal  spironolactone 257m 0.5 tablet daily -Medications previously tried:  lisinopril, losartan   -Current home readings: 110-120s/70-80s (never > 120) - not checking regularly - arm cuff -Current dietary habits: did not discuss -Current exercise habits: did not discuss -Reports hypotensive/hypertensive symptoms -Educated on Importance of home blood pressure monitoring; -Counseled to monitor BP at home at least weekly, document, and provide log at future appointments -Counseled on diet and exercise extensively Recommended to continue current medication  Hyperlipidemia: (LDL goal < 70) -Uncontrolled -Current treatment: pravastatin 2031m tablet daily omega-3 fatty acids 1200m35m capsule daily -Medications previously tried: atorvastatin  -Current dietary patterns: doesn't cook with oil, not eating as many fried foods; eats some ice cream; eats fruit every day -Current exercise habits: goes on short walks with her dog every day -Educated on Cholesterol goals;  Importance of limiting foods high in cholesterol; Exercise goal of 150 minutes per week; -Recommended to continue current medication Recommended reaching out to cardiology as they have been trying to get in touch with her to change medication.  Heart Failure (Goal: manage symptoms and prevent  exacerbations) -Controlled -Last ejection fraction: 45-50% (Date: n/a) -HF type: Combined Systolic and Diastolic -NYHA Class: II (slight limitation of activity) -AHA HF Stage: C (Heart disease and symptoms present) -Current treatment: metoprolol succinate 50mg69mtablet once daily spironolactone 25mg,43m tablet daily HCTZ 12.5mg, 151mpsule once daily as needed for swelling (patient stated she has not taken in a long time; unable to provide timeframe) -Medications previously tried:  lisinopril, losartan  -Current home BP/HR readings: BP usually 110-120s -Current dietary habits: did not discuss -Current exercise habits: goes on short walks with  her dog daily -Educated on Importance of weighing daily; if you gain more than 3 pounds in one day or 5 pounds in one week, call provider Proper diuretic administration and potassium supplementation -Recommended to continue current medication  ILD (Goal: control symptoms and prevent exacerbations) -Controlled -Current treatment  Trelegy 1 puff daily Albuterol HFA as needed -Medications previously tried: Spiriva  -Gold Grade: Gold 1 (FEV1>80%) -Current COPD Classification:  B (high sx, <2 exacerbations/yr) -MMRC/CAT score: 1 -Pulmonary function testing: 01/25/21 -Exacerbations requiring treatment in last 6 months: none -Patient reports consistent use of maintenance inhaler -Frequency of rescue inhaler use: a few times a week -Counseled on Proper inhaler technique; Benefits of consistent maintenance inhaler use When to use rescue inhaler -Recommended to continue current medication Assessed patient finances. Patient is ok wit paying $45 per month for Breztri.   Anxiety (Goal: minimize symptoms) -Uncontrolled -Current treatment: Sertraline 50 mg 1 tablet daily alprazolam 0.47m, 1 tablet at bedtime as needed for sleep/anxiety  -Medications previously tried/failed: none -PHQ9: 1 -GAD7: 3 -Educated on Benefits of medication for symptom  control Benefits of cognitive-behavioral therapy with or without medication -Recommended to continue current medication  Osteoporosis (Goal improve bone density and prevent fractures) -Uncontrolled -Last DEXA Scan: 09/08/19   T-Score femoral neck: -1.9, -1.8  T-Score total hip: n/a  T-Score lumbar spine: 0.1  T-Score forearm radius: n/a  10-year probability of major osteoporotic fracture: 20.6%  10-year probability of hip fracture: 5.1% -Patient is a candidate for pharmacologic treatment due to T-Score -1.0 to -2.5 and 10-year risk of major osteoporotic fracture > 20% and T-Score -1.0 to -2.5 and 10-year risk of hip fracture > 3% -Current treatment  Calcium-mag-zinc with D3 (D3 200 units, 333 mg of calcium) 1 at night  Multivitamin daily (800 units of vitamin D) -Medications previously tried: alendronate (GI side effects) -Recommend [phone] units of vitamin D daily. Recommend 1200 mg of calcium daily from dietary and supplemental sources. Recommend weight-bearing and muscle strengthening exercises for building and maintaining bone density. -Recommended switching to calcium citrate with chronic PPI therapy Recommend repeat DEXA.  Gout (Goal: uric acid < 6 and prevent flare ups) -Controlled -Current treatment  allopurinol 3074m 1 tablet daily colchicine 0.36m107m1 tablet daily as needed -Medications previously tried: none  -Counseled on eating a low-purine diet. Avoid foods and drinks such as: liver, kidney, anchovies, asparagus, herring, mushrooms, mussels, beer,etc.  Migraines (Goal: limit severity and frequency of migraines) -Controlled -Current treatment  hydrocodone/ APAP 10/325m35m to 2 tablets every six hours as needed (for bad headaches- not taken for >6 months ) Tramadol 50 mg every 6 hours for pain (came from orthopedic surgeon)  Tylenol 500mg70mkes 1 to 2 tablets a day if needed. Patient notes does not very often -Medications previously tried: n/a  -Recommended to  continue current medication  GERD/hiatal hernia (Goal: minimize symptoms of acid reflux/heartburn) -Controlled -Current treatment  Omeprazole 20mg,68mapsule daily  -Medications previously tried: none  -Recommended to continue current medication Counseled on non-pharmacologic management of symptoms such as elevating the head of your bed, avoiding eating 2-3 hours before bed, avoiding triggering foods such as acidic, spicy, or fatty foods, eating smaller meals, and wearing clothes that are loose around the waist   Health Maintenance -Vaccine gaps: pneumovax -Current therapy:  Probiotic 1 capsule once daily Aspirin 81 mg 1 tablet daily Multivitamin 1 tablet daily (100 mg calcium, vitamin D 1000 units) -Educated on Cost vs benefit of each product must be carefully weighed  by individual consumer -Patient is satisfied with current therapy and denies issues -Recommended to continue current medication  Patient Goals/Self-Care Activities Patient will:  - take medications as prescribed target a minimum of 150 minutes of moderate intensity exercise weekly  Follow Up Plan: Telephone follow up appointment with care management team member scheduled for: 4 months       Medication Assistance: None required.  Patient affirms current coverage meets needs.  Compliance/Adherence/Medication fill history: Care Gaps: Shingrix, COVID booster, PNA, Hep C screening, influenza BP: 110/72  Star-Rating Drugs: Pravastatin (Pravachol) 10 mg - Last [facility] 10/02/2021 [facility]  Patient's preferred pharmacy is:  [id number] [place] [address] [place]Phone: [phone]: [phone] Uses pill box? Yes Pt endorses 100% compliance  We discussed: Current pharmacy is preferred with insurance plan and patient is satisfied with pharmacy services Patient decided to: Continue current medication management strategy  Care Plan and Follow Up  Patient Decision:  Patient agrees to Care Plan and Follow-up.  Plan: Telephone follow up appointment with care management team member scheduled for:  6 months    MJeni Salles PharmD BBerkleyPharmacist L[place]cordat [phone]

## 2021-11-09 NOTE — Patient Instructions (Signed)
Hi Chrystle,  It was great to get catch up with you! I hope you feel better soon.  Please reach out to me if you have any questions or need anything before our follow up!  Best, Maddie  Jeni Salles, PharmD, [facility]   Visit Information   Goals Addressed   None    Patient Care Plan: CCM Pharmacy Care Plan     Problem Identified: Problem: Hypertension, Hyperlipidemia, Heart Failure, Anxiety, GERD, Gout and migraines, constipation      Long-Range Goal: Patient-Specific Goal   Start Date: 03/02/2021  Expected End Date: 03/02/2022  Recent Progress: On track  Priority: High  Note:   Current Barriers:  Unable to independently monitor therapeutic efficacy Unable to achieve control of cholesterol   Pharmacist Clinical Goal(s):  Patient will achieve adherence to monitoring guidelines and medication adherence to achieve therapeutic efficacy achieve control of cholesterol as evidenced by next lipid panel  through collaboration with PharmD and provider.   Interventions: 1:1 collaboration with Eulas Post, MD regarding development and update of comprehensive plan of care as evidenced by provider attestation and co-signature Inter-disciplinary care team collaboration (see longitudinal plan of care) Comprehensive medication review performed; medication list updated in electronic medical record  Hypertension (BP goal <140/90) -Controlled -Current treatment: metoprolol succinate 50mg , 1 tablet once daily with or immediately following a meal  spironolactone 25mg , 0.5 tablet daily -Medications previously tried:  lisinopril, losartan   -Current home readings: 110-120s/70-80s (never > 120) - not checking regularly - arm cuff -Current dietary habits: did not discuss -Current exercise habits: did not discuss -Reports hypotensive/hypertensive symptoms -Educated on Importance of home blood pressure  monitoring; -Counseled to monitor BP at home at least weekly, document, and provide log at future appointments -Counseled on diet and exercise extensively Recommended to continue current medication  Hyperlipidemia: (LDL goal < 70) -Uncontrolled -Current treatment: pravastatin 20mg  1 tablet daily omega-3 fatty acids 1200mg , 1 capsule daily -Medications previously tried: atorvastatin  -Current dietary patterns: doesn't cook with oil, not eating as many fried foods; eats some ice cream; eats fruit every day -Current exercise habits: goes on short walks with her dog every day -Educated on Cholesterol goals;  Importance of limiting foods high in cholesterol; Exercise goal of 150 minutes per week; -Recommended to continue current medication Recommended reaching out to cardiology as they have been trying to get in touch with her to change medication.  Heart Failure (Goal: manage symptoms and prevent exacerbations) -Controlled -Last ejection fraction: 45-50% (Date: n/a) -HF type: Combined Systolic and Diastolic -NYHA Class: II (slight limitation of activity) -AHA HF Stage: C (Heart disease and symptoms present) -Current treatment: metoprolol succinate 50mg , 1 tablet once daily spironolactone 25mg , 0.5 tablet daily HCTZ 12.5mg , 1 capsule once daily as needed for swelling (patient stated she has not taken in a long time; unable to provide timeframe) -Medications previously tried:  lisinopril, losartan  -Current home BP/HR readings: BP usually 110-120s -Current dietary habits: did not discuss -Current exercise habits: goes on short walks with her dog daily -Educated on Importance of weighing daily; if you gain more than 3 pounds in one day or 5 pounds in one week, call provider Proper diuretic administration and potassium supplementation -Recommended to continue current medication  ILD (Goal: control symptoms and prevent exacerbations) -Controlled -Current treatment  Trelegy 1 puff  daily Albuterol HFA as needed -Medications previously tried: Spiriva  -Gold Grade: Gold 1 (FEV1>80%) -Current COPD Classification:  B (high sx, <  2 exacerbations/yr) -MMRC/CAT score: 1 -Pulmonary function testing: 01/25/21 -Exacerbations requiring treatment in last 6 months: none -Patient reports consistent use of maintenance inhaler -Frequency of rescue inhaler use: a few times a week -Counseled on Proper inhaler technique; Benefits of consistent maintenance inhaler use When to use rescue inhaler -Recommended to continue current medication Assessed patient finances. Patient is ok wit paying $45 per month for Breztri.   Anxiety (Goal: minimize symptoms) -Uncontrolled -Current treatment: Sertraline 50 mg 1 tablet daily alprazolam 0.5mg , 1 tablet at bedtime as needed for sleep/anxiety  -Medications previously tried/failed: none -PHQ9: 1 -GAD7: 3 -Educated on Benefits of medication for symptom control Benefits of cognitive-behavioral therapy with or without medication -Recommended to continue current medication  Osteoporosis (Goal improve bone density and prevent fractures) -Uncontrolled -Last DEXA Scan: 09/08/19   T-Score femoral neck: -1.9, -1.8  T-Score total hip: n/a  T-Score lumbar spine: 0.1  T-Score forearm radius: n/a  10-year probability of major osteoporotic fracture: 20.6%  10-year probability of hip fracture: 5.1% -Patient is a candidate for pharmacologic treatment due to T-Score -1.0 to -2.5 and 10-year risk of major osteoporotic fracture > 20% and T-Score -1.0 to -2.5 and 10-year risk of hip fracture > 3% -Current treatment  Calcium-mag-zinc with D3 (D3 200 units, 333 mg of calcium) 1 at night  Multivitamin daily (800 units of vitamin D) -Medications previously tried: alendronate (GI side effects) -Recommend [phone] units of vitamin D daily. Recommend 1200 mg of calcium daily from dietary and supplemental sources. Recommend weight-bearing and muscle strengthening  exercises for building and maintaining bone density. -Recommended switching to calcium citrate with chronic PPI therapy Recommend repeat DEXA.  Gout (Goal: uric acid < 6 and prevent flare ups) -Controlled -Current treatment  allopurinol 300mg , 1 tablet daily colchicine 0.6mg , 1 tablet daily as needed -Medications previously tried: none  -Counseled on eating a low-purine diet. Avoid foods and drinks such as: liver, kidney, anchovies, asparagus, herring, mushrooms, mussels, beer,etc.  Migraines (Goal: limit severity and frequency of migraines) -Controlled -Current treatment  hydrocodone/ APAP 10/325mg , 1 to 2 tablets every six hours as needed (for bad headaches- not taken for >6 months ) Tramadol 50 mg every 6 hours for pain (came from orthopedic surgeon)  Tylenol 500mg , takes 1 to 2 tablets a day if needed. Patient notes does not very often -Medications previously tried: n/a  -Recommended to continue current medication  GERD/hiatal hernia (Goal: minimize symptoms of acid reflux/heartburn) -Controlled -Current treatment  Omeprazole 20mg , 1 capsule daily  -Medications previously tried: none  -Recommended to continue current medication Counseled on non-pharmacologic management of symptoms such as elevating the head of your bed, avoiding eating 2-3 hours before bed, avoiding triggering foods such as acidic, spicy, or fatty foods, eating smaller meals, and wearing clothes that are loose around the waist   Health Maintenance -Vaccine gaps: pneumovax -Current therapy:  Probiotic 1 capsule once daily Aspirin 81 mg 1 tablet daily Multivitamin 1 tablet daily (100 mg calcium, vitamin D 1000 units) -Educated on Cost vs benefit of each product must be carefully weighed by individual consumer -Patient is satisfied with current therapy and denies issues -Recommended to continue current medication  Patient Goals/Self-Care Activities Patient will:  - take medications as prescribed target a  minimum of 150 minutes of moderate intensity exercise weekly  Follow Up Plan: Telephone follow up appointment with care management team member scheduled for: 4 months       Patient verbalizes understanding of instructions provided today and agrees to view in  MyChart.  The pharmacy team will reach out to the patient again over the next 30 days.   Viona Gilmore, [facility]

## 2021-11-11 NOTE — Progress Notes (Signed)
Patient ID: Jessica Mcneil, female   DOB: 08/01/1941, 80 y.o.   MRN: [id number]   DEXA ordered.  Please let patient know so she can get scheduled.    Eulas Post MD  Primary [facility] at [facility]

## 2021-11-11 NOTE — Addendum Note (Signed)
Addended by: Eulas Post on: 11/11/2021 01:37 PM   Modules accepted: Orders

## 2021-11-18 ENCOUNTER — Other Ambulatory Visit: Payer: Self-pay | Admitting: Family Medicine

## 2021-11-21 DIAGNOSIS — E785 Hyperlipidemia, unspecified: Secondary | ICD-10-CM

## 2021-11-21 DIAGNOSIS — I10 Essential (primary) hypertension: Secondary | ICD-10-CM | POA: Diagnosis not present

## 2022-01-01 ENCOUNTER — Other Ambulatory Visit: Payer: Self-pay | Admitting: Family Medicine

## 2022-01-02 ENCOUNTER — Ambulatory Visit: Payer: PPO

## 2022-01-23 ENCOUNTER — Telehealth: Payer: Self-pay | Admitting: Pharmacist

## 2022-01-23 NOTE — Chronic Care Management (AMB) (Signed)
Chronic Care Management Pharmacy Assistant   Name: Jessica Mcneil  MRN: [id number] DOB: 1941-08-25  Reason for Encounter: Disease State   Conditions to be addressed/monitored: HTN and HLD  Recent office visits:  None  Recent consult visits:  None  Hospital visits:  None in previous 6 months  Medications: Outpatient Encounter Medications as of 01/23/2022  Medication Sig   acetaminophen (TYLENOL) 500 MG tablet Take 500 mg by mouth. Patient reports taking 1 to 2 tablets a day if needed.   albuterol (VENTOLIN HFA) 108 (90 Base) MCG/ACT inhaler Inhale 2 puffs into the lungs every 6 (six) hours as needed for wheezing or shortness of breath.   allopurinol (ZYLOPRIM) 300 MG tablet TAKE 1 TABLET BY MOUTH EVERY DAY   ALPRAZolam (XANAX) 0.5 MG tablet TAKE 1 TABLET BY MOUTH AT  BEDTIME AS NEEDED FOR  SLEEP/ANXIETY   aspirin EC 81 MG tablet Take 81 mg by mouth daily.   CALCIUM-MAGNESIUM-ZINC PO Take 1 tablet by mouth daily.   docusate sodium (COLACE) 100 MG capsule Take 200 mg by mouth at bedtime.   Fluticasone-Umeclidin-Vilant (TRELEGY ELLIPTA) 100-62.5-25 MCG/INH AEPB Inhale 1 puff into the lungs daily.   HYDROcodone-acetaminophen (NORCO) 10-325 MG tablet Take 1-2 tablets by mouth every 6 (six) hours as needed.   latanoprost (XALATAN) 0.005 % ophthalmic solution Place 1 drop into the right eye at bedtime.   metoprolol succinate (TOPROL-XL) 50 MG 24 hr tablet Take with or immediately following a meal.   Multiple Vitamin (MULTIVITAMIN WITH MINERALS) TABS Take 1 tablet by mouth daily.   Omega-3 Fatty Acids 1200 MG CAPS Take 1,200 mg by mouth daily.   omeprazole (PRILOSEC) 40 MG capsule Take 1 capsule (40 mg total) by mouth daily.   pravastatin (PRAVACHOL) 10 MG tablet Take 1 tablet (10 mg total) by mouth every evening.   Probiotic Product (PROBIOTIC ADVANCED PO) Take by mouth daily.   sertraline (ZOLOFT) 50 MG tablet TAKE ONE HALF TABLET BY MOUTH ONCE DAILY FOR 2 WEEKS AND THEN  INCREASE TO ONE TABLET BY MOUTH DAILY.   spironolactone (ALDACTONE) 25 MG tablet Take 0.5 tablets (12.5 mg total) by mouth daily.   No facility-administered encounter medications on file as of 01/23/2022.  Reviewed chart prior to disease state call. Spoke with patient regarding BP  Recent Office Vitals: BP Readings from Last 3 Encounters:  06/29/21 110/72  06/20/21 124/64  05/09/21 106/70   Pulse Readings from Last 3 Encounters:  06/29/21 74  06/20/21 75  05/09/21 74    Wt Readings from Last 3 Encounters:  06/29/21 164 lb 12.8 oz (74.8 kg)  06/20/21 167 lb 8 oz (76 kg)  05/09/21 168 lb (76.2 kg)     Kidney Function Lab Results  Component Value Date/Time   CREATININE 0.94 06/29/2021 08:33 AM   CREATININE 0.99 10/10/2020 08:30 AM   CREATININE 0.83 05/02/2017 09:02 AM   CREATININE 0.83 04/24/2016 07:52 AM   GFR 48.78 (L) 02/17/2019 02:43 PM   GFRNONAA 55 (L) 10/10/2020 08:30 AM   GFRNONAA 70 05/02/2017 09:02 AM   GFRAA 64 10/10/2020 08:30 AM   GFRAA 80 05/02/2017 09:02 AM    BMP Latest Ref Rng & Units 06/29/2021 10/10/2020 02/17/2019  Glucose 65 - 99 mg/dL 99 97 129(H)  BUN 8 - 27 mg/dL 12 13 16   Creatinine 0.57 - 1.00 mg/dL 0.94 0.99 1.09  BUN/Creat Ratio 12 - 28 13 13  -  Sodium 134 - 144 mmol/L 140 139 138  Potassium 3.5 -  5.2 mmol/L 4.3 4.7 4.2  Chloride 96 - 106 mmol/L 102 100 101  CO2 20 - 29 mmol/L 24 25 29   Calcium 8.7 - 10.3 mg/dL 9.0 9.4 8.7    Current antihypertensive regimen:  metoprolol succinate 50mg , 1 tablet once daily with or immediately following a meal  spironolactone 25mg , 0.5 tablet daily How often are you checking your Blood Pressure? weekly Current home BP readings: 120/70 patient reports she stays close to this number, she denies any hypo/hypertensive symptoms. What recent interventions/DTPs have been made by any provider to improve Blood Pressure control since last CPP Visit: Patient reports no changes Any recent hospitalizations or ED visits  since last visit with CPP? No  Notes:  Per  MP : Recommended reaching out to cardiology as they have been trying to get in touch with her to change medication, Patient reports she has not gotten back in touch with them has their phone number and will be calling them as its almost time for her to return for a follow up.  Adherence Review: Is the patient currently on ACE/ARB medication? No Does the patient have >5 day gap between last estimated fill dates? No  Care Gaps: Hepatitis C Screening - Overdue Zoster Vaccines - Overdue PNA Vaccines - Overdue AWV - 1/22 BP- 110/72 ( 06/29/21) CCM- 5/23  Star Rating Drugs: Pravastatin (Pravachol) 10 mg - Last filled 01/02/22 90 DS at [facility]  Patient Assistance: None   [place] Clinical Pharmacist Assistant [phone]

## 2022-02-12 ENCOUNTER — Other Ambulatory Visit: Payer: Self-pay | Admitting: Family Medicine

## 2022-02-18 DIAGNOSIS — H401423 Capsular glaucoma with pseudoexfoliation of lens, left eye, severe stage: Secondary | ICD-10-CM | POA: Diagnosis not present

## 2022-03-04 ENCOUNTER — Other Ambulatory Visit: Payer: Self-pay

## 2022-03-04 ENCOUNTER — Ambulatory Visit: Payer: PPO | Admitting: Pulmonary Disease

## 2022-03-04 ENCOUNTER — Encounter: Payer: Self-pay | Admitting: Pulmonary Disease

## 2022-03-04 VITALS — BP 116/64 | HR 74 | Ht 67.0 in | Wt 163.2 lb

## 2022-03-04 DIAGNOSIS — R942 Abnormal results of pulmonary function studies: Secondary | ICD-10-CM

## 2022-03-04 DIAGNOSIS — K219 Gastro-esophageal reflux disease without esophagitis: Secondary | ICD-10-CM

## 2022-03-04 DIAGNOSIS — J849 Interstitial pulmonary disease, unspecified: Secondary | ICD-10-CM

## 2022-03-04 DIAGNOSIS — R0609 Other forms of dyspnea: Secondary | ICD-10-CM | POA: Diagnosis not present

## 2022-03-04 MED ORDER — FLUTICASONE-UMECLIDIN-VILANT 100-62.5-25 MCG/ACT IN AEPB: 1.0000 | INHALATION_SPRAY | Freq: Every day | RESPIRATORY_TRACT | 6 refills | Status: DC

## 2022-03-04 MED ORDER — ALBUTEROL SULFATE HFA 108 (90 BASE) MCG/ACT IN AERS: 2.0000 | INHALATION_SPRAY | Freq: Four times a day (QID) | RESPIRATORY_TRACT | 6 refills | Status: DC | PRN

## 2022-03-04 NOTE — Patient Instructions (Signed)
We will schedule you for repeat PFTs at earliest possible date and a 6 minute walk test. ? ?We will repeat a high resolution CT chest scan  ? ?Please fill out the ILD questionnaire and bring back with you for follow up visit. ? ?We will check overnight oximetry test to check your oxygen levels while sleeping ? ?Follow up in 4-6 weeks. ?

## 2022-03-04 NOTE — Progress Notes (Unsigned)
Synopsis: Referred in 12/2020 for shortness of breath by Ermalinda Barrios, PA  Subjective:   PATIENT ID: Jessica Mcneil GENDER: female DOB: [birth year], MRN: [id number]  SOB, not walking dogs as far due to the dyspnea.  GERD better on '40mg'$  omeprazole daily and head of bed elevated Using trelegy daily and albuterol at least 2 times per day. She finds relief from sitting down.  Hand pains, some morning stiffness and geling throughout the day. No auto immune conditions in the family. Decreased physical activity.   HPI  Chief Complaint  Patient presents with   Follow-up    F/U on SOB. States the SOB has increased over the past 2-3 months, especially with exertion.    Jessica Mcneil is a 81 year old woman, former smoker with nonischemic cardiomyopathy, hypertension, hyperlipidemia and GERD who returns to pulmonary clinic for progressive dyspnea on exertion.   She had PFTs done on 01/25/21 and she has moderate restrictive defect and mild diffusion defect.   High resolution CT chest on 03/15/21 shows mild patchy confluent subpleural reticulation and ground glass opacities in both lungs with mild traction bronchiectasis.   She has benefited from daily Spiriva use and as needed albuterol use. She does report coughing up mucous after using Spiriva in the mornings.    She continues to have frequent GERD symptoms despite being on '20mg'$  of omeprazole a day. She has a small hiatal hernia noted on CT chest.   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   Shortness of breath      Family History   Problem Relation Age of Onset   Heart disease Father    Arthritis Other    Hyperlipidemia Other    Breast cancer Neg Hx      Social History   Socioeconomic History   Marital status: Divorced    Spouse name: Not on file   Number of children: Not on file   Years of education: Not on file   Highest education level: Not on file  Occupational History   Occupation: retired  Tobacco Use   Smoking status: Former    Packs/day: 2.00    Types: Cigarettes    Quit date: 12/23/1982    Years since quitting: 39.2   Smokeless tobacco: Never  Vaping Use   Vaping Use: Never used  Substance and Sexual Activity   Alcohol use: No   Drug use: No   Sexual activity: Not on file  Other Topics Concern   Not on file  Social History Narrative   Not on file   Social Determinants of Health   Financial Resource Strain: Not on file  Food Insecurity: Not on file  Transportation Needs: Not on file  Physical Activity: Not on file  Stress: Not on file  Social Connections: Not on file  Intimate Partner Violence: Not on file     Allergies  Allergen Reactions   Lipitor [Atorvastatin]     UNSPECIFIED REACTION    Codeine Itching   Erythromycin Nausea And Vomiting  GI UPSET   Sulfamethoxazole Itching    Over 40 years ago     Outpatient Medications Prior to Visit  Medication Sig Dispense Refill   acetaminophen (TYLENOL) 500 MG tablet Take 500 mg by mouth. Patient reports taking 1 to 2 tablets a day if needed.     albuterol (VENTOLIN HFA) 108 (90 Base) MCG/ACT inhaler Inhale 2 puffs into the lungs every 6 (six) hours as needed for wheezing or shortness of breath. 8 g 6   allopurinol (ZYLOPRIM) 300 MG tablet TAKE 1 TABLET BY MOUTH EVERY DAY 90 tablet 0   ALPRAZolam (XANAX) 0.5 MG tablet TAKE 1 TABLET BY MOUTH AT  BEDTIME AS NEEDED FOR  SLEEP/ANXIETY 90 tablet 0   aspirin EC 81 MG tablet Take 81 mg by mouth daily.     CALCIUM-MAGNESIUM-ZINC PO Take 1 tablet by mouth daily.     docusate sodium  (COLACE) 100 MG capsule Take 200 mg by mouth at bedtime.     Fluticasone-Umeclidin-Vilant (TRELEGY ELLIPTA) 100-62.5-25 MCG/INH AEPB Inhale 1 puff into the lungs daily. 60 each 6   HYDROcodone-acetaminophen (NORCO) 10-325 MG tablet Take 1-2 tablets by mouth every 6 (six) hours as needed. 50 tablet 0   latanoprost (XALATAN) 0.005 % ophthalmic solution Place 1 drop into the right eye at bedtime.     metoprolol succinate (TOPROL-XL) 50 MG 24 hr tablet Take with or immediately following a meal. 90 tablet 3   Multiple Vitamin (MULTIVITAMIN WITH MINERALS) TABS Take 1 tablet by mouth daily.     Omega-3 Fatty Acids 1200 MG CAPS Take 1,200 mg by mouth daily.     omeprazole (PRILOSEC) 40 MG capsule Take 1 capsule (40 mg total) by mouth daily. 90 capsule 3   pravastatin (PRAVACHOL) 10 MG tablet Take 1 tablet (10 mg total) by mouth every evening. 90 tablet 3   Probiotic Product (PROBIOTIC ADVANCED PO) Take by mouth daily.     sertraline (ZOLOFT) 50 MG tablet TAKE ONE HALF TABLET BY MOUTH ONCE DAILY FOR 2 WEEKS AND THEN INCREASE TO ONE TABLET BY MOUTH DAILY. 90 tablet 2   spironolactone (ALDACTONE) 25 MG tablet Take 0.5 tablets (12.5 mg total) by mouth daily. 45 tablet 3   No facility-administered medications prior to visit.    Review of Systems  Constitutional:  Negative for chills, fever, malaise/fatigue and weight loss.  HENT:  Negative for congestion, sinus pain and sore throat.   Eyes: Negative.   Respiratory:  Positive for shortness of breath (with exertion). Negative for cough, hemoptysis, sputum production and wheezing (intermittent).   Cardiovascular:  Negative for chest pain, palpitations, orthopnea, claudication, leg swelling and PND.  Gastrointestinal:  Positive for heartburn. Negative for abdominal pain, nausea and vomiting.  Genitourinary: Negative.   Musculoskeletal: Negative.   Skin:  Negative for rash.  Neurological:  Negative for dizziness, weakness and headaches.   Endo/Heme/Allergies: Negative.   Psychiatric/Behavioral: Negative.    Objective:   Vitals:   03/04/22 1019  BP: 116/64  Pulse: 74  SpO2: 99%  Weight: 163 lb 3.2 oz (74 kg)  Height: '5\' 7"'$  (1.702 m)     Physical Exam Constitutional:      General: She is not in acute distress.    Appearance: Normal appearance. She is normal weight. She is not ill-appearing.  HENT:     Head: Normocephalic and atraumatic.  Eyes:     General: No scleral icterus.    Conjunctiva/sclera: Conjunctivae normal.  Cardiovascular:     Rate and Rhythm:  Normal rate and regular rhythm.     Pulses: Normal pulses.     Heart sounds: Normal heart sounds. No murmur heard. Pulmonary:     Effort: Pulmonary effort is normal. No respiratory distress.     Breath sounds: No wheezing, rhonchi or rales.  Abdominal:     General: Bowel sounds are normal.     Palpations: Abdomen is soft.  Musculoskeletal:     Right lower leg: No edema.     Left lower leg: No edema.  Skin:    General: Skin is warm and dry.  Neurological:     General: No focal deficit present.     Mental Status: She is alert.  Psychiatric:        Mood and Affect: Mood normal.        Behavior: Behavior normal.        Thought Content: Thought content normal.        Judgment: Judgment normal.   CBC    Component Value Date/Time   WBC 8.5 10/10/2020 0830   WBC 7.8 02/17/2019 1443   RBC 4.76 10/10/2020 0830   RBC 4.53 11/24/2019 0000   HGB 14.2 10/10/2020 0830   HCT 43.0 10/10/2020 0830   PLT 324 10/10/2020 0830   MCV 90 10/10/2020 0830   MCH 29.8 10/10/2020 0830   MCH 30.2 12/10/2017 0355   MCHC 33.0 10/10/2020 0830   MCHC 33.1 02/17/2019 1443   RDW 14.5 10/10/2020 0830   LYMPHSABS 1.7 02/17/2019 1443   MONOABS 0.9 02/17/2019 1443   EOSABS 0.3 02/17/2019 1443   BASOSABS 0.1 02/17/2019 1443   BMP Latest Ref Rng & Units 06/29/2021 10/10/2020 02/17/2019  Glucose 65 - 99 mg/dL 99 97 129(H)  BUN 8 - 27 mg/dL '12 13 16  '$ Creatinine 0.57 - 1.00  mg/dL 0.94 0.99 1.09  BUN/Creat Ratio 12 - '28 13 13 '$ -  Sodium 134 - 144 mmol/L 140 139 138  Potassium 3.5 - 5.2 mmol/L 4.3 4.7 4.2  Chloride 96 - 106 mmol/L 102 100 101  CO2 20 - 29 mmol/L '24 25 29  '$ Calcium 8.7 - 10.3 mg/dL 9.0 9.4 8.7   Chest imaging: HRCT Chest 03/15/21  Two solid right middle lobe pulmonary nodules, largest 5 mm (series 8/image 84). Noadditional significant pulmonary nodules. No significant air trapping or evidence of tracheobronchomalacia on the expiration sequence. Mild patchy confluent subpleural reticulation and ground-glass opacity in both lungs with associated mild traction bronchiectasis and minimal architectural distortion. There is a basilar predominance to these findings. No frank honeycombing.  CXR 01/03/2021 Heart and mediastinal contours are within normal limits. No focal opacities or effusions. No acute bony abnormality.  CXR 02/01/2013 Findings: Cardiomediastinal silhouette is stable.  No acute  infiltrate or pleural effusion.  No pulmonary edema.  Bony thorax  is unremarkable. Again noted chronic mild interstitial prominence.   PFT: PFT Results Latest Ref Rng & Units 01/25/2021  FVC-Pre L 1.93  FVC-Predicted Pre % 68  FVC-Post L 2.14  FVC-Predicted Post % 75  Pre FEV1/FVC % % 95  Post FEV1/FCV % % 84  FEV1-Pre L 1.84  FEV1-Predicted Pre % 87  FEV1-Post L 1.80  DLCO uncorrected ml/min/mmHg 14.24  DLCO UNC% % 72  DLCO corrected ml/min/mmHg 14.24  DLCO COR %Predicted % 72  DLVA Predicted % 102  01/25/21: Mild restrictive defect and mild diffusion defect  Echo: 10/30/2020  1. Left ventricular ejection fraction, by estimation, is 50 to 55%. The  left ventricle has low normal function.  The left ventricle has no regional  wall motion abnormalities. Left ventricular diastolic parameters are  consistent with Grade I diastolic  dysfunction (impaired relaxation). The average left ventricular global  longitudinal strain is -20.4 %.   2. Right ventricular  systolic function is moderately reduced. The right  ventricular size is normal. There is normal pulmonary artery systolic  pressure. The estimated right ventricular systolic pressure is 09.3 mmHg.   3. The mitral valve is normal in structure. Trivial mitral valve  regurgitation. No evidence of mitral stenosis.   4. The aortic valve is tricuspid. Aortic valve regurgitation is mild.  Mild aortic valve sclerosis is present, with no evidence of aortic valve  stenosis.   5. The inferior vena cava is normal in size with greater than 50%  respiratory variability, suggesting right atrial pressure of 3 mmHg.   Home Sleep Study 04/03/21 Mild Obstructive Sleep apnea - 5.3 AHI/hr, nocturnal hypoxia  Assessment & Plan:   No diagnosis found.  Discussion: Jessica Mcneil is a 81 year old woman, former smoker with nonischemic cardiomyopathy, hypertension, hyperlipidemia and GERD who returns to pulmonary clinic for progressive dyspnea on exertion.  She has mild interstitial lung disease involvement as noted by her recent high resolution CT chest showing subpleural reticulation and mild traction bronchiectasis. She has moderate restrictive defect and mild diffusion defect on pulmonary function testing. The etiology of her lung disease is likely secondary to her previous history of smoking and ongoing reflux symptoms.   We will increase her omeprazole to '40mg'$  daily and recommended she continue to sleep with her head elevated. We will trial her on Trelegy Ellipta today as she has benefitted from spiriva and PRN albuterol. If she notices benefit, then we will send in a prescription.  We will check labs today to evaluate for possible inflammatory component of her lung disease.   She had sleep study performed which showed mild obstructive sleep apnea and nocturnal hypoxia. Given her reduced RV function on cardiac echo, I did recommend starting nocturnal oxygen therapy but she wishes to hold off at this time and we  will discuss at the next visit.    Follow up in 6 months  Freda Jackson, MD [facility] Office: [phone]    Current Outpatient Medications:    acetaminophen (TYLENOL) 500 MG tablet, Take 500 mg by mouth. Patient reports taking 1 to 2 tablets a day if needed., Disp: , Rfl:    albuterol (VENTOLIN HFA) 108 (90 Base) MCG/ACT inhaler, Inhale 2 puffs into the lungs every 6 (six) hours as needed for wheezing or shortness of breath., Disp: 8 g, Rfl: 6   allopurinol (ZYLOPRIM) 300 MG tablet, TAKE 1 TABLET BY MOUTH EVERY DAY, Disp: 90 tablet, Rfl: 0   ALPRAZolam (XANAX) 0.5 MG tablet, TAKE 1 TABLET BY MOUTH AT  BEDTIME AS NEEDED FOR  SLEEP/ANXIETY, Disp: 90 tablet, Rfl: 0   aspirin EC 81 MG tablet, Take 81 mg by mouth daily., Disp: , Rfl:    CALCIUM-MAGNESIUM-ZINC PO, Take 1 tablet by mouth daily., Disp: , Rfl:    docusate sodium (COLACE) 100 MG capsule, Take 200 mg by mouth at bedtime., Disp: , Rfl:    Fluticasone-Umeclidin-Vilant (TRELEGY ELLIPTA) 100-62.5-25 MCG/INH AEPB, Inhale 1 puff into the lungs daily., Disp: 60 each, Rfl: 6   HYDROcodone-acetaminophen (NORCO) 10-325 MG tablet, Take 1-2 tablets by mouth every 6 (six) hours as needed., Disp: 50 tablet, Rfl: 0   latanoprost (XALATAN) 0.005 % ophthalmic solution, Place 1 drop  into the right eye at bedtime., Disp: , Rfl:    metoprolol succinate (TOPROL-XL) 50 MG 24 hr tablet, Take with or immediately following a meal., Disp: 90 tablet, Rfl: 3   Multiple Vitamin (MULTIVITAMIN WITH MINERALS) TABS, Take 1 tablet by mouth daily., Disp: , Rfl:    Omega-3 Fatty Acids 1200 MG CAPS, Take 1,200 mg by mouth daily., Disp: , Rfl:    omeprazole (PRILOSEC) 40 MG capsule, Take 1 capsule (40 mg total) by mouth daily., Disp: 90 capsule, Rfl: 3   pravastatin (PRAVACHOL) 10 MG tablet, Take 1 tablet (10 mg total) by mouth every evening., Disp: 90 tablet, Rfl: 3   Probiotic Product (PROBIOTIC ADVANCED PO), Take by mouth daily., Disp: ,  Rfl:    sertraline (ZOLOFT) 50 MG tablet, TAKE ONE HALF TABLET BY MOUTH ONCE DAILY FOR 2 WEEKS AND THEN INCREASE TO ONE TABLET BY MOUTH DAILY., Disp: 90 tablet, Rfl: 2   spironolactone (ALDACTONE) 25 MG tablet, Take 0.5 tablets (12.5 mg total) by mouth daily., Disp: 45 tablet, Rfl: 3

## 2022-03-05 ENCOUNTER — Ambulatory Visit: Payer: PPO | Admitting: Pulmonary Disease

## 2022-03-05 ENCOUNTER — Encounter: Payer: Self-pay | Admitting: Pulmonary Disease

## 2022-03-07 NOTE — Addendum Note (Signed)
Addended by: Freda Jackson on: 03/07/2022 03:13 PM ? ? Modules accepted: Orders ? ?

## 2022-03-11 ENCOUNTER — Encounter: Payer: Self-pay | Admitting: Internal Medicine

## 2022-03-11 DIAGNOSIS — R0683 Snoring: Secondary | ICD-10-CM | POA: Diagnosis not present

## 2022-03-11 DIAGNOSIS — G4730 Sleep apnea, unspecified: Secondary | ICD-10-CM | POA: Diagnosis not present

## 2022-03-12 NOTE — Progress Notes (Deleted)
?Cardiology Office Note:   ? ?Date:  03/12/2022  ? ?ID:  Jessica Mcneil, DOB [birth year], MRN [id number] ? ?PCP:  Eulas Post, MD ?  ?[facility] Providers ?Cardiologist:  Fransico Him, MD { ?Click to update primary MD,subspecialty MD or APP then REFRESH:1}   ? ?Referring MD: Eulas Post, MD  ? ?No chief complaint on file. ?*** ? ?History of Present Illness:   ? ?Jessica Mcneil is a 81 y.o. female with a hx of chronic combined CHF, HTN, NICM with normal coronary arteries,hyperlipidemia, GERD, restrictive lung disease, and bronchiectasis. Former smoker for 20 years, quit smoking 40+ years ago. ? ?In February 2007, cardiac catheterization in 2007 with normal coronary arteries, EF 50%. LVEF 35-40% on echo. Echo 2008 EF 40-50% with mild diffuse LV hypokinesis and abnormal LV relaxation. Mild AI. Echo 07/2008 EF 55-60%, trivial AI. CPX 03/2006 pVO2: 20.6 (105% predicted) slope of 42.7, pRER 1.09, mild restriction on her spirometry. Echo 03/2011 EF 60-65%, grade 1 DD, mild MR/AI.  ?Most recent echo 10/2020 revealed LVEF 50-55%, G1 DD, right ventricular systolic function moderately reduced with RV systolic pressure 40.9 mmHg.  ? ?She was last seen in our office on 06/29/21 by Dr. Radford Pax at which time LDL was 161 and she was advised to switch from pravastatin to rosuvastatin, however there is no record that patient agreed. A  6 month follow-up was recommended.  ? ?Today, she is here  ? ?Past Medical History:  ?Diagnosis Date  ? Anxiety   ? Arthritis   ? CHF (congestive heart failure) ([place])   ? due to non ischemic cardiomyopathy  ? Depression   ? GERD (gastroesophageal reflux disease)   ? Headache(784.0)   ? migraines  ? HLD (hyperlipidemia)   ? HTN (hypertension)   ? Overweight(278.02)   ? Pneumonia   ? PONV (postoperative nausea and vomiting)   ? long time ago none recent  ? 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 Shortness of breath   ? ? ?Past Surgical History:  ?Procedure Laterality  Date  ? ANTERIOR AND POSTERIOR REPAIR  2003  ? bladder  ? APPENDECTOMY    ? BELPHAROPTOSIS REPAIR    ? bilat  ? BREAST BIOPSY Right   ? BREAST LUMPECTOMY WITH NEEDLE LOCALIZATION Right 02/12/2013  ? Procedure: BREAST LUMPECTOMY WITH NEEDLE LOCALIZATION;  Surgeon: Merrie Roof, MD;  [facility];  Service: General;  Laterality: Right;  ? CHOLECYSTECTOMY    ? COLONOSCOPY    ? EYE SURGERY    ? Glaucoma Implant  ? TONSILLECTOMY    ? TOTAL SHOULDER ARTHROPLASTY Right 12/09/2017  ? Procedure: REVERSE TOTAL SHOULDER ARTHROPLASTY;  Surgeon: Marchia Bond, MD;  [facility];  Service: Orthopedics;  Laterality: Right;  ? VAGINAL HYSTERECTOMY  1998  ? ? ?Current Medications: ?No outpatient medications have been marked as taking for the 03/13/22 encounter (Appointment) with Ann Maki, [name], NP.  ?  ? ?Allergies:   Lipitor [atorvastatin], Codeine, Erythromycin, and Sulfamethoxazole  ? ?Social History  ? ?Socioeconomic History  ? Marital status: Divorced  ?  Spouse name: Not on file  ? Number of children: Not on file  ? Years of education: Not on file  ? Highest education level: Not on file  ?Occupational History  ? Occupation: retired  ?Tobacco Use  ? Smoking status: Former  ?  Packs/day: 2.00  ?  Types: Cigarettes  ?  Quit date: 12/23/1982  ?  Years since quitting: 39.2  ?  Smokeless tobacco: Never  ?Vaping Use  ? Vaping Use: Never used  ?Substance and Sexual Activity  ? Alcohol use: No  ? Drug use: No  ? Sexual activity: Not on file  ?Other Topics Concern  ? Not on file  ?Social History Narrative  ? Not on file  ? ?Social Determinants of Health  ? ?Financial Resource Strain: Not on file  ?Food Insecurity: Not on file  ?Transportation Needs: Not on file  ?Physical Activity: Not on file  ?Stress: Not on file  ?Social Connections: Not on file  ?  ? ?Family History: ?The patient's ***family history includes Arthritis in an other family member; Heart disease in her father; Hyperlipidemia in an other  family member. There is no history of Breast cancer. ? ?ROS:   ?Please see the history of present illness.    ?*** All other systems reviewed and are negative. ? ?Labs/Other Studies Reviewed:   ? ?The following studies were reviewed today: ?*** ? ?Recent Labs: ?06/29/2021: ALT 15; BUN 12; Creatinine, Ser 0.94; Potassium 4.3; Sodium 140  ?Recent Lipid Panel ?   ?Component Value Date/Time  ? CHOL 239 (H) 06/29/2021 3709  ? TRIG 144 06/29/2021 0833  ? HDL 52 06/29/2021 0833  ? CHOLHDL 4.6 (H) 06/29/2021 6438  ? CHOLHDL 5 02/17/2019 1443  ? VLDL 34.2 02/17/2019 1443  ? [place] 161 (H) 06/29/2021 3818  ? LDLDIRECT 216.7 06/05/2011 1105  ? ? ? ?Risk Assessment/Calculations:   ?{Does this patient have ATRIAL FIBRILLATION?:[id number]} ? ?    ? ?Physical Exam:   ? ?VS:  There were no vitals taken for this visit.   ? ?Wt Readings from Last 3 Encounters:  ?03/04/22 163 lb 3.2 oz (74 kg)  ?06/29/21 164 lb 12.8 oz (74.8 kg)  ?06/20/21 167 lb 8 oz (76 kg)  ?  ? ?GEN: *** Well nourished, well developed in no acute distress ?HEENT: Normal ?NECK: No JVD; No carotid bruits ?CARDIAC: ***RRR, no murmurs, rubs, gallops ?RESPIRATORY:  Clear to auscultation without rales, wheezing or rhonchi  ?ABDOMEN: Soft, non-tender, non-distended ?MUSCULOSKELETAL:  No edema; No deformity. *** pedal pulses, ***bilaterally ?SKIN: Warm and dry ?NEUROLOGIC:  Alert and oriented x 3 ?PSYCHIATRIC:  Normal affect  ? ?EKG:  EKG is *** ordered today.  The ekg ordered today demonstrates *** ? ?Diagnoses:   ? ?No diagnosis found. ?Assessment and Plan:   ? ? ?*** ? ?   ? ?   ? ?{Are you ordering a CV Procedure (e.g. stress test, cath, DCCV, TEE, etc)?   Press F2        :[id number]}  ? ? ?Medication Adjustments/Labs and Tests Ordered: ?Current medicines are reviewed at length with the patient today.  Concerns regarding medicines are outlined above.  ?No orders of the defined types were placed in this encounter. ? ?No orders of the defined types were placed in this  encounter. ? ? ?There are no Patient Instructions on file for this visit.  ? ?Signed, ?Deonna Krummel, [name], NP  ?03/12/2022 2:57 PM    ?[place]  ? ?

## 2022-03-13 ENCOUNTER — Ambulatory Visit: Payer: PPO | Admitting: Nurse Practitioner

## 2022-03-16 ENCOUNTER — Other Ambulatory Visit: Payer: Self-pay | Admitting: Pulmonary Disease

## 2022-03-16 DIAGNOSIS — K219 Gastro-esophageal reflux disease without esophagitis: Secondary | ICD-10-CM

## 2022-03-18 ENCOUNTER — Telehealth: Payer: Self-pay | Admitting: Pulmonary Disease

## 2022-03-18 NOTE — Telephone Encounter (Signed)
ONO results: ? ?Please let patient know that she spent 36mn with an O2 saturation less than 88%. She does qualify for nocturnal oxygen. I recommend 2L of O2 with humidification to be used at night. Please place order if patient would like to move forward with therapy.  ? ?Thanks, ?JD ?

## 2022-03-20 DIAGNOSIS — H401111 Primary open-angle glaucoma, right eye, mild stage: Secondary | ICD-10-CM | POA: Diagnosis not present

## 2022-03-20 DIAGNOSIS — H401122 Primary open-angle glaucoma, left eye, moderate stage: Secondary | ICD-10-CM | POA: Diagnosis not present

## 2022-03-20 DIAGNOSIS — H2511 Age-related nuclear cataract, right eye: Secondary | ICD-10-CM | POA: Diagnosis not present

## 2022-03-20 DIAGNOSIS — H25811 Combined forms of age-related cataract, right eye: Secondary | ICD-10-CM | POA: Diagnosis not present

## 2022-03-26 ENCOUNTER — Telehealth: Payer: Self-pay | Admitting: Family Medicine

## 2022-03-26 NOTE — Telephone Encounter (Signed)
Left message for patient to call back and schedule Medicare Annual Wellness Visit (AWV) either virtually or in office. Left  my [name] number [phone] ? ? ?Last AWV ;12/27/20 ? please schedule at anytime with [facility] Nurse Health Advisor 1 or 2 ? ? ? ?

## 2022-04-02 NOTE — Telephone Encounter (Signed)
Left message for patient to call back  

## 2022-04-08 ENCOUNTER — Ambulatory Visit (INDEPENDENT_AMBULATORY_CARE_PROVIDER_SITE_OTHER)
Admission: RE | Admit: 2022-04-08 | Discharge: 2022-04-08 | Disposition: A | Discharge status: Home / Self Care | Payer: PPO | Source: Ambulatory Visit | Attending: Pulmonary Disease | Admitting: Pulmonary Disease

## 2022-04-08 DIAGNOSIS — J849 Interstitial pulmonary disease, unspecified: Secondary | ICD-10-CM | POA: Diagnosis not present

## 2022-04-08 DIAGNOSIS — I2510 Atherosclerotic heart disease of native coronary artery without angina pectoris: Secondary | ICD-10-CM | POA: Diagnosis not present

## 2022-04-08 DIAGNOSIS — J84112 Idiopathic pulmonary fibrosis: Secondary | ICD-10-CM | POA: Diagnosis not present

## 2022-04-08 DIAGNOSIS — J479 Bronchiectasis, uncomplicated: Secondary | ICD-10-CM | POA: Diagnosis not present

## 2022-04-08 DIAGNOSIS — K449 Diaphragmatic hernia without obstruction or gangrene: Secondary | ICD-10-CM | POA: Diagnosis not present

## 2022-04-10 NOTE — Telephone Encounter (Signed)
Sent to patient.

## 2022-04-17 ENCOUNTER — Ambulatory Visit (INDEPENDENT_AMBULATORY_CARE_PROVIDER_SITE_OTHER): Payer: PPO

## 2022-04-17 VITALS — Ht 67.0 in | Wt 163.0 lb

## 2022-04-17 DIAGNOSIS — Z0000 Encounter for general adult medical examination without abnormal findings: Secondary | ICD-10-CM

## 2022-04-17 NOTE — Patient Instructions (Addendum)
?Ms. Mcneil , ?Thank you for taking time to come for your Medicare Wellness Visit. I appreciate your ongoing commitment to your health goals. Please review the following plan we discussed and let me know if I can assist you in the future.  ? ?These are the goals we discussed: ? Goals   ? ?   Patient Stated   ?   Get off of some mediactions ?  ?   Weight (lb) < 200 lb (90.7 kg) (pt-stated)   ?   I would like to get down to about 140lbs ?  ? ?  ?  ?This is a list of the screening recommended for you and due dates:  ?Health Maintenance  ?Topic Date Due  ? Hepatitis C Screening: USPSTF Recommendation to screen - Ages 7-79 yo.  Never done  ? Zoster (Shingles) Vaccine (1 of 2) 07/17/2022*  ? Pneumonia Vaccine (3 - PPSV23 if available, else PCV20) 04/18/2023*  ? Flu Shot  07/23/2022  ? Tetanus Vaccine  02/12/2026  ? DEXA scan (bone density measurement)  Completed  ? COVID-19 Vaccine  Completed  ? HPV Vaccine  Aged Out  ?*Topic was postponed. The date shown is not the original due date.  ?  ?Opioid Pain Medicine Management ?Opioids are powerful medicines that are used to treat moderate to severe pain. When used for short periods of time, they can help you to: ?Sleep better. ?Do better in physical or occupational therapy. ?Feel better in the first few days after an injury. ?Recover from surgery. ?Opioids should be taken with the supervision of a trained health care provider. They should be taken for the shortest period of time possible. This is because opioids can be addictive, and the longer you take opioids, the greater your risk of addiction. This addiction can also be called opioid use disorder. ?What are the risks? ?Using opioid pain medicines for longer than 3 days increases your risk of side effects. Side effects include: ?Constipation. ?Nausea and vomiting. ?Breathing difficulties (respiratory depression). ?Drowsiness. ?Confusion. ?Opioid use disorder. ?Itching. ?Taking opioid pain medicine for a long period of  time can affect your ability to do daily tasks. It also puts you at risk for: ?Motor vehicle crashes. ?Depression. ?Suicide. ?Heart attack. ?Overdose, which can be life-threatening. ?What is a pain treatment plan? ?A pain treatment plan is an agreement between you and your health care provider. Pain is unique to each person, and treatments vary depending on your condition. To manage your pain, you and your health care provider need to work together. To help you do this: ?Discuss the goals of your treatment, including how much pain you might expect to have and how you will manage the pain. ?Review the risks and benefits of taking opioid medicines. ?Remember that a good treatment plan uses more than one approach and minimizes the chance of side effects. ?Be honest about the amount of medicines you take and about any drug or alcohol use. ?Get pain medicine prescriptions from only one health care provider. ?Pain can be managed with many types of alternative treatments. Ask your health care provider to refer you to one or more specialists who can help you manage pain through: ?Physical or occupational therapy. ?Counseling (cognitive behavioral therapy). ?Good nutrition. ?Biofeedback. ?Massage. ?Meditation. ?Non-opioid medicine. ?Following a gentle exercise program. ?How to use opioid pain medicine ?Taking medicine ?Take your pain medicine exactly as told by your health care provider. Take it only when you need it. ?If your pain gets less severe, you  may take less than your prescribed dose if your health care provider approves. ?If you are not having pain, do nottake pain medicine unless your health care provider tells you to take it. ?If your pain is severe, do nottry to treat it yourself by taking more pills than instructed on your prescription. Contact your health care provider for help. ?Write down the times when you take your pain medicine. It is easy to become confused while on pain medicine. Writing the time can  help you avoid overdose. ?Take other over-the-counter or prescription medicines only as told by your health care provider. ?Keeping yourself and others safe ? ?While you are taking opioid pain medicine: ?Do not drive, use machinery, or power tools. ?Do not sign legal documents. ?Do not drink alcohol. ?Do not take sleeping pills. ?Do not supervise children by yourself. ?Do not do activities that require climbing or being in high places. ?Do not go to a lake, river, ocean, spa, or swimming pool. ?Do not share your pain medicine with anyone. ?Keep pain medicine in a locked cabinet or in a secure area where pets and children cannot reach it. ?Stopping your use of opioids ?If you have been taking opioid medicine for more than a few weeks, you may need to slowly decrease (taper) how much you take until you stop completely. Tapering your use of opioids can decrease your risk of symptoms of withdrawal, such as: ?Pain and cramping in the abdomen. ?Nausea. ?Sweating. ?Sleepiness. ?Restlessness. ?Uncontrollable shaking (tremors). ?Cravings for the medicine. ?Do not attempt to taper your use of opioids on your own. Talk with your health care provider about how to do this. Your health care provider may prescribe a step-down schedule based on how much medicine you are taking and how long you have been taking it. ?Getting rid of leftover pills ?Do not save any leftover pills. Get rid of leftover pills safely by: ?Taking the medicine to a prescription take-back program. This is usually offered by the county or law enforcement. ?Bringing them to a pharmacy that has a drug disposal container. ?Flushing them down the toilet. Check the label or package insert of your medicine to see whether this is safe to do. ?Throwing them out in the trash. Check the label or package insert of your medicine to see whether this is safe to do. ?If it is safe to throw it out, remove the medicine from the original container, put it into a sealable bag or  container, and mix it with used coffee grounds, food scraps, dirt, or cat litter before putting it in the trash. ?Follow these instructions at home: ?Activity ?Do exercises as told by your health care provider. ?Avoid activities that make your pain worse. ?Return to your normal activities as told by your health care provider. Ask your health care provider what activities are safe for you. ?General instructions ?You may need to take these actions to prevent or treat constipation: ?Drink enough fluid to keep your urine pale yellow. ?Take over-the-counter or prescription medicines. ?Eat foods that are high in fiber, such as beans, whole grains, and fresh fruits and vegetables. ?Limit foods that are high in fat and processed sugars, such as fried or sweet foods. ?Keep all follow-up visits. This is important. ?Where to find support ?If you have been taking opioids for a long time, you may benefit from receiving support for quitting from a local support group or counselor. Ask your health care provider for a referral to these resources in your area. ?Where  to find more information ?Centers for Disease Control and Prevention (CDC): http://www.wolf.info/ ?[place] Food and Drug Administration (FDA): [place].[place] ?Get help right away if: ?You may have taken too much of an opioid (overdosed). Common symptoms of an overdose: ?Your breathing is slower or more shallow than normal. ?You have a very slow heartbeat (pulse). ?You have slurred speech. ?You have nausea and vomiting. ?Your pupils become very small. ?You have other potential symptoms: ?You are very confused. ?You faint or feel like you will faint. ?You have cold, clammy skin. ?You have blue lips or fingernails. ?You have thoughts of harming yourself or harming others. ?These symptoms may represent a serious problem that is an emergency. Do not wait to see if the symptoms will go away. Get medical help right away. Call your local emergency services (911 in the [place] Do not drive  yourself to the hospital.  ?If you ever feel like you may hurt yourself or others, or have thoughts about taking your own life, get help right away. Go to your nearest emergency department or: ?Call your loca

## 2022-04-17 NOTE — Progress Notes (Signed)
? ?Subjective:  ? Jessica Mcneil is a 81 y.o. female who presents for Medicare Annual (Subsequent) preventive examination. ? ?Review of Systems    ?Virtual Visit via Telephone Note ? ?I connected with  [name] on 04/17/22 at  2:00 PM EDT by telephone and verified that I am speaking with the correct person using two identifiers. ? ?Location: ?Patient: Home ?Provider: Office ?Persons participating in the virtual visit: patient/Nurse Health Advisor ?  ?I discussed the limitations, risks, security and privacy concerns of performing an evaluation and management service by telephone and the availability of in person appointments. The patient expressed understanding and agreed to proceed. ? ?Interactive audio and video telecommunications were attempted between this nurse and patient, however failed, due to patient having technical difficulties OR patient did not have access to video capability.  We continued and completed visit with audio only. ? ?Some vital signs may be absent or patient reported.  ? ?[name], LPN  ?Cardiac Risk Factors include: advanced age (>75mn, >>53women);hypertension ? ?   ?Objective:  ?  ?Today's Vitals  ? 04/17/22 1358  ?Weight: 163 lb (73.9 kg)  ?Height: '5\' 7"'$  (1.702 m)  ? ?Body mass index is 25.53 kg/m?. ? ? ?  04/17/2022  ?  2:11 PM 12/27/2020  ?  3:25 PM 12/04/2017  ? 11:29 AM 12/29/2014  ?  2:23 PM 12/29/2014  ?  2:08 PM 02/01/2013  ? 10:22 AM  ?Advanced Directives  ?Does Patient Have a Medical Advance Directive? Yes Yes Yes No No Patient has advance directive, copy not in chart  ?Type of [place]   Living will;Healthcare Power of Attorney  ?Does patient want to make changes to medical advance directive? No - Patient declined  No - Patient declined     ?Copy of [place] Chart? No - copy requested No - copy  requested Yes     ?Would patient like information on creating a medical advance directive?    No - patient declined information No - patient declined information   ? ? ?Current Medications (verified) ?Outpatient Encounter Medications as of 04/17/2022  ?Medication Sig  ? acetaminophen (TYLENOL) 500 MG tablet Take 500 mg by mouth. Patient reports taking 1 to 2 tablets a day if needed.  ? albuterol (VENTOLIN HFA) 108 (90 Base) MCG/ACT inhaler Inhale 2 puffs into the lungs every 6 (six) hours as needed for wheezing or shortness of breath.  ? allopurinol (ZYLOPRIM) 300 MG tablet TAKE 1 TABLET BY MOUTH EVERY DAY  ? ALPRAZolam (XANAX) 0.5 MG tablet TAKE 1 TABLET BY MOUTH AT  BEDTIME AS NEEDED FOR  SLEEP/ANXIETY  ? aspirin EC 81 MG tablet Take 81 mg by mouth daily.  ? CALCIUM-MAGNESIUM-ZINC PO Take 1 tablet by mouth daily.  ? docusate sodium (COLACE) 100 MG capsule Take 200 mg by mouth at bedtime.  ? Fluticasone-Umeclidin-Vilant (TRELEGY ELLIPTA) 100-62.5-25 MCG/ACT AEPB Inhale 1 puff into the lungs daily.  ? HYDROcodone-acetaminophen (NORCO) 10-325 MG tablet Take 1-2 tablets by mouth every 6 (six) hours as needed.  ? latanoprost (XALATAN) 0.005 % ophthalmic solution Place 1 drop into the right eye at bedtime.  ? metoprolol succinate (TOPROL-XL) 50 MG 24 hr tablet Take with or immediately following a meal.  ? Multiple Vitamin (MULTIVITAMIN WITH MINERALS) TABS Take 1 tablet by mouth daily.  ? Omega-3 Fatty Acids 1200 MG CAPS Take 1,200 mg  by mouth daily.  ? omeprazole (PRILOSEC) 40 MG capsule TAKE 1 CAPSULE (40 MG TOTAL) BY MOUTH DAILY.  ? pravastatin (PRAVACHOL) 10 MG tablet Take 1 tablet (10 mg total) by mouth every evening.  ? Probiotic Product (PROBIOTIC ADVANCED PO) Take by mouth daily.  ? sertraline (ZOLOFT) 50 MG tablet TAKE ONE HALF TABLET BY MOUTH ONCE DAILY FOR 2 WEEKS AND THEN INCREASE TO ONE TABLET BY MOUTH DAILY.  ? spironolactone (ALDACTONE) 25 MG tablet Take 0.5 tablets (12.5 mg total) by mouth daily.  ? ?No  facility-administered encounter medications on file as of 04/17/2022.  ? ? ?Allergies (verified) ?Lipitor [atorvastatin], Codeine, Erythromycin, and Sulfamethoxazole  ? ?History: ?Past Medical History:  ?Diagnosis Date  ? Anxiety   ? Arthritis   ? CHF (congestive heart failure) ([place])   ? due to non ischemic cardiomyopathy  ? Depression   ? GERD (gastroesophageal reflux disease)   ? Headache(784.0)   ? migraines  ? HLD (hyperlipidemia)   ? HTN (hypertension)   ? Overweight(278.02)   ? Pneumonia   ? PONV (postoperative nausea and vomiting)   ? long time ago none recent  ? 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 Shortness of breath   ? ?Past Surgical History:  ?Procedure Laterality Date  ? ANTERIOR AND POSTERIOR REPAIR  2003  ? bladder  ? APPENDECTOMY    ? BELPHAROPTOSIS REPAIR    ? bilat  ? BREAST BIOPSY Right   ? BREAST LUMPECTOMY WITH NEEDLE LOCALIZATION Right 02/12/2013  ? Procedure: BREAST LUMPECTOMY WITH NEEDLE LOCALIZATION;  Surgeon: Merrie Roof, MD;  Location: [facility];  Service: General;  Laterality: Right;  ? CHOLECYSTECTOMY    ? COLONOSCOPY    ? EYE SURGERY    ? Glaucoma Implant  ? TONSILLECTOMY    ? TOTAL SHOULDER ARTHROPLASTY Right 12/09/2017  ? Procedure: REVERSE TOTAL SHOULDER ARTHROPLASTY;  Surgeon: Marchia Bond, MD;  Location: [facility];  Service: Orthopedics;  Laterality: Right;  ? VAGINAL HYSTERECTOMY  1998  ? ?Family History  ?Problem Relation Age of Onset  ? Heart disease Father   ? Arthritis Other   ? Hyperlipidemia Other   ? Breast cancer Neg Hx   ? ?Social History  ? ?Socioeconomic History  ? Marital status: Divorced  ?  Spouse name: Not on file  ? Number of children: Not on file  ? Years of education: Not on file  ? Highest education level: Not on file  ?Occupational History  ? Occupation: retired  ?Tobacco Use  ? Smoking status: Former  ?  Packs/day: 2.00  ?  Types: Cigarettes  ?  Quit date: 12/23/1982  ?  Years since quitting: 39.3  ? Smokeless tobacco: Never  ?Vaping  Use  ? Vaping Use: Never used  ?Substance and Sexual Activity  ? Alcohol use: No  ? Drug use: No  ? Sexual activity: Not on file  ?Other Topics Concern  ? Not on file  ?Social History Narrative  ? Not on file  ? ?Social Determinants of Health  ? ?Financial Resource Strain: Low Risk   ? Difficulty of Paying Living Expenses: Not hard at all  ?Food Insecurity: No Food Insecurity  ? Worried About Charity fundraiser in the Last Year: Never true  ? Ran Out of Food in the Last Year: Never true  ?Transportation Needs: No Transportation Needs  ? Lack of Transportation (Medical): No  ? Lack of Transportation (Non-Medical): No  ?Physical Activity: Sufficiently Active  ?  Days of Exercise per Week: 7 days  ? Minutes of Exercise per Session: 30 min  ?Stress: No Stress Concern Present  ? Feeling of Stress : Not at all  ?Social Connections: Moderately Isolated  ? Frequency of Communication with Friends and Family: More than three times a week  ? Frequency of Social Gatherings with Friends and Family: More than three times a week  ? Attends Religious Services: Never  ? Active Member of Clubs or Organizations: Yes  ? Attends Archivist Meetings: More than 4 times per year  ? Marital Status: Divorced  ? ? ?Tobacco Counseling ?Counseling given: Not Answered ? ? ?Clinical Intake: ? ? ?Diabetic?  No ? ?Interpreter Needed?: NoActivities of Daily Living ? ?  04/17/2022  ?  2:07 PM  ?In your present state of health, do you have any difficulty performing the following activities:  ?Hearing? 0  ?Vision? 0  ?Difficulty concentrating or making decisions? 0  ?Walking or climbing stairs? 0  ?Dressing or bathing? 0  ?Doing errands, shopping? 0  ?Preparing Food and eating ? N  ?Using the Toilet? N  ?In the past six months, have you accidently leaked urine? Y  ?Comment Wears breifs. Followed by PCP  ?Do you have problems with loss of bowel control? N  ?Managing your Medications? N  ?Managing your Finances? N  ?Housekeeping or managing your  Housekeeping? N  ? ? ?Patient Care Team: ?Eulas Post, MD as PCP - General (Family Medicine) ?Sueanne Margarita, MD as PCP - Cardiology (Cardiology) ?Viona Gilmore, [facility] as Pharmacist (Pharmacist) ?

## 2022-04-19 ENCOUNTER — Telehealth: Payer: Self-pay | Admitting: Pulmonary Disease

## 2022-04-19 NOTE — Telephone Encounter (Signed)
Called patient and she would like the ONO results that she had last month. I advised patient that I would message Dr Erin Fulling and get those results for her.  ? ?ONO was scanned in on 04/08/22.  ? ?Dr Erin Fulling please advise!  ?

## 2022-04-25 ENCOUNTER — Ambulatory Visit: Payer: PPO | Admitting: Nurse Practitioner

## 2022-04-29 NOTE — Progress Notes (Addendum)
?Cardiology Office Note:   ? ?Date:  05/01/2022  ? ?ID:  Jessica Mcneil, DOB 1941/03/28, MRN [id number] ? ?PCP:  Eulas Post, MD ?  ?[facility] Providers ?Cardiologist:  Fransico Him, MD    ? ?Referring MD: Eulas Post, MD  ? ?Chief Complaint: follow-up chronic combined systolic and diastolic heart failure ? ?History of Present Illness:   ? ?Jessica Mcneil is a very pleasant 81 y.o. female with a hx of NICM with normal coronary arteries, chronic combined systolic and diastolic heart failure, hyperlipidemia, restrictive lung disease and bronchiectasis. ? ?CHF due to NICM: ?February 19, 2006 cardiac catheterization revealing normal coronary arteries with an EF of 50%.  EF 35 to 40% by echo.   ?CPX 03/2006 pVO2: 20.6 (105% predicted), slope of 42.7, pRER 1.09, mild restriction on spirometry ?Echo 2008 EF 40 to 50% with mild diffuse LV hypokinesis and abnormal left ventricular relaxation, mild AI. ?Echo 07/2008 EF 55 to 60%, trivial AI ?Echo 4/12 EF 60-65%, G1 DD, mild MR/AI ?Echo 06/2014 EF 45-50%, G1 DD, mild MR/AI ?Echo 08/2015 EF 45-50%, G1 DD, trivial AI ?Echo 11/2016 EF 45-50%, G1 DD, mild AI ?Echo 09/2019 EF 45-50%, impaired diastolic filling, mild TR/AI ?Echo 10/2020 EF 50-55%, G1 DD, mild AI ? ?[facility]ed [facility] until 08/2015 when she was referred to general cardiology. She has been followed by Dr. Meda Coffee and then Dr. Radford Pax.  ? ?She was last seen in our office 06/29/2021 by Dr. Radford Pax at which time LDL was elevated on pravastatin.  Patient was advised to start rosuvastatin, however there is no record of prescription being for field.  She was advised to return for 73-monthfollow-up. ? ?Today, she is here alone. She reports she is feeling well, however she continues to have dyspnea on exertion. Gets "very winded" walking for a long distance. She feels this is stable.  Seeing pulmonary medicine for management of what she describes as lung disease. Is active doing house work and  walking her dog. Sleeps with HOB elevated, but denies recent worsening of orthopnea, no PND or edema. She denies chest pain, lower extremity edema, fatigue, palpitations, melena, hematuria, hemoptysis, diaphoresis, weakness, presyncope, or syncope. Admits that she eats too much sugar, eats more when she is stressed. History of statin intolerance, does not want to try a different statin. Her sister is on Vascepa and it has really helped her, patient thinks she might tolerate this better than a different statin.  ? ?Past Medical History:  ?Diagnosis Date  ? Anxiety   ? Arthritis   ? [place]   ? due to non ischemic cardiomyopathy  ? Depression   ? GERD (gastroesophageal reflux disease)   ? Headache(784.0)   ? migraines  ? HLD (hyperlipidemia)   ? HTN (hypertension)   ? Overweight(278.02)   ? Pneumonia   ? PONV (postoperative nausea and vomiting)   ? long time ago none recent  ? 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 Shortness of breath   ? ? ?Past Surgical History:  ?Procedure Laterality Date  ? ANTERIOR AND POSTERIOR REPAIR  2003  ? bladder  ? APPENDECTOMY    ? BELPHAROPTOSIS REPAIR    ? bilat  ? BREAST BIOPSY Right   ? BREAST LUMPECTOMY WITH NEEDLE LOCALIZATION Right 02/12/2013  ? Procedure: BREAST LUMPECTOMY WITH NEEDLE LOCALIZATION;  Surgeon: PMerrie Roof MD;  [facility] [facility]  Service: General;  Laterality: Right;  ? CHOLECYSTECTOMY    ?  COLONOSCOPY    ? EYE SURGERY    ? Glaucoma Implant  ? TONSILLECTOMY    ? TOTAL SHOULDER ARTHROPLASTY Right 12/09/2017  ? Procedure: REVERSE TOTAL SHOULDER ARTHROPLASTY;  Surgeon: Marchia Bond, MD;  Location: [facility];  Service: Orthopedics;  Laterality: Right;  ? VAGINAL HYSTERECTOMY  1998  ? ? ?Current Medications: ?Current Meds  ?Medication Sig  ? acetaminophen (TYLENOL) 500 MG tablet Take 500 mg by mouth. Patient reports taking 1 to 2 tablets a day if needed.  ? albuterol (VENTOLIN HFA) 108 (90 Base) MCG/ACT inhaler  Inhale 2 puffs into the lungs every 6 (six) hours as needed for wheezing or shortness of breath.  ? allopurinol (ZYLOPRIM) 300 MG tablet TAKE 1 TABLET BY MOUTH EVERY DAY  ? ALPRAZolam (XANAX) 0.5 MG tablet TAKE 1 TABLET BY MOUTH AT  BEDTIME AS NEEDED FOR  SLEEP/ANXIETY  ? aspirin EC 81 MG tablet Take 81 mg by mouth daily.  ? CALCIUM-MAGNESIUM-ZINC PO Take 1 tablet by mouth daily.  ? docusate sodium (COLACE) 100 MG capsule Take 200 mg by mouth at bedtime.  ? Fluticasone-Umeclidin-Vilant (TRELEGY ELLIPTA) 100-62.5-25 MCG/ACT AEPB Inhale 1 puff into the lungs daily.  ? HYDROcodone-acetaminophen (NORCO) 10-325 MG tablet Take 1-2 tablets by mouth every 6 (six) hours as needed.  ? latanoprost (XALATAN) 0.005 % ophthalmic solution Place 1 drop into the right eye at bedtime.  ? metoprolol succinate (TOPROL-XL) 50 MG 24 hr tablet Take with or immediately following a meal.  ? Multiple Vitamin (MULTIVITAMIN WITH MINERALS) TABS Take 1 tablet by mouth daily.  ? Omega-3 Fatty Acids 1200 MG CAPS Take 1,200 mg by mouth daily.  ? omeprazole (PRILOSEC) 40 MG capsule TAKE 1 CAPSULE (40 MG TOTAL) BY MOUTH DAILY.  ? pravastatin (PRAVACHOL) 10 MG tablet Take 1 tablet (10 mg total) by mouth every evening.  ? Probiotic Product (PROBIOTIC ADVANCED PO) Take by mouth daily.  ? sertraline (ZOLOFT) 50 MG tablet TAKE ONE HALF TABLET BY MOUTH ONCE DAILY FOR 2 WEEKS AND THEN INCREASE TO ONE TABLET BY MOUTH DAILY.  ? spironolactone (ALDACTONE) 25 MG tablet Take 0.5 tablets (12.5 mg total) by mouth daily.  ?  ? ?Allergies:   Lipitor [atorvastatin], Codeine, Erythromycin, and Sulfamethoxazole  ? ?Social History  ? ?Socioeconomic History  ? Marital status: Divorced  ?  Spouse name: Not on file  ? Number of children: Not on file  ? Years of education: Not on file  ? Highest education level: Not on file  ?Occupational History  ? Occupation: retired  ?Tobacco Use  ? Smoking status: Former  ?  Packs/day: 2.00  ?  Types: Cigarettes  ?  Quit date: 12/23/1982   ?  Years since quitting: 39.3  ? Smokeless tobacco: Never  ?Vaping Use  ? Vaping Use: Never used  ?Substance and Sexual Activity  ? Alcohol use: No  ? Drug use: No  ? Sexual activity: Not on file  ?Other Topics Concern  ? Not on file  ?Social History Narrative  ? Not on file  ? ?Social Determinants of Health  ? ?Financial Resource Strain: Low Risk   ? Difficulty of Paying Living Expenses: Not hard at all  ?Food Insecurity: No Food Insecurity  ? Worried About Charity fundraiser in the Last Year: Never true  ? Ran Out of Food in the Last Year: Never true  ?Transportation Needs: No Transportation Needs  ? Lack of Transportation (Medical): No  ? Lack of Transportation (Non-Medical): No  ?Physical Activity:  Sufficiently Active  ? Days of Exercise per Week: 7 days  ? Minutes of Exercise per Session: 30 min  ?Stress: No Stress Concern Present  ? Feeling of Stress : Not at all  ?Social Connections: Moderately Isolated  ? Frequency of Communication with Friends and Family: More than three times a week  ? Frequency of Social Gatherings with Friends and Family: More than three times a week  ? Attends Religious Services: Never  ? Active Member of Clubs or Organizations: Yes  ? Attends Archivist Meetings: More than 4 times per year  ? Marital Status: Divorced  ?  ? ?Family History: ?The patient's family history includes Arthritis in an other family member; Heart disease in her father; Hyperlipidemia in an other family member. There is no history of Breast cancer. ? ?ROS:   ?Please see the history of present illness.   ?+ dyspnea on exertion  ?All other systems reviewed and are negative. ? ?Labs/Other Studies Reviewed:   ? ?The following studies were reviewed today: ? ?February 19, 2006 cardiac catheterization revealing normal coronary arteries with an EF of 50%.  EF 35 to 40% by echo.   ?CPX 03/2006 pVO2: 20.6 (105% predicted), slope of 42.7, pRER 1.09, mild restriction on spirometry ?Echo 2008 EF 40 to 50% with mild  diffuse LV hypokinesis and abnormal left ventricular relaxation, mild AI. ?Echo 07/2008 EF 55 to 60%, trivial AI ?Echo 4/12 EF 60-65%, G1 DD, mild MR/AI ?Echo 06/2014 EF 45-50%, G1 DD, mild MR/AI ?Echo 9

## 2022-05-01 ENCOUNTER — Ambulatory Visit: Payer: PPO | Admitting: Nurse Practitioner

## 2022-05-01 ENCOUNTER — Encounter: Payer: Self-pay | Admitting: Nurse Practitioner

## 2022-05-01 VITALS — BP 110/80 | HR 69 | Ht 67.0 in | Wt 163.2 lb

## 2022-05-01 DIAGNOSIS — I10 Essential (primary) hypertension: Secondary | ICD-10-CM | POA: Diagnosis not present

## 2022-05-01 DIAGNOSIS — I4510 Unspecified right bundle-branch block: Secondary | ICD-10-CM | POA: Diagnosis not present

## 2022-05-01 DIAGNOSIS — I428 Other cardiomyopathies: Secondary | ICD-10-CM

## 2022-05-01 DIAGNOSIS — E785 Hyperlipidemia, unspecified: Secondary | ICD-10-CM

## 2022-05-01 DIAGNOSIS — R0602 Shortness of breath: Secondary | ICD-10-CM | POA: Diagnosis not present

## 2022-05-01 DIAGNOSIS — I5022 Chronic systolic (congestive) heart failure: Secondary | ICD-10-CM

## 2022-05-01 LAB — COMPREHENSIVE METABOLIC PANEL
ALT: 15 IU/L (ref 0–32)
AST: 19 IU/L (ref 0–40)
Albumin/Globulin Ratio: 1.8 (ref 1.2–2.2)
Albumin: 4.4 g/dL (ref 3.7–4.7)
Alkaline Phosphatase: 82 IU/L (ref 44–121)
BUN/Creatinine Ratio: 14 (ref 12–28)
BUN: 12 mg/dL (ref 8–27)
Bilirubin Total: 0.3 mg/dL (ref 0.0–1.2)
CO2: 26 mmol/L (ref 20–29)
Calcium: 9.1 mg/dL (ref 8.7–10.3)
Chloride: 100 mmol/L (ref 96–106)
Creatinine, Ser: 0.85 mg/dL (ref 0.57–1.00)
Globulin, Total: 2.5 g/dL (ref 1.5–4.5)
Glucose: 109 mg/dL — ABNORMAL HIGH (ref 70–99)
Potassium: 4.6 mmol/L (ref 3.5–5.2)
Sodium: 139 mmol/L (ref 134–144)
Total Protein: 6.9 g/dL (ref 6.0–8.5)
eGFR: 70 mL/min/{1.73_m2} (ref >59)

## 2022-05-01 LAB — LIPID PANEL
Chol/HDL Ratio: 3.7 ratio (ref 0.0–4.4)
Cholesterol, Total: 200 mg/dL — ABNORMAL HIGH (ref 100–199)
HDL: 54 mg/dL (ref >39)
LDL Chol Calc (NIH): 127 mg/dL — ABNORMAL HIGH (ref 0–99)
Triglycerides: 106 mg/dL (ref 0–149)
VLDL Cholesterol Cal: 19 mg/dL (ref 5–40)

## 2022-05-01 NOTE — Patient Instructions (Signed)
Medication Instructions:  ? ?Your physician recommends that you continue on your current medications as directed. Please refer to the Current Medication list given to you today. ? ? ?*If you need a refill on your cardiac medications before your next appointment, please call your pharmacy* ? ? ?Lab Work: ? ?TODAY!!!!  CMET/LIPID ? ?If you have labs (blood work) drawn today and your tests are completely normal, you will receive your results only by: ?MyChart Message (if you have MyChart) OR ?A paper copy in the mail ?If you have any lab test that is abnormal or we need to change your treatment, we will call you to review the results. ? ? ?Testing/Procedures: ? ?None ordered. ? ? ?Follow-Up: ?At [facility], you and your health needs are our priority.  As part of our continuing mission to provide you with exceptional heart care, we have created designated Provider Care Teams.  These Care Teams include your primary Cardiologist (physician) and Advanced Practice Providers (APPs -  Physician Assistants and Nurse Practitioners) who all work together to provide you with the care you need, when you need it. ? ?We recommend signing up for the patient portal called "MyChart".  Sign up information is provided on this After Visit Summary.  MyChart is used to connect with patients for Virtual Visits (Telemedicine).  Patients are able to view lab/test results, encounter notes, upcoming appointments, etc.  Non-urgent messages can be sent to your provider as well.   ?To learn more about what you can do with MyChart, go to [email or web address].   ? ?Your next appointment:   ?1 year(s) ? ?The format for your next appointment:   ?In Person ? ?Provider:   ?Fransico Him, MD   ? ? ?Other Instructions ? ?Your physician wants you to follow-up in: 1 year with Dr.Turner.  You will receive a reminder letter in the mail two months in advance. If you don't receive a letter, please call our office to schedule the follow-up  appointment. ? ? ?Important Information About Sugar ? ? ? ? ?  ?

## 2022-05-03 ENCOUNTER — Other Ambulatory Visit: Payer: Self-pay | Admitting: Family Medicine

## 2022-05-06 ENCOUNTER — Ambulatory Visit: Payer: PPO | Admitting: Pulmonary Disease

## 2022-05-06 ENCOUNTER — Encounter: Payer: Self-pay | Admitting: Pulmonary Disease

## 2022-05-06 VITALS — BP 116/70 | HR 77 | Ht 67.0 in | Wt 162.0 lb

## 2022-05-06 DIAGNOSIS — J849 Interstitial pulmonary disease, unspecified: Secondary | ICD-10-CM

## 2022-05-06 DIAGNOSIS — G4734 Idiopathic sleep related nonobstructive alveolar hypoventilation: Secondary | ICD-10-CM | POA: Diagnosis not present

## 2022-05-06 NOTE — Patient Instructions (Signed)
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 ?The 2 possible drugs for pulmonary fibrosis include nintedanib or pirfenidone. ? ?We will schedule you for 6 minute walk test and pulmonary function tests over the next month for monitoring purposes. ? ?If the breathing tests show a significant decline from last year then we will discuss treatment at that time. ? ?I will discuss your case at our monthly Interstitial Lung Disease Conference.  ? ?Follow up in 6 months ?

## 2022-05-06 NOTE — Progress Notes (Signed)
Synopsis: Referred in 12/2020 for shortness of breath by Ermalinda Barrios, PA  Subjective:   PATIENT ID: Jessica Mcneil GENDER: female DOB: 1941-12-12, MRN: [id number]  HPI  Chief Complaint  Patient presents with   Follow-up    6 wk f/u    Jessica Mcneil is a 81 year old woman, former smoker with nonischemic cardiomyopathy, hypertension, hyperlipidemia and GERD who returns to pulmonary clinic for interstitial lung disease.   She was started on 2L of supplemental O2 at night since last visit based on ONO.   HRCT Chest 04/04/22 showed basilar predominant fibrotic interstitial lung disease. No evidence of progression when compared to 03/15/21 scan. 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   OV 03/04/22 She has increasing shortness of breath since last visit and has noticed she is not able to walk her dogs as far. Her GERD is better with PPI therapy and elevating head of bed.   She is using trelegy ellipta daily and albuterol a couple times per day. She finds more relief with rest for her dyspnea.   She does report hand stiffness bilaterally in the morning time and throughout the day. She denies any skin rashes.   No history of autoimmune conditions in the family.  ANA, anti-CCP, rheumatoid factor were negative 05/09/2021.  OV 03/08/21 She had PFTs done on 01/25/21 and she has moderate restrictive defect and mild diffusion defect.   High resolution CT chest on 03/15/21 shows mild patchy confluent subpleural reticulation and ground glass opacities in both lungs with mild traction bronchiectasis.   She has benefited from daily Spiriva use and as needed albuterol use. She does report coughing up mucous after using Spiriva in the mornings.    She continues to have frequent GERD symptoms despite being on '20mg'$  of omeprazole a day. She has a small hiatal hernia noted on CT chest.   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   Shortness of breath      Family History  Problem Relation Age of Onset   Heart disease Father    Arthritis Other    Hyperlipidemia Other    Breast cancer Neg Hx      Social History   Socioeconomic History   Marital status: Divorced    Spouse name: Not on file   Number of children: Not on file   Years of education: Not on file   Highest education level: Not on file  Occupational History   Occupation: retired  Tobacco Use   Smoking status: Former    Packs/day: 2.00    Types: Cigarettes    Quit date: 12/23/1982    Years since quitting: 39.3   Smokeless tobacco: Never  Vaping Use   Vaping Use: Never used  Substance and Sexual Activity   Alcohol use: No   Drug use: No   Sexual activity: Not on file  Other Topics Concern   Not on file  Social History Narrative   Not on file   Social Determinants of Health   Financial  Resource Strain: Low Risk    Difficulty of Paying Living Expenses: Not hard at all  Food Insecurity: No Food Insecurity   Worried About Charity fundraiser in the Last Year: Never true   Ran Out of Food in the Last Year: Never true  Transportation Needs: No Transportation Needs   Lack of Transportation (Medical): No   Lack of Transportation (Non-Medical): No  Physical Activity: Sufficiently Active   Days of Exercise per Week: 7 days   Minutes of Exercise per Session: 30 min  Stress: No Stress Concern Present   Feeling of Stress : Not at all  Social Connections: Moderately Isolated   Frequency of Communication with Friends and Family: More than three times a week   Frequency of Social Gatherings with Friends and  Family: More than three times a week   Attends Religious Services: Never   Marine scientist or Organizations: Yes   Attends Music therapist: More than 4 times per year   Marital Status: Divorced  Human resources officer Violence: Not At Risk   Fear of Current or Ex-Partner: No   Emotionally Abused: No   Physically Abused: No   Sexually Abused: No     Allergies  Allergen Reactions   Lipitor [Atorvastatin]     UNSPECIFIED REACTION    Codeine Itching   Erythromycin Nausea And Vomiting    GI UPSET   Sulfamethoxazole Itching    Over 40 years ago     Outpatient Medications Prior to Visit  Medication Sig Dispense Refill   acetaminophen (TYLENOL) 500 MG tablet Take 500 mg by mouth. Patient reports taking 1 to 2 tablets a day if needed.     albuterol (VENTOLIN HFA) 108 (90 Base) MCG/ACT inhaler Inhale 2 puffs into the lungs every 6 (six) hours as needed for wheezing or shortness of breath. 8 g 6   allopurinol (ZYLOPRIM) 300 MG tablet TAKE 1 TABLET BY MOUTH EVERY DAY 90 tablet 0   ALPRAZolam (XANAX) 0.5 MG tablet TAKE 1 TABLET BY MOUTH AT  BEDTIME AS NEEDED FOR  SLEEP/ANXIETY 90 tablet 0   aspirin EC 81 MG tablet Take 81 mg by mouth daily.     CALCIUM-MAGNESIUM-ZINC PO Take 1 tablet by mouth daily.     docusate sodium (COLACE) 100 MG capsule Take 200 mg by mouth at bedtime.     Fluticasone-Umeclidin-Vilant (TRELEGY ELLIPTA) 100-62.5-25 MCG/ACT AEPB Inhale 1 puff into the lungs daily. 60 each 6   HYDROcodone-acetaminophen (NORCO) 10-325 MG tablet Take 1-2 tablets by mouth every 6 (six) hours as needed. 50 tablet 0   latanoprost (XALATAN) 0.005 % ophthalmic solution Place 1 drop into the right eye at bedtime.     metoprolol succinate (TOPROL-XL) 50 MG 24 hr tablet Take with or immediately following a meal. 90 tablet 3   Omega-3 Fatty Acids 1200 MG CAPS Take 1,200 mg by mouth daily.     omeprazole (PRILOSEC) 40 MG capsule TAKE 1 CAPSULE (40 MG TOTAL) BY MOUTH DAILY. 90 capsule 1    pravastatin (PRAVACHOL) 10 MG tablet Take 1 tablet (10 mg total) by mouth every evening. 90 tablet 3   Probiotic Product (PROBIOTIC ADVANCED PO) Take by mouth daily.     sertraline (ZOLOFT) 50 MG tablet TAKE ONE HALF TABLET BY MOUTH ONCE DAILY FOR 2 WEEKS AND THEN INCREASE TO ONE TABLET BY MOUTH DAILY. 90 tablet 2   spironolactone (ALDACTONE) 25 MG tablet Take 0.5 tablets (12.5 mg total) by mouth daily. 45 tablet  3   No facility-administered medications prior to visit.   Review of Systems  Constitutional:  Negative for chills, fever, malaise/fatigue and weight loss.  HENT:  Negative for congestion, sinus pain and sore throat.   Eyes: Negative.   Respiratory:  Positive for shortness of breath (with exertion). Negative for cough, hemoptysis, sputum production and wheezing.   Cardiovascular:  Negative for chest pain, palpitations, orthopnea, claudication, leg swelling and PND.  Gastrointestinal:  Negative for abdominal pain, heartburn, nausea and vomiting.  Genitourinary: Negative.   Musculoskeletal: Negative.   Skin:  Negative for rash.  Neurological:  Negative for dizziness, weakness and headaches.  Endo/Heme/Allergies: Negative.   Psychiatric/Behavioral: Negative.     Objective:   Vitals:   05/06/22 0954  BP: 116/70  Pulse: 77  SpO2: 98%  Weight: 162 lb (73.5 kg)  Height: '5\' 7"'$  (1.702 m)    Physical Exam Constitutional:      General: She is not in acute distress.    Appearance: Normal appearance. She is normal weight. She is not ill-appearing.  HENT:     Head: Normocephalic and atraumatic.  Eyes:     General: No scleral icterus.    Conjunctiva/sclera: Conjunctivae normal.  Cardiovascular:     Rate and Rhythm: Normal rate and regular rhythm.     Pulses: Normal pulses.     Heart sounds: Normal heart sounds. No murmur heard. Pulmonary:     Effort: Pulmonary effort is normal. No respiratory distress.     Breath sounds: Rales (mild, bibasilar) present. No wheezing or  rhonchi.  Abdominal:     General: Bowel sounds are normal.     Palpations: Abdomen is soft.  Musculoskeletal:     Right lower leg: No edema.     Left lower leg: No edema.  Skin:    General: Skin is warm and dry.  Neurological:     General: No focal deficit present.     Mental Status: She is alert.  Psychiatric:        Mood and Affect: Mood normal.        Behavior: Behavior normal.        Thought Content: Thought content normal.        Judgment: Judgment normal.   CBC    Component Value Date/Time   WBC 8.5 10/10/2020 0830   WBC 7.8 02/17/2019 1443   RBC 4.76 10/10/2020 0830   RBC 4.53 11/24/2019 0000   HGB 14.2 10/10/2020 0830   HCT 43.0 10/10/2020 0830   PLT 324 10/10/2020 0830   MCV 90 10/10/2020 0830   MCH 29.8 10/10/2020 0830   MCH 30.2 12/10/2017 0355   MCHC 33.0 10/10/2020 0830   MCHC 33.1 02/17/2019 1443   RDW 14.5 10/10/2020 0830   LYMPHSABS 1.7 02/17/2019 1443   MONOABS 0.9 02/17/2019 1443   EOSABS 0.3 02/17/2019 1443   BASOSABS 0.1 02/17/2019 1443      Latest Ref Rng & Units 05/01/2022   10:07 AM 06/29/2021    8:33 AM 10/10/2020    8:30 AM  BMP  Glucose 70 - 99 mg/dL 109   99   97    BUN 8 - 27 mg/dL '12   12   13    '$ Creatinine 0.57 - 1.00 mg/dL 0.85   0.94   0.99    BUN/Creat Ratio 12 - '28 14   13   13    '$ Sodium 134 - 144 mmol/L 139   140   139    Potassium  3.5 - 5.2 mmol/L 4.6   4.3   4.7    Chloride 96 - 106 mmol/L 100   102   100    CO2 20 - 29 mmol/L '26   24   25    '$ Calcium 8.7 - 10.3 mg/dL 9.1   9.0   9.4     Chest imaging: HRCT Chest 03/15/21  Two solid right middle lobe pulmonary nodules, largest 5 mm (series 8/image 84). Noadditional significant pulmonary nodules. No significant air trapping or evidence of tracheobronchomalacia on the expiration sequence. Mild patchy confluent subpleural reticulation and ground-glass opacity in both lungs with associated mild traction bronchiectasis and minimal architectural distortion. There is a basilar  predominance to these findings. No frank honeycombing.  CXR 01/03/2021 Heart and mediastinal contours are within normal limits. No focal opacities or effusions. No acute bony abnormality.  CXR 02/01/2013 Findings: Cardiomediastinal silhouette is stable.  No acute  infiltrate or pleural effusion.  No pulmonary edema.  Bony thorax  is unremarkable. Again noted chronic mild interstitial prominence.   PFT:    Latest Ref Rng & Units 01/25/2021   11:04 AM  PFT Results  FVC-Pre L 1.93    FVC-Predicted Pre % 68    FVC-Post L 2.14    FVC-Predicted Post % 75    Pre FEV1/FVC % % 95    Post FEV1/FCV % % 84    FEV1-Pre L 1.84    FEV1-Predicted Pre % 87    FEV1-Post L 1.80    DLCO uncorrected ml/min/mmHg 14.24    DLCO UNC% % 72    DLCO corrected ml/min/mmHg 14.24    DLCO COR %Predicted % 72    DLVA Predicted % 102    01/25/21: Mild restrictive defect and mild diffusion defect  Echo: 10/30/2020  1. Left ventricular ejection fraction, by estimation, is 50 to 55%. The  left ventricle has low normal function. The left ventricle has no regional  wall motion abnormalities. Left ventricular diastolic parameters are  consistent with Grade I diastolic  dysfunction (impaired relaxation). The average left ventricular global  longitudinal strain is -20.4 %.   2. Right ventricular systolic function is moderately reduced. The right  ventricular size is normal. There is normal pulmonary artery systolic  pressure. The estimated right ventricular systolic pressure is 72.0 mmHg.   3. The mitral valve is normal in structure. Trivial mitral valve  regurgitation. No evidence of mitral stenosis.   4. The aortic valve is tricuspid. Aortic valve regurgitation is mild.  Mild aortic valve sclerosis is present, with no evidence of aortic valve  stenosis.   5. The inferior vena cava is normal in size with greater than 50%  respiratory variability, suggesting right atrial pressure of 3 mmHg.   Home Sleep Study  04/03/21 Mild Obstructive Sleep apnea - 5.3 AHI/hr, nocturnal hypoxia  Assessment & Plan:   ILD (interstitial lung disease) ([place])  Discussion: Jessica Mcneil is a 81 year old woman, former smoker with nonischemic cardiomyopathy, hypertension, hyperlipidemia and GERD who returns to pulmonary clinic for interstitial lung disease.  Her recent HRCT Chest scan is stable compared to last years scan with CT chest showing subpleural reticulation and mild traction bronchiectasis. Radiology read is probable UIP. She has moderate restrictive defect and mild diffusion defect on pulmonary function testing from 2022.   We will schedule her for follow up PFTs and 6 minute walk test.   The etiology of her lung disease is likely secondary to her previous history of smoking  and ongoing reflux symptoms with concern for usual interstitial lung disease based on radiology findings.  She is to continue omeprazole to '40mg'$  daily and recommended she continue to sleep with her head elevated. She is to continue Trelegy Ellipta and PRN albuterol.   She is to continue 2L o2 at night for nocturnal hypoxemia.   We will plan to discuss her case at ILD conference.   Follow up in 6 months.  Freda Jackson, MD [facility] Office: [phone]    Current Outpatient Medications:    acetaminophen (TYLENOL) 500 MG tablet, Take 500 mg by mouth. Patient reports taking 1 to 2 tablets a day if needed., Disp: , Rfl:    albuterol (VENTOLIN HFA) 108 (90 Base) MCG/ACT inhaler, Inhale 2 puffs into the lungs every 6 (six) hours as needed for wheezing or shortness of breath., Disp: 8 g, Rfl: 6   allopurinol (ZYLOPRIM) 300 MG tablet, TAKE 1 TABLET BY MOUTH EVERY DAY, Disp: 90 tablet, Rfl: 0   ALPRAZolam (XANAX) 0.5 MG tablet, TAKE 1 TABLET BY MOUTH AT  BEDTIME AS NEEDED FOR  SLEEP/ANXIETY, Disp: 90 tablet, Rfl: 0   aspirin EC 81 MG tablet, Take 81 mg by mouth daily., Disp: , Rfl:    CALCIUM-MAGNESIUM-ZINC PO,  Take 1 tablet by mouth daily., Disp: , Rfl:    docusate sodium (COLACE) 100 MG capsule, Take 200 mg by mouth at bedtime., Disp: , Rfl:    Fluticasone-Umeclidin-Vilant (TRELEGY ELLIPTA) 100-62.5-25 MCG/ACT AEPB, Inhale 1 puff into the lungs daily., Disp: 60 each, Rfl: 6   HYDROcodone-acetaminophen (NORCO) 10-325 MG tablet, Take 1-2 tablets by mouth every 6 (six) hours as needed., Disp: 50 tablet, Rfl: 0   latanoprost (XALATAN) 0.005 % ophthalmic solution, Place 1 drop into the right eye at bedtime., Disp: , Rfl:    metoprolol succinate (TOPROL-XL) 50 MG 24 hr tablet, Take with or immediately following a meal., Disp: 90 tablet, Rfl: 3   Omega-3 Fatty Acids 1200 MG CAPS, Take 1,200 mg by mouth daily., Disp: , Rfl:    omeprazole (PRILOSEC) 40 MG capsule, TAKE 1 CAPSULE (40 MG TOTAL) BY MOUTH DAILY., Disp: 90 capsule, Rfl: 1   pravastatin (PRAVACHOL) 10 MG tablet, Take 1 tablet (10 mg total) by mouth every evening., Disp: 90 tablet, Rfl: 3   Probiotic Product (PROBIOTIC ADVANCED PO), Take by mouth daily., Disp: , Rfl:    sertraline (ZOLOFT) 50 MG tablet, TAKE ONE HALF TABLET BY MOUTH ONCE DAILY FOR 2 WEEKS AND THEN INCREASE TO ONE TABLET BY MOUTH DAILY., Disp: 90 tablet, Rfl: 2   spironolactone (ALDACTONE) 25 MG tablet, Take 0.5 tablets (12.5 mg total) by mouth daily., Disp: 45 tablet, Rfl: 3

## 2022-05-07 ENCOUNTER — Telehealth: Payer: PPO

## 2022-05-13 ENCOUNTER — Ambulatory Visit (INDEPENDENT_AMBULATORY_CARE_PROVIDER_SITE_OTHER): Payer: PPO

## 2022-05-13 DIAGNOSIS — R0600 Dyspnea, unspecified: Secondary | ICD-10-CM

## 2022-05-13 NOTE — Progress Notes (Signed)
Six Minute Walk - 05/13/22 1016       Six Minute Walk   Medications taken before test (dose and time) Trelegy 100, prilosec '40mg'$ , spironolactone 12.'5mg'$  at 7:45    Supplemental oxygen during test? No    Lap distance in meters  34 meters    Laps Completed 9    Partial lap (in meters) 0 meters    Baseline BP (sitting) 108/64    Baseline Heartrate 68    Baseline Dyspnea (Borg Scale) 1.5    Baseline Fatigue (Borg Scale) 2    Baseline SPO2 96 %      End of Test Values    BP (sitting) 120/80    Heartrate 83    Dyspnea (Borg Scale) 3.5    Fatigue (Borg Scale) 4    SPO2 94 %      2 Minutes Post Walk Values   BP (sitting) 110/66    Heartrate 70    SPO2 97 %    Other Symptoms at end of exercise: --   chest tightness which is not a new symptom with that amount of walking     Interpretation   Distance completed 306 meters    Tech Comments: pt walked at an average pace, walked entire 6 minutes without stopping. had complaints of chest tightness which is not a new symptom with that amount of walking. did not desaturate to where she needed O2.

## 2022-05-14 ENCOUNTER — Encounter: Payer: Self-pay | Admitting: Pulmonary Disease

## 2022-05-15 DIAGNOSIS — H21562 Pupillary abnormality, left eye: Secondary | ICD-10-CM | POA: Diagnosis not present

## 2022-05-15 DIAGNOSIS — H2512 Age-related nuclear cataract, left eye: Secondary | ICD-10-CM | POA: Diagnosis not present

## 2022-05-15 DIAGNOSIS — H25812 Combined forms of age-related cataract, left eye: Secondary | ICD-10-CM | POA: Diagnosis not present

## 2022-05-21 ENCOUNTER — Other Ambulatory Visit: Payer: Self-pay | Admitting: Cardiology

## 2022-06-06 ENCOUNTER — Other Ambulatory Visit: Payer: Self-pay | Admitting: Family Medicine

## 2022-06-10 MED ORDER — ALPRAZOLAM 0.5 MG PO TABS: 0.5000 mg | ORAL_TABLET | Freq: Every evening | ORAL | 0 refills | Status: DC | PRN

## 2022-06-14 ENCOUNTER — Telehealth: Payer: Self-pay | Admitting: Family Medicine

## 2022-06-14 MED ORDER — ALPRAZOLAM 0.5 MG PO TABS
0.5000 mg | ORAL_TABLET | Freq: Every evening | ORAL | 0 refills | Status: AC | PRN
Start: 2022-06-14 — End: ?

## 2022-06-18 ENCOUNTER — Ambulatory Visit (INDEPENDENT_AMBULATORY_CARE_PROVIDER_SITE_OTHER): Payer: PPO | Admitting: Pulmonary Disease

## 2022-06-18 DIAGNOSIS — J849 Interstitial pulmonary disease, unspecified: Secondary | ICD-10-CM

## 2022-06-18 LAB — PULMONARY FUNCTION TEST
DL/VA % pred: 81 %
DL/VA: 3.28 ml/min/mmHg/L
DLCO cor % pred: 61 %
DLCO cor: 12.08 ml/min/mmHg
DLCO unc % pred: 61 %
DLCO unc: 12.08 ml/min/mmHg
FEF 25-75 Post: 2.08 L/sec
FEF 25-75 Pre: 3.08 L/sec
FEF2575-%Change-Post: -32 %
FEF2575-%Pred-Post: 137 %
FEF2575-%Pred-Pre: 202 %
FEV1-%Change-Post: -6 %
FEV1-%Pred-Post: 84 %
FEV1-%Pred-Pre: 90 %
FEV1-Post: 1.74 L
FEV1-Pre: 1.87 L
FEV1FVC-%Change-Post: -12 %
FEV1FVC-%Pred-Pre: 125 %
FEV6-%Change-Post: 5 %
FEV6-%Pred-Post: 80 %
FEV6-%Pred-Pre: 76 %
FEV6-Post: 2.13 L
FEV6-Pre: 2.01 L
FEV6FVC-%Change-Post: 0 %
FEV6FVC-%Pred-Post: 105 %
FEV6FVC-%Pred-Pre: 105 %
FVC-%Change-Post: 5 %
FVC-%Pred-Post: 76 %
FVC-%Pred-Pre: 72 %
FVC-Post: 2.13 L
FVC-Pre: 2.01 L
Post FEV1/FVC ratio: 82 %
Post FEV6/FVC ratio: 100 %
Pre FEV1/FVC ratio: 93 %
Pre FEV6/FVC Ratio: 100 %

## 2022-06-19 ENCOUNTER — Telehealth: Payer: PPO | Admitting: Family Medicine

## 2022-07-02 ENCOUNTER — Ambulatory Visit (INDEPENDENT_AMBULATORY_CARE_PROVIDER_SITE_OTHER): Payer: PPO | Admitting: Pulmonary Disease

## 2022-07-02 DIAGNOSIS — J849 Interstitial pulmonary disease, unspecified: Secondary | ICD-10-CM | POA: Diagnosis not present

## 2022-07-07 NOTE — Progress Notes (Signed)
   Interstitial Lung Disease Multidisciplinary Conference   Jessica Mcneil [place]    MRN [id number]    DOB 08-21-1941  Primary Care Physician:Burchette, Alinda Sierras, MD  Referring Physician: Dr Madie Reno MD   Time of Conference: 7.30am- 8.30am Date of conference: 07/02/2022 Location of Conference: -  Virtual   Participating Pulmonary: Dr [name] MD, Dr. Franchot Heidelberg MD, Dr. Madie Reno MD Pathology:  Radiology: Dr Yetta Glassman MD Others:   Brief History:  81 year old woman former smoker who have been following for shortness of breath on exertion. Pulmonary function test show matter is strict a defect in mile diffusion defect. Follow up HRCT chest shows stable basilar findings consistent with probable UIP. Question is whether to initiate anti-fibrotic therapy or continue to monitor.  Serology:  CTD disease serologies 05/09/2021-negative  MDD discussion of CT scan   CT scan 04/08/2022 with subpleural reticular opacities and traction bronchiectasis with basilar predominance.  Probable UIP pattern.  Stable compared to March 2022   PFTs:  06/18/2022-mild diffusion defect  MDD Impression/Recs:  CT scan with findings and probable UIP pattern.  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

## 2022-07-08 ENCOUNTER — Ambulatory Visit: Payer: PPO | Admitting: Cardiology

## 2022-08-06 ENCOUNTER — Other Ambulatory Visit: Payer: Self-pay | Admitting: Family Medicine

## 2022-08-07 DIAGNOSIS — L989 Disorder of the skin and subcutaneous tissue, unspecified: Secondary | ICD-10-CM | POA: Diagnosis not present

## 2022-08-07 DIAGNOSIS — L729 Follicular cyst of the skin and subcutaneous tissue, unspecified: Secondary | ICD-10-CM | POA: Diagnosis not present

## 2022-08-13 ENCOUNTER — Other Ambulatory Visit: Payer: Self-pay | Admitting: Cardiology

## 2022-08-15 ENCOUNTER — Other Ambulatory Visit: Payer: Self-pay | Admitting: Family Medicine

## 2022-08-15 DIAGNOSIS — Z1231 Encounter for screening mammogram for malignant neoplasm of breast: Secondary | ICD-10-CM

## 2022-08-20 ENCOUNTER — Other Ambulatory Visit: Payer: Self-pay | Admitting: Family Medicine

## 2022-08-21 DIAGNOSIS — L720 Epidermal cyst: Secondary | ICD-10-CM | POA: Diagnosis not present

## 2022-09-04 ENCOUNTER — Telehealth: Payer: Self-pay | Admitting: Pharmacist

## 2022-09-04 NOTE — Chronic Care Management (AMB) (Signed)
Chronic Care Management Pharmacy Assistant   Name: Jessica Mcneil  MRN: [id number] DOB: Sep 21, 1941  Reason for Encounter: Disease State   Conditions to be addressed/monitored: General Assessment  Recent office visits:  04/17/22 Jessica Peaches, LPN - Patient presented for Medicare annual wellness exam. No medication changes. Patient voiced goal of weight loss.  Recent consult visits:  08/07/22 Jessica Mcneil [facility]) - Patient presented for Painful skin lesion and other concerns. No medication changes.  07/02/22 Jessica Garfinkel, MD (Pulmonology) - Patient presented for Interstitial lung disease. No medication changes.  06/18/22 Jessica Starr, MD (Pulmonology) - Patient presented for Interstitial lung disease. No medication changes.  05/15/22 Jessica Mcneil, Jessica Mcneil - Claims encounter for Age related nuclear cataract of left eye and other concerns. No other visit details available.  05/13/22 Jessica Mcneil, Jessica Mcneil - (Pulmonology)  Patient presented for Dyspnea unspecified type. No medication changes.  05/06/22 Jessica Starr, MD - (Pulmonology) Patient presented for Interstitial lung disease and other concerns. No medication changes.  05/01/22 Jessica Mcneil, Jessica Schwab, Jessica Mcneil - Patient presented for essential hypertension and other concerns. Stopped Multivitamins.  03/20/22 Jessica Mcneil, Jessica Mcneil (Ophthalmology) - Claims encounter for Age related nuclear cataract of right eye and other concerns.  03/11/22 Claims encounter for snoring and sleep apnea. NO other visit details available.  Hospital visits:  None in previous 6 months  Medications: Outpatient Encounter Medications as of 09/04/2022  Medication Sig   acetaminophen (TYLENOL) 500 MG tablet Take 500 mg by mouth. Patient reports taking 1 to 2 tablets a day if needed.   albuterol (VENTOLIN HFA) 108 (90 Base) MCG/ACT inhaler Inhale 2 puffs into the lungs every 6 (six) hours as needed for wheezing or shortness of breath.   allopurinol  (ZYLOPRIM) 300 MG tablet TAKE 1 TABLET BY MOUTH EVERY DAY   ALPRAZolam (XANAX) 0.5 MG tablet Take 1 tablet (0.5 mg total) by mouth at bedtime as needed for anxiety.   aspirin EC 81 MG tablet Take 81 mg by mouth daily.   CALCIUM-MAGNESIUM-ZINC PO Take 1 tablet by mouth daily.   docusate sodium (COLACE) 100 MG capsule Take 200 mg by mouth at bedtime.   Fluticasone-Umeclidin-Vilant (TRELEGY ELLIPTA) 100-62.5-25 MCG/ACT AEPB Inhale 1 puff into the lungs daily.   HYDROcodone-acetaminophen (NORCO) 10-325 MG tablet Take 1-2 tablets by mouth every 6 (six) hours as needed.   latanoprost (XALATAN) 0.005 % ophthalmic solution Place 1 drop into the right eye at bedtime.   metoprolol succinate (TOPROL-XL) 50 MG 24 hr tablet TAKE ONE TABLET WITH OR IMMEDIATELY FOLLOWING A MEAL.   Omega-3 Fatty Acids 1200 MG CAPS Take 1,200 mg by mouth daily.   omeprazole (PRILOSEC) 40 MG capsule TAKE 1 CAPSULE (40 MG TOTAL) BY MOUTH DAILY.   pravastatin (PRAVACHOL) 10 MG tablet TAKE 1 TABLET BY MOUTH EVERY DAY IN THE EVENING   Probiotic Product (PROBIOTIC ADVANCED PO) Take by mouth daily.   sertraline (ZOLOFT) 50 MG tablet TAKE ONE HALF TABLET BY MOUTH ONCE DAILY FOR 2 WEEKS AND THEN INCREASE TO ONE TABLET BY MOUTH DAILY.   spironolactone (ALDACTONE) 25 MG tablet TAKE 1/2 TABLET BY MOUTH EVERY DAY   No facility-administered encounter medications on file as of 09/04/2022.  Contacted [name] for General Review Call Adherence Review:  Does the Clinical Pharmacist Assistant have access to adherence rates? Yes Adherence rates for STAR metric medications  Pravastatin (Pravachol) 10 mg - Last [facility]/13/22 [facility] Pravastatin (Pravachol) 10 mg - Last filled 04/02/22 90  [facility] at [facility] Does the patient have >5 day gap between last estimated fill dates for any of the above medications or other medication gaps? No  Disease State Questions:  Able to connect with Patient? No   Care Gaps: Hepatitis C Screening -  Overdue COVID Booster - Overdue Zoster  Vaccine - Overdue Flu Vaccine - Overdue PNA Vaccine - Postponed BP- 110/72 06/29/21 AWV- 4/23   Star Rating Drugs: Pravastatin (Pravachol) 10 mg - Last filled 08/13/22 90 DS at [place] Pharmacist Assistant [phone]

## 2022-09-05 ENCOUNTER — Ambulatory Visit
Admission: RE | Admit: 2022-09-05 | Discharge: 2022-09-05 | Disposition: A | Discharge status: Home / Self Care | Payer: PPO | Source: Ambulatory Visit | Attending: Family Medicine | Admitting: Family Medicine

## 2022-09-05 DIAGNOSIS — Z1231 Encounter for screening mammogram for malignant neoplasm of breast: Secondary | ICD-10-CM | POA: Diagnosis not present

## 2022-09-26 DIAGNOSIS — H401131 Primary open-angle glaucoma, bilateral, mild stage: Secondary | ICD-10-CM | POA: Diagnosis not present

## 2022-10-02 ENCOUNTER — Other Ambulatory Visit: Payer: Self-pay | Admitting: Family Medicine

## 2022-10-13 ENCOUNTER — Other Ambulatory Visit: Payer: Self-pay | Admitting: Family Medicine

## 2022-10-13 ENCOUNTER — Other Ambulatory Visit: Payer: Self-pay | Admitting: Pulmonary Disease

## 2022-10-13 DIAGNOSIS — K219 Gastro-esophageal reflux disease without esophagitis: Secondary | ICD-10-CM

## 2022-10-25 ENCOUNTER — Telehealth: Payer: Self-pay | Admitting: Pharmacist

## 2022-10-25 NOTE — Chronic Care Management (AMB) (Unsigned)
Chronic Care Management Pharmacy Assistant   Name: Jessica Mcneil [facility]  MRN: [id number] DOB: 04-05-1941  Reason for Encounter: Disease State / General Assessment per MP   Recent office visits:  None  Recent consult visits:  None   Hospital visits:  None in previous 6 months  Medications: Outpatient Encounter Medications as of 10/25/2022  Medication Sig   acetaminophen (TYLENOL) 500 MG tablet Take 500 mg by mouth. Patient reports taking 1 to 2 tablets a day if needed.   albuterol (VENTOLIN HFA) 108 (90 Base) MCG/ACT inhaler Inhale 2 puffs into the lungs every 6 (six) hours as needed for wheezing or shortness of breath.   allopurinol (ZYLOPRIM) 300 MG tablet TAKE 1 TABLET BY MOUTH EVERY DAY   ALPRAZolam (XANAX) 0.5 MG tablet Take 1 tablet (0.5 mg total) by mouth at bedtime as needed for anxiety.   aspirin EC 81 MG tablet Take 81 mg by mouth daily.   CALCIUM-MAGNESIUM-ZINC PO Take 1 tablet by mouth daily.   docusate sodium (COLACE) 100 MG capsule Take 200 mg by mouth at bedtime.   Fluticasone-Umeclidin-Vilant (TRELEGY ELLIPTA) 100-62.5-25 MCG/ACT AEPB Inhale 1 puff into the lungs daily.   HYDROcodone-acetaminophen (NORCO) 10-325 MG tablet Take 1-2 tablets by mouth every 6 (six) hours as needed.   latanoprost (XALATAN) 0.005 % ophthalmic solution Place 1 drop into the right eye at bedtime.   metoprolol succinate (TOPROL-XL) 50 MG 24 hr tablet TAKE ONE TABLET WITH OR IMMEDIATELY FOLLOWING A MEAL.   Omega-3 Fatty Acids 1200 MG CAPS Take 1,200 mg by mouth daily.   omeprazole (PRILOSEC) 40 MG capsule TAKE 1 CAPSULE (40 MG TOTAL) BY MOUTH DAILY.   pravastatin (PRAVACHOL) 10 MG tablet TAKE 1 TABLET BY MOUTH EVERY DAY IN THE EVENING   Probiotic Product (PROBIOTIC ADVANCED PO) Take by mouth daily.   sertraline (ZOLOFT) 50 MG tablet TAKE ONE HALF TABLET BY MOUTH ONCE DAILY FOR 2 WEEKS AND THEN INCREASE TO ONE TABLET BY MOUTH DAILY.   spironolactone (ALDACTONE) 25 MG tablet TAKE 1/2  TABLET BY MOUTH EVERY DAY   No facility-administered encounter medications on file as of 10/25/2022.   Contacted [name] for General Review Call   Adherence Review:  Does the Clinical Pharmacist Assistant have access to adherence rates? Yes Adherence rates for STAR metric medications Pravastatin (Pravachol) 10 mg - Last [facility]/13/22 90 [facility] Pravastatin (Pravachol) 10 mg - Last filled 04/02/22 90 [facility] at [facility]  Does the patient have >5 day gap between last estimated fill dates for any of the above medications or other medication gaps? No   Disease State Questions:  Able to connect with Patient? {yes/no:20286} Did patient have any problems with their health recently? {yes/no:20286} Note problems and Concerns: Have you had any admissions or emergency room visits or worsening of your condition(s) since last visit? {yes/no:20286} Details of ED visit, hospital visit and/or worsening condition(s): Have you had any visits with new specialists or providers since your last visit? {yes/no:20286} Explain: Have you had any new health care problem(s) since your last visit? {yes/no:20286} New problem(s) reported: Have you run out of any of your medications since you last spoke with clinical pharmacist? {yes/no:20286} What caused you to run out of your medications? Are there any medications you are not taking as prescribed? {yes/no:20286} What kept you from taking your medications as prescribed? Are you having any issues or side effects with your medications? {yes/no:20286} Note of issues or side effects: Do you have any  other health concerns or questions you want to discuss with your Clinical Pharmacist before your next visit? {yes/no:20286} Note additional concerns and questions from Patient. Are there any health concerns that you feel we can do a better job addressing? {yes/no:20286} Note Patient's response. Are you having any problems with any of the following since the  last visit: (select all that apply)  {General Call:27390}  Details: 12. Any falls since last visit? {yes/no:20286}  Details: 13. Any increased or uncontrolled pain since last visit? {yes/no:20286}  Details: 14. Next visit Type: {Telephone/Office:25179}       Visit with:        Date:        Time:  27. Additional Details? {yes/no:20286}    Care Gaps: Hepatitis C Screening - Overdue COVID Booster - Overdue Zoster Vaccine - Overdue Flu Vaccine - Overdue PNA Vaccine - Postponed AWV - 04/17/22  Star Rating Drugs: Pravastatin (Pravachol) 10 mg - Last filled 08/13/22 90 DS at [facility] Pharmacist Assistant [phone]

## 2022-11-06 ENCOUNTER — Other Ambulatory Visit: Payer: Self-pay | Admitting: Family Medicine

## 2022-11-30 ENCOUNTER — Other Ambulatory Visit: Payer: Self-pay | Admitting: Family Medicine

## 2022-12-09 ENCOUNTER — Other Ambulatory Visit: Payer: Self-pay | Admitting: Family Medicine

## 2022-12-30 DIAGNOSIS — H04123 Dry eye syndrome of bilateral lacrimal glands: Secondary | ICD-10-CM | POA: Diagnosis not present

## 2022-12-30 DIAGNOSIS — H401132 Primary open-angle glaucoma, bilateral, moderate stage: Secondary | ICD-10-CM | POA: Diagnosis not present

## 2023-01-08 ENCOUNTER — Other Ambulatory Visit: Payer: Self-pay | Admitting: Family Medicine

## 2023-01-15 ENCOUNTER — Ambulatory Visit (INDEPENDENT_AMBULATORY_CARE_PROVIDER_SITE_OTHER): Payer: PPO | Admitting: Family Medicine

## 2023-01-15 ENCOUNTER — Encounter: Payer: Self-pay | Admitting: Family Medicine

## 2023-01-15 VITALS — BP 110/70 | HR 66 | Temp 97.6°F | Wt 160.5 lb

## 2023-01-15 DIAGNOSIS — F339 Major depressive disorder, recurrent, unspecified: Secondary | ICD-10-CM | POA: Diagnosis not present

## 2023-01-15 DIAGNOSIS — I10 Essential (primary) hypertension: Secondary | ICD-10-CM | POA: Diagnosis not present

## 2023-01-15 DIAGNOSIS — M1A09X0 Idiopathic chronic gout, multiple sites, without tophus (tophi): Secondary | ICD-10-CM

## 2023-01-15 MED ORDER — SERTRALINE HCL 50 MG PO TABS: 50.0000 mg | ORAL_TABLET | Freq: Every day | ORAL | 3 refills | Status: DC

## 2023-01-15 MED ORDER — ALLOPURINOL 300 MG PO TABS
300.0000 mg | ORAL_TABLET | Freq: Every day | ORAL | 3 refills | Status: DC
Start: 2023-01-15 — End: 2024-01-06

## 2023-01-15 NOTE — Progress Notes (Signed)
Established Patient Office Visit  Subjective   Patient ID: Jessica Mcneil, female    DOB: [birth year]  Age: 82 y.o. MRN: [id number]  Chief Complaint  Patient presents with   Medication Refill    HPI   Jessica Mcneil is seen for medical follow-up.  She has history of nonischemic cardiomyopathy with systolic heart failure, history of migraine headaches, hypertension, IBS, osteoarthritis, hyperlipidemia, and interstitial lung disease.  Followed by cardiology and pulmonary.  She still has some dyspnea with activities but no recent chest pains.  Her last echo 2021 showed ejection fraction 50 to 55%.  She quit smoking 1984.  She does take Trelegy which she thinks helps somewhat with her breathing.  No recent gout flares.  Is on allopurinol 300 mg daily.  No recent uric acid.  She has history of recurrent depression and is treated with sertraline 50 mg daily.  She is pleased with the results and denies any active depression or anxiety symptoms.  She had lipids and CMP last May per cardiology.  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   Shortness of breath    Past Surgical History:  Procedure Laterality Date   ANTERIOR AND POSTERIOR REPAIR  2003   bladder   APPENDECTOMY     BELPHAROPTOSIS REPAIR     bilat   BREAST BIOPSY Right    BREAST LUMPECTOMY WITH NEEDLE LOCALIZATION Right 02/12/2013   Procedure: BREAST LUMPECTOMY WITH NEEDLE LOCALIZATION;  Surgeon: Merrie Roof, MD;  Location: [facility];  Service: General;  Laterality: Right;   CHOLECYSTECTOMY     COLONOSCOPY      EYE SURGERY     Glaucoma Implant   TONSILLECTOMY     TOTAL SHOULDER ARTHROPLASTY Right 12/09/2017   Procedure: REVERSE TOTAL SHOULDER ARTHROPLASTY;  Surgeon: Marchia Bond, MD;  Location: [facility];  Service: Orthopedics;  Laterality: Right;   [place]    reports that she quit smoking about 40 years ago. Her smoking use included cigarettes. She smoked an average of 2 packs per day. She has never used smokeless tobacco. She reports that she does not drink alcohol and does not use drugs. family history includes Arthritis in an other family member; Heart disease in her father; Hyperlipidemia in an other family member. Allergies  Allergen Reactions   Lipitor [Atorvastatin]     UNSPECIFIED REACTION    Codeine Itching   Erythromycin Nausea And Vomiting    GI UPSET   Sulfamethoxazole Itching    Over 40 years ago    Review of Systems  Constitutional:  Negative for chills, fever and malaise/fatigue.  Eyes:  Negative for blurred vision.  Respiratory:  Positive for shortness of breath. Negative for hemoptysis and sputum production.   Cardiovascular:  Negative for chest pain.  Neurological:  Negative for dizziness, weakness and headaches.      Objective:     BP 110/70 (BP Location: Left Arm, Patient Position: Sitting, Cuff Size: Normal)   Pulse 66   Temp 97.6 F (  36.4 C) (Oral)   Wt 160 lb 8 oz (72.8 kg)   SpO2 97%   BMI 25.14 kg/m    Physical Exam Vitals reviewed.  Constitutional:      Appearance: She is well-developed.  Eyes:     Pupils: Pupils are equal, round, and reactive to light.  Neck:     Thyroid: No thyromegaly.     Vascular: No JVD.  Cardiovascular:     Rate and Rhythm: Normal rate and regular rhythm.     Heart sounds:     No gallop.  Pulmonary:     Effort: Pulmonary effort is normal. No respiratory distress.     Breath sounds: Rales present. No wheezing.     Comments: She has some crackles in both lung bases consistent with her chronic lung  disease.  No wheezes.  No respiratory distress.  Pulse oximetry 97% room air. Musculoskeletal:     Cervical back: Neck supple.     Right lower leg: No edema.     Left lower leg: No edema.  Neurological:     Mental Status: She is alert.      No results found for any visits on 01/15/23.    The ASCVD Risk score (Arnett DK, et al., 2019) failed to calculate for the following reasons:   The 2019 ASCVD risk score is only valid for ages 21 to 57    Assessment & Plan:   #1 hypertension stable.  Patient takes Aldactone and metoprolol.  Denies any recent orthostatic symptoms.  Continue low-sodium diet.  #2 gout.  No recent flares on allopurinol.  Recheck uric acid.  Goal uric acid less than 6.  Continue low purine diet  #3 history of recurrent depression currently stable on sertraline 50 mg daily.  Refill sertraline for 1 year  #4 chronic interstitial lung disease followed by pulmonary.  #5 history of dilated cardiomyopathy (non-ischemic)-overall stable.  No evidence for volume overload currently.  Continue close follow-up with cardiology.     Return in about 1 year (around 01/16/2024).    Carolann Littler, MD

## 2023-01-16 LAB — URIC ACID: Uric Acid, Serum: 3.9 mg/dL (ref 2.4–7.0)

## 2023-01-29 ENCOUNTER — Telehealth: Payer: Self-pay

## 2023-01-29 NOTE — Progress Notes (Unsigned)
Patient ID: Jessica Mcneil, female   DOB: Apr 15, 1941, 82 y.o.   MRN: [id number]  Care Management & Coordination Services Pharmacy Team  Reason for Encounter: General adherence update   Contacted patient for general health update and medication adherence call.  {US HC Outreach:28874}   What concerns do you have about your medications?  The patient {denies/reports:25180} side effects with their medications.   How often do you forget or accidentally miss a dose? {missed doses:25554}  Do you use a pillbox? {yes/no:20286}  Are you having any problems getting your medications from your pharmacy? {yes/no:20286}  Has the cost of your medications been a concern? {yes/no:20286} If yes, what medication and is patient assistance available or has it been applied for?  Since last visit with PharmD, {no/thefollowing:25210} interventions have been made.   The patient {has/has not:25209} had an ED visit since last contact.   The patient {denies/reports:25180} problems with their health.   Patient {denies/reports:25180} concerns or questions for ***, PharmD at this time.   Counseled patient on: {GENERALCOUNSELING:28686}   Chart Updates:  Recent office visits:  01/15/23 Eulas Post, MD - Patient presented for Idiopathic chronic gout of multiple sites without tophus and other concerns. Changed Sertraline. Stopped Norco.  Recent consult visits:  None  Hospital visits:  None in previous 6 months  Medications: Outpatient Encounter Medications as of 01/29/2023  Medication Sig   acetaminophen (TYLENOL) 500 MG tablet Take 500 mg by mouth. Patient reports taking 1 to 2 tablets a day if needed.   albuterol (VENTOLIN HFA) 108 (90 Base) MCG/ACT inhaler Inhale 2 puffs into the lungs every 6 (six) hours as needed for wheezing or shortness of breath.   allopurinol (ZYLOPRIM) 300 MG tablet Take 1 tablet (300 mg total) by mouth daily.   ALPRAZolam (XANAX) 0.5 MG tablet Take 1 tablet (0.5  mg total) by mouth at bedtime as needed for anxiety.   aspirin EC 81 MG tablet Take 81 mg by mouth daily.   CALCIUM-MAGNESIUM-ZINC PO Take 1 tablet by mouth daily.   docusate sodium (COLACE) 100 MG capsule Take 200 mg by mouth at bedtime.   Fluticasone-Umeclidin-Vilant (TRELEGY ELLIPTA) 100-62.5-25 MCG/ACT AEPB Inhale 1 puff into the lungs daily.   metoprolol succinate (TOPROL-XL) 50 MG 24 hr tablet TAKE ONE TABLET WITH OR IMMEDIATELY FOLLOWING A MEAL.   Omega-3 Fatty Acids 1200 MG CAPS Take 1,200 mg by mouth daily.   omeprazole (PRILOSEC) 40 MG capsule TAKE 1 CAPSULE (40 MG TOTAL) BY MOUTH DAILY.   pravastatin (PRAVACHOL) 10 MG tablet TAKE 1 TABLET BY MOUTH EVERY DAY IN THE EVENING   Probiotic Product (PROBIOTIC ADVANCED PO) Take by mouth daily.   sertraline (ZOLOFT) 50 MG tablet Take 1 tablet (50 mg total) by mouth daily.   spironolactone (ALDACTONE) 25 MG tablet TAKE 1/2 TABLET BY MOUTH EVERY DAY   No facility-administered encounter medications on file as of 01/29/2023.    Recent vitals BP Readings from Last 3 Encounters:  01/15/23 110/70  05/06/22 116/70  05/01/22 110/80   Pulse Readings from Last 3 Encounters:  01/15/23 66  05/06/22 77  05/01/22 69   Wt Readings from Last 3 Encounters:  01/15/23 160 lb 8 oz (72.8 kg)  05/06/22 162 lb (73.5 kg)  05/01/22 163 lb 3.2 oz (74 kg)   BMI Readings from Last 3 Encounters:  01/15/23 25.14 kg/m  05/06/22 25.37 kg/m  05/01/22 25.56 kg/m    Recent lab results    Component Value Date/Time   NA 139  05/01/2022 1007   K 4.6 05/01/2022 1007   CL 100 05/01/2022 1007   CO2 26 05/01/2022 1007   GLUCOSE 109 (H) 05/01/2022 1007   GLUCOSE 129 (H) 02/17/2019 1443   BUN 12 05/01/2022 1007   CREATININE 0.85 05/01/2022 1007   CREATININE 0.83 05/02/2017 0902   CALCIUM 9.1 05/01/2022 1007    Lab Results  Component Value Date   CREATININE 0.85 05/01/2022   GFR 48.78 (L) 02/17/2019   EGFR 70 05/01/2022   GFRNONAA 55 (L) 10/10/2020    GFRAA 64 10/10/2020   No results found for: "HGBA1C", "FRUCTOSAMINE", "MICROALBUR"  Lab Results  Component Value Date   CHOL 200 (H) 05/01/2022   HDL 54 05/01/2022   LDLCALC 127 (H) 05/01/2022   LDLDIRECT 216.7 06/05/2011   TRIG 106 05/01/2022   CHOLHDL 3.7 05/01/2022    Care Gaps: AWV- 04/17/22 Zoster vaccine - Overdue COVID Booster - Overdue PNA Vaccine - Overdue   Star Rating Drugs:  Pravastatin (Pravachol) 10 mg - Last filled 11/15/22 90 DS at [facility] Pharmacist Assistant [phone]

## 2023-03-19 ENCOUNTER — Encounter: Payer: Self-pay | Admitting: Physician Assistant

## 2023-03-19 ENCOUNTER — Ambulatory Visit: Payer: PPO | Attending: Physician Assistant | Admitting: Physician Assistant

## 2023-03-19 VITALS — BP 102/60 | HR 72 | Ht 67.0 in | Wt 159.2 lb

## 2023-03-19 DIAGNOSIS — E785 Hyperlipidemia, unspecified: Secondary | ICD-10-CM

## 2023-03-19 DIAGNOSIS — I5022 Chronic systolic (congestive) heart failure: Secondary | ICD-10-CM

## 2023-03-19 DIAGNOSIS — I10 Essential (primary) hypertension: Secondary | ICD-10-CM

## 2023-03-19 DIAGNOSIS — I4510 Unspecified right bundle-branch block: Secondary | ICD-10-CM | POA: Diagnosis not present

## 2023-03-19 DIAGNOSIS — R0602 Shortness of breath: Secondary | ICD-10-CM | POA: Diagnosis not present

## 2023-03-19 DIAGNOSIS — I428 Other cardiomyopathies: Secondary | ICD-10-CM | POA: Diagnosis not present

## 2023-03-19 NOTE — Patient Instructions (Signed)
Medication Instructions:  Your physician recommends that you continue on your current medications as directed. Please refer to the Current Medication list given to you today.  *If you need a refill on your cardiac medications before your next appointment, please call your pharmacy*   Lab Work: Schedule fasting lipids and lft's If you have labs (blood work) drawn today and your tests are completely normal, you will receive your results only by: [facility] (if you have MyChart) OR A paper copy in the mail If you have any lab test that is abnormal or we need to change your treatment, we will call you to review the results.   Testing/Procedures: Your physician has requested that you have an echocardiogram. Echocardiography is a painless test that uses sound waves to create images of your heart. It provides your doctor with information about the size and shape of your heart and how well your heart's chambers and valves are working. This procedure takes approximately one hour. There are no restrictions for this procedure. Please do NOT wear cologne, perfume, aftershave, or lotions (deodorant is allowed). Please arrive 15 minutes prior to your appointment time.    Follow-Up: At [facility], you and your health needs are our priority.  As part of our continuing mission to provide you with exceptional heart care, we have created designated Provider Care Teams.  These Care Teams include your primary Cardiologist (physician) and Advanced Practice Providers (APPs -  Physician Assistants and Nurse Practitioners) who all work together to provide you with the care you need, when you need it.   Your next appointment:   1 year(s)  Provider:   Fransico Him, MD

## 2023-03-19 NOTE — Progress Notes (Signed)
Office Visit    Patient Name: Jessica Mcneil [facility] Date of Encounter: 03/19/2023  PCP:  Eulas Post, MD   [place]  Cardiologist:  Fransico Him, MD  Advanced Practice Provider:  No care team member to display Electrophysiologist:  None   HPI    Jessica Mcneil is a 82 y.o. female with a past medical history of NICM with normal coronary arteries, chronic combined systolic and diastolic heart failure, hyperlipidemia, restrictive lung disease and bronchiectasis presents today for follow-up appointment.  CHF due to NICM: February 19, 2006 cardiac catheterization revealing normal coronary arteries with an EF of 50%.  EF 35 to 40% by echo.   CPX 03/2006 pVO2: 20.6 (105% predicted), slope of 42.7, pRER 1.09, mild restriction on spirometry Echo 2008 EF 40 to 50% with mild diffuse LV hypokinesis and abnormal left ventricular relaxation, mild AI. Echo 07/2008 EF 55 to 60%, trivial AI Echo 4/12 EF 60-65%, G1 DD, mild MR/AI Echo 06/2014 EF 45-50%, G1 DD, mild MR/AI Echo 08/2015 EF 45-50%, G1 DD, trivial AI Echo 11/2016 EF 45-50%, G1 DD, mild AI Echo 09/2019 EF 45-50%, impaired diastolic filling, mild TR/AI Echo 10/2020 EF 50-55%, G1 DD, mild AI    She was followed by the AHF clinic until 9/16 when she was referred to general cardiology.  Followed by Dr. Meda Coffee and then Dr. Radford Pax.  She was seen in the office 06/29/2021 by Dr. Radford Pax at that time LDL was elevated on pravastatin.  Patient was advised to start rosuvastatin.  However, there was no record of the prescription.  She was advised to return for 69-month follow-up.  She was seen by Stephan Minister, NP 04/2022 and at that time she reported feeling well, however she continued to have dyspnea on exertion.  She gets "very winded" walking for a long distance.  She felt this was nothing new and was stable.  Sleeps with HOB elevated, but denies recent worsening of orthopnea, no PND or edema.  Denied chest  pain, lower extremity edema, fatigue, palpitations, melena, hematuria, hemoptysis, diaphoresis, weakness, syncope, or presyncope.  Admitted to eating too much sugar and eats more when she is stressed.  History of statin intolerance and does not want to try different statin.  Patient thinks that she might tolerate Vascepa since her sister is taking it and it really helped her.  Today, she tells me that she had a lipid panel that was done last year showing LDL 127 and triglycerides 106. She tells me she is due for labs. She is still having SOB and has had chronic issues with this. She has interstitial lung disease with recent PFTs and labs as well as CT scan performed. All studies reviewed today. She wants to make sure her symptoms are not from her heart. We have ordered an Echo today.           Reports no chest pain, pressure, or tightness. No edema, orthopnea, PND. Reports no palpitations.                           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   Shortness of breath    Past Surgical History:  Procedure Laterality Date   ANTERIOR AND POSTERIOR REPAIR  2003   bladder   APPENDECTOMY     BELPHAROPTOSIS REPAIR     bilat   BREAST BIOPSY Right    BREAST LUMPECTOMY WITH NEEDLE LOCALIZATION Right 02/12/2013   Procedure: BREAST LUMPECTOMY WITH NEEDLE LOCALIZATION;  Surgeon: Merrie Roof, MD;  Location: [place];  Service: General;  Laterality: Right;   CHOLECYSTECTOMY     COLONOSCOPY     EYE SURGERY     Glaucoma Implant   TONSILLECTOMY     TOTAL  SHOULDER ARTHROPLASTY Right 12/09/2017   Procedure: REVERSE TOTAL SHOULDER ARTHROPLASTY;  Surgeon: Marchia Bond, MD;  Location: [place];  Service: Orthopedics;  Laterality: Right;   VAGINAL HYSTERECTOMY  1998    Allergies  Allergies  Allergen Reactions   Lipitor [Atorvastatin]     UNSPECIFIED REACTION    Codeine Itching   Erythromycin Nausea And Vomiting    GI UPSET   Sulfamethoxazole Itching    Over 40 years ago     EKGs/Labs/Other Studies Reviewed:   The following studies were reviewed today:  Echocardiogram 10/30/2020 FINDINGS   Left Ventricle: Left ventricular ejection fraction, by estimation, is 50  to 55%. The left ventricle has low normal function. The left ventricle has  no regional wall motion abnormalities. The average left ventricular global  longitudinal strain is -20.4  %. The left ventricular internal cavity size was normal in size. There is  no left ventricular hypertrophy. Left ventricular diastolic parameters are  consistent with Grade I diastolic dysfunction (impaired relaxation).   Right Ventricle: The right ventricular size is normal. Right vetricular  wall thickness was not assessed. Right ventricular systolic function is  moderately reduced. There is normal pulmonary artery systolic pressure.  The tricuspid regurgitant velocity is  2.53 m/s, and with an assumed right atrial pressure of 3 mmHg, the  estimated right ventricular systolic pressure is [id number] mmHg.   Left Atrium: Left atrial size was normal in size.   Right Atrium: Right atrial size was normal in size.   Pericardium: There is no evidence of pericardial effusion.   Mitral Valve: The mitral valve is normal in structure. Trivial mitral  valve regurgitation. No evidence of mitral valve stenosis.   Tricuspid Valve: The tricuspid valve is normal in structure. Tricuspid  valve regurgitation is trivial.   Aortic Valve: The aortic valve is tricuspid. Aortic valve regurgitation is  mild. Aortic  regurgitation PHT measures 505 msec. Mild aortic valve  sclerosis is present, with no evidence of aortic valve stenosis.   Pulmonic Valve: The pulmonic valve was grossly normal. Pulmonic valve  regurgitation is not visualized.   Aorta: The aortic root and ascending aorta are structurally normal, with  no evidence of dilitation.   Venous: The inferior vena cava is normal in size with greater than 50%  respiratory variability, suggesting right atrial pressure of 3 mmHg.   IAS/Shunts: No atrial level shunt detected by color flow Doppler.       EKG:  EKG is not ordered today.  Recent Labs: 05/01/2022: ALT 15; BUN 12; Creatinine, Ser 0.85; Potassium 4.6; Sodium 139  Recent Lipid Panel    Component Value Date/Time   CHOL 200 (H) 05/01/2022 1007  TRIG 106 05/01/2022 1007   HDL 54 05/01/2022 1007   CHOLHDL 3.7 05/01/2022 1007   CHOLHDL 5 02/17/2019 1443   VLDL 34.2 02/17/2019 1443   LDLCALC 127 (H) 05/01/2022 1007   LDLDIRECT 216.7 06/05/2011 1105   Home Medications   Current Meds  Medication Sig   acetaminophen (TYLENOL) 500 MG tablet Take 500 mg by mouth. Patient reports taking 1 to 2 tablets a day if needed.   albuterol (VENTOLIN HFA) 108 (90 Base) MCG/ACT inhaler Inhale 2 puffs into the lungs every 6 (six) hours as needed for wheezing or shortness of breath.   allopurinol (ZYLOPRIM) 300 MG tablet Take 1 tablet (300 mg total) by mouth daily.   ALPRAZolam (XANAX) 0.5 MG tablet Take 1 tablet (0.5 mg total) by mouth at bedtime as needed for anxiety.   aspirin EC 81 MG tablet Take 81 mg by mouth daily.   CALCIUM-MAGNESIUM-ZINC PO Take 1 tablet by mouth daily.   docusate sodium (COLACE) 100 MG capsule Take 200 mg by mouth at bedtime.   Fluticasone-Umeclidin-Vilant (TRELEGY ELLIPTA) 100-62.5-25 MCG/ACT AEPB Inhale 1 puff into the lungs daily.   metoprolol succinate (TOPROL-XL) 50 MG 24 hr tablet TAKE ONE TABLET WITH OR IMMEDIATELY FOLLOWING A MEAL.   Omega-3 Fatty Acids 1200 MG CAPS  Take 1,200 mg by mouth daily.   omeprazole (PRILOSEC) 40 MG capsule TAKE 1 CAPSULE (40 MG TOTAL) BY MOUTH DAILY.   pravastatin (PRAVACHOL) 10 MG tablet TAKE 1 TABLET BY MOUTH EVERY DAY IN THE EVENING   Probiotic Product (PROBIOTIC ADVANCED PO) Take by mouth daily.   sertraline (ZOLOFT) 50 MG tablet Take 1 tablet (50 mg total) by mouth daily.   spironolactone (ALDACTONE) 25 MG tablet Take 12.5 mg by mouth once.   [DISCONTINUED] spironolactone (ALDACTONE) 25 MG tablet TAKE 1/2 TABLET BY MOUTH EVERY DAY     Review of Systems      All other systems reviewed and are otherwise negative except as noted above.  Physical Exam    VS:  BP 102/60   Pulse 72   Ht 5\' 7"  (1.702 m)   Wt 159 lb 3.2 oz (72.2 kg)   SpO2 91%   BMI 24.93 kg/m  , BMI Body mass index is 24.93 kg/m.  Wt Readings from Last 3 Encounters:  03/19/23 159 lb 3.2 oz (72.2 kg)  01/15/23 160 lb 8 oz (72.8 kg)  05/06/22 162 lb (73.5 kg)     GEN: Well nourished, well developed, in no acute distress. HEENT: normal. Neck: Supple, no JVD, carotid bruits, or masses. Cardiac: RRR, no murmurs, rubs, or gallops. No clubbing, cyanosis, edema.  Radials/PT 2+ and equal bilaterally.  Respiratory:  Respirations regular and unlabored, clear to auscultation bilaterally. GI: Soft, nontender, nondistended. MS: No deformity or atrophy. Skin: Warm and dry, no rash. Neuro:  Strength and sensation are intact. Psych: Normal affect.  Assessment & Plan    Essential hypertension -well controlled today -continue current medications aspirin 81 mg daily, metoprolol succinate 50 mg daily, pravastatin 10 mg daily, spironolactone 12 and half milligrams daily  Chronic systolic CHF/NICM -echo today -no edema, euvolemic on exam   Shortness of breath -Multifactorial and chronic -She tells me that when she takes a deep breath it feels tight which is more likely related to her lung disease, she has a pulmonologist  COPD -continue vilant and  albuterol  Hyperlipidemia LDL goal less than 70 -Will repeat a lipid panel and LFTs -Currently she is listed on pravastatin but I  do not think she is taking this  RBBB -not reviewed today since she does not have an EKG      Disposition: Follow up 1 year with Fransico Him, MD or APP.  Signed, Elgie Collard, PA-C 03/19/2023, 6:02 PM [facility]

## 2023-04-01 DIAGNOSIS — H401123 Primary open-angle glaucoma, left eye, severe stage: Secondary | ICD-10-CM | POA: Diagnosis not present

## 2023-04-01 DIAGNOSIS — H5203 Hypermetropia, bilateral: Secondary | ICD-10-CM | POA: Diagnosis not present

## 2023-04-01 DIAGNOSIS — Z961 Presence of intraocular lens: Secondary | ICD-10-CM | POA: Diagnosis not present

## 2023-04-02 ENCOUNTER — Other Ambulatory Visit: Payer: Self-pay | Admitting: Pulmonary Disease

## 2023-04-02 DIAGNOSIS — R0609 Other forms of dyspnea: Secondary | ICD-10-CM

## 2023-04-07 ENCOUNTER — Encounter: Payer: Self-pay | Admitting: Pulmonary Disease

## 2023-04-07 ENCOUNTER — Ambulatory Visit: Payer: PPO | Admitting: Pulmonary Disease

## 2023-04-07 VITALS — BP 118/70 | HR 73 | Ht 67.0 in | Wt 158.6 lb

## 2023-04-07 DIAGNOSIS — G4734 Idiopathic sleep related nonobstructive alveolar hypoventilation: Secondary | ICD-10-CM

## 2023-04-07 DIAGNOSIS — J849 Interstitial pulmonary disease, unspecified: Secondary | ICD-10-CM

## 2023-04-07 NOTE — Progress Notes (Signed)
Synopsis: Referred in 12/2020 for shortness of breath by Jacolyn Reedy, PA  Subjective:   PATIENT ID: Jessica Mcneil GENDER: female DOB: 1941-10-06, MRN: [id number]  HPI  Chief Complaint  Patient presents with   Follow-up    ILD f/u. States her breathing has been stable since last visit.    Jessica Mcneil is a 82 year old woman, former smoker with nonischemic cardiomyopathy, hypertension, hyperlipidemia and GERD who returns to pulmonary clinic for interstitial lung disease.   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   She has been doing well since last visit. She is not using O2 at night. She feels she needs to take more breaks with exertional activity.   OV 05/06/22 She was started on 2L of supplemental O2 at night since last visit based on ONO.   HRCT Chest 04/04/22 showed basilar predominant fibrotic interstitial lung disease. No evidence of progression when compared to 03/15/21 scan. 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   OV 03/04/22 She has increasing shortness of breath since last visit and has noticed she is not able to walk her dogs as far. Her GERD is better with PPI therapy and elevating head of bed.   She is using trelegy ellipta daily and albuterol a couple times per day. She finds more relief with rest for her dyspnea.   She does report hand stiffness bilaterally in the morning time and throughout the day. She denies any skin rashes.   No history of autoimmune conditions in the family.  ANA, anti-CCP, rheumatoid factor were negative 05/09/2021.  OV 03/08/21 She had PFTs done on 01/25/21 and she has moderate restrictive defect and mild diffusion defect.   High resolution CT chest on 03/15/21 shows mild patchy confluent subpleural reticulation and ground glass opacities in both lungs with mild  traction bronchiectasis.   She has benefited from daily Spiriva use and as needed albuterol use. She does report coughing up mucous after using Spiriva in the mornings.    She continues to have frequent GERD symptoms despite being on 20mg  of omeprazole a day. She has a small hiatal hernia noted on CT chest.   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   Shortness of breath      Family History  Problem Relation Age of Onset   Heart disease Father    Arthritis Other    Hyperlipidemia Other    Breast cancer Neg Hx      Social History   Socioeconomic History   Marital status: Divorced    Spouse name: Not on file   Number of children: Not on file   Years of education: Not on file   Highest education level: Associate degree: occupational, Scientist, product/process development, or vocational program  Occupational History   Occupation: retired  Tobacco Use   Smoking status: Former    Packs/day: 2    Types: Cigarettes  Quit date: 12/23/1982    Years since quitting: 40.3   Smokeless tobacco: Never  Vaping Use   Vaping Use: Never used  Substance and Sexual Activity   Alcohol use: No   Drug use: No   Sexual activity: Not on file  Other Topics Concern   Not on file  Social History Narrative   Not on file   Social Determinants of Health   Financial Resource Strain: Low Risk  (01/12/2023)   Overall Financial Resource Strain (CARDIA)    Difficulty of Paying Living Expenses: Not very hard  Food Insecurity: No Food Insecurity (01/12/2023)   Hunger Vital Sign    Worried About Running Out of Food in the Last Year: Never true     Ran Out of Food in the Last Year: Never true  Transportation Needs: No Transportation Needs (01/12/2023)   PRAPARE - Administrator, Civil Service (Medical): No    Lack of Transportation (Non-Medical): No  Physical Activity: Insufficiently Active (01/12/2023)   Exercise Vital Sign    Days of Exercise per Week: 6 days    Minutes of Exercise per Session: 20 min  Stress: Stress Concern Present (01/12/2023)   [employer] of Occupational Health - Occupational Stress Questionnaire    Feeling of Stress : To some extent  Social Connections: Moderately Integrated (01/12/2023)   Social Connection and Isolation Panel [NHANES]    Frequency of Communication with Friends and Family: More than three times a week    Frequency of Social Gatherings with Friends and Family: More than three times a week    Attends Religious Services: 1 to 4 times per year    Active Member of [employer] or Organizations: No    Attends Engineer, structural: More than 4 times per year    Marital Status: Widowed  Intimate Partner Violence: Not At Risk (04/17/2022)   Humiliation, Afraid, Rape, and Kick questionnaire    Fear of Current or Ex-Partner: No    Emotionally Abused: No    Physically Abused: No    Sexually Abused: No     Allergies  Allergen Reactions   Lipitor [Atorvastatin]     UNSPECIFIED REACTION    Codeine Itching   Erythromycin Nausea And Vomiting    GI UPSET   Sulfamethoxazole Itching    Over 40 years ago     Outpatient Medications Prior to Visit  Medication Sig Dispense Refill   acetaminophen (TYLENOL) 500 MG tablet Take 500 mg by mouth. Patient reports taking 1 to 2 tablets a day if needed.     albuterol (VENTOLIN HFA) 108 (90 Base) MCG/ACT inhaler TAKE 2 PUFFS BY MOUTH EVERY 6 HOURS AS NEEDED FOR WHEEZE OR SHORTNESS OF BREATH 8.5 g 0   allopurinol (ZYLOPRIM) 300 MG tablet Take 1 tablet (300 mg total) by mouth daily. 90 tablet 3   ALPRAZolam (XANAX) 0.5 MG tablet Take 1 tablet  (0.5 mg total) by mouth at bedtime as needed for anxiety. 90 tablet 0   aspirin EC 81 MG tablet Take 81 mg by mouth daily.     CALCIUM-MAGNESIUM-ZINC PO Take 1 tablet by mouth daily.     docusate sodium (COLACE) 100 MG capsule Take 200 mg by mouth at bedtime.     Fluticasone-Umeclidin-Vilant (TRELEGY ELLIPTA) 100-62.5-25 MCG/ACT AEPB Inhale 1 puff into the lungs daily. 60 each 6   metoprolol succinate (TOPROL-XL) 50 MG 24 hr tablet TAKE ONE TABLET WITH OR IMMEDIATELY FOLLOWING A MEAL. 90 tablet 3   Omega-3  Fatty Acids 1200 MG CAPS Take 1,200 mg by mouth daily.     omeprazole (PRILOSEC) 40 MG capsule TAKE 1 CAPSULE (40 MG TOTAL) BY MOUTH DAILY. 90 capsule 1   pravastatin (PRAVACHOL) 10 MG tablet TAKE 1 TABLET BY MOUTH EVERY DAY IN THE EVENING 90 tablet 3   Probiotic Product (PROBIOTIC ADVANCED PO) Take by mouth daily.     sertraline (ZOLOFT) 50 MG tablet Take 1 tablet (50 mg total) by mouth daily. 90 tablet 3   spironolactone (ALDACTONE) 25 MG tablet Take 12.5 mg by mouth once.     No facility-administered medications prior to visit.   Review of Systems  Constitutional:  Negative for chills, fever, malaise/fatigue and weight loss.  HENT:  Negative for congestion, sinus pain and sore throat.   Eyes: Negative.   Respiratory:  Positive for shortness of breath (with exertion). Negative for cough, hemoptysis, sputum production and wheezing.   Cardiovascular:  Negative for chest pain, palpitations, orthopnea, claudication, leg swelling and PND.  Gastrointestinal:  Negative for abdominal pain, heartburn, nausea and vomiting.  Genitourinary: Negative.   Musculoskeletal: Negative.   Skin:  Negative for rash.  Neurological:  Negative for dizziness, weakness and headaches.  Endo/Heme/Allergies: Negative.   Psychiatric/Behavioral: Negative.      Objective:   Vitals:   04/07/23 1454  BP: 118/70  Pulse: 73  SpO2: 96%  Weight: 158 lb 9.6 oz (71.9 kg)  Height:  (1.702 m)    Physical  Exam Constitutional:      General: She is not in acute distress.    Appearance: Normal appearance. She is normal weight. She is not ill-appearing.  HENT:     Head: Normocephalic and atraumatic.  Eyes:     General: No scleral icterus.    Conjunctiva/sclera: Conjunctivae normal.  Cardiovascular:     Rate and Rhythm: Normal rate and regular rhythm.     Pulses: Normal pulses.     Heart sounds: Normal heart sounds. No murmur heard. Pulmonary:     Effort: Pulmonary effort is normal. No respiratory distress.     Breath sounds: Rales (bibasilar) present. No wheezing or rhonchi.  Abdominal:     General: Bowel sounds are normal.     Palpations: Abdomen is soft.  Musculoskeletal:     Right lower leg: No edema.     Left lower leg: No edema.  Skin:    General: Skin is warm and dry.  Neurological:     General: No focal deficit present.     Mental Status: She is alert.    CBC    Component Value Date/Time   WBC 8.5 10/10/2020 0830   WBC 7.8 02/17/2019 1443   RBC 4.76 10/10/2020 0830   RBC 4.53 11/24/2019 0000   HGB 14.2 10/10/2020 0830   HCT 43.0 10/10/2020 0830   PLT 324 10/10/2020 0830   MCV 90 10/10/2020 0830   MCH 29.8 10/10/2020 0830   MCH 30.2 12/10/2017 0355   MCHC 33.0 10/10/2020 0830   MCHC 33.1 02/17/2019 1443   RDW 14.5 10/10/2020 0830   LYMPHSABS 1.7 02/17/2019 1443   MONOABS 0.9 02/17/2019 1443   EOSABS 0.3 02/17/2019 1443   BASOSABS 0.1 02/17/2019 1443      Latest Ref Rng & Units 05/01/2022   10:07 AM 06/29/2021    8:33 AM 10/10/2020    8:30 AM  BMP  Glucose 70 - 99 mg/dL 161  99  97   BUN 8 - 27 mg/dL 12  12  13   Creatinine 0.57 - 1.00 mg/dL 1.61  0.96  0.45   BUN/Creat Ratio 12 - 28 14  13  13    Sodium 134 - 144 mmol/L 139  140  139   Potassium 3.5 - 5.2 mmol/L 4.6  4.3  4.7   Chloride 96 - 106 mmol/L 100  102  100   CO2 20 - 29 mmol/L 26  24  25    Calcium 8.7 - 10.3 mg/dL 9.1  9.0  9.4    Chest imaging: HRCT Chest 04/08/22 1. Basilar predominant  fibrotic interstitial lung disease. No evidence of progression when compared with March 15, 2021 prior. 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 Am Jessica Mcneil Crit Care Med Vol 198, Iss 5, [phone], Aug 23 2017. 2. Stable solid pulmonary nodules of the right middle lobe, favored to be benign given 1 year stability. 3. Aortic Atherosclerosis  HRCT Chest 03/15/21  Two solid right middle lobe pulmonary nodules, largest 5 mm (series 8/image 84). Noadditional significant pulmonary nodules. No significant air trapping or evidence of tracheobronchomalacia on the expiration sequence. Mild patchy confluent subpleural reticulation and ground-glass opacity in both lungs with associated mild traction bronchiectasis and minimal architectural distortion. There is a basilar predominance to these findings. No frank honeycombing.  CXR 01/03/2021 Heart and mediastinal contours are within normal limits. No focal opacities or effusions. No acute bony abnormality.  CXR 02/01/2013 Findings: Cardiomediastinal silhouette is stable.  No acute  infiltrate or pleural effusion.  No pulmonary edema.  Bony thorax  is unremarkable. Again noted chronic mild interstitial prominence.   PFT:    Latest Ref Rng & Units 06/18/2022    3:46 PM 01/25/2021   11:04 AM  PFT Results  FVC-Pre L 2.01  1.93   FVC-Predicted Pre % 72  68   FVC-Post L 2.13  2.14   FVC-Predicted Post % 76  75   Pre FEV1/FVC % % 93  95   Post FEV1/FCV % % 82  84   FEV1-Pre L 1.87  1.84   FEV1-Predicted Pre % 90  87   FEV1-Post L 1.74  1.80   DLCO uncorrected ml/min/mmHg 12.08  14.24   DLCO UNC% % 61  72   DLCO corrected ml/min/mmHg 12.08  14.24   DLCO COR %Predicted % 61  72   DLVA Predicted % 81  102   01/25/21: Mild restrictive defect and mild diffusion defect  Echo: 10/30/2020  1. Left ventricular ejection fraction, by  estimation, is 50 to 55%. The  left ventricle has low normal function. The left ventricle has no regional  wall motion abnormalities. Left ventricular diastolic parameters are  consistent with Grade I diastolic  dysfunction (impaired relaxation). The average left ventricular global  longitudinal strain is -20.4 %.   2. Right ventricular systolic function is moderately reduced. The right  ventricular size is normal. There is normal pulmonary artery systolic  pressure. The estimated right ventricular systolic pressure is 28.6 mmHg.   3. The mitral valve is normal in structure. Trivial mitral valve  regurgitation. No evidence of mitral stenosis.   4. The aortic valve is tricuspid. Aortic valve regurgitation is mild.  Mild aortic valve sclerosis is present, with no evidence of aortic valve  stenosis.   5. The inferior vena cava is normal in size with greater than 50%  respiratory variability, suggesting right atrial pressure of 3 mmHg.   Home Sleep  Study 04/03/21 Mild Obstructive Sleep apnea - 5.3 AHI/hr, nocturnal hypoxia  Assessment & Plan:   ILD (interstitial lung disease) - Plan: CT CHEST HIGH RESOLUTION, Pulmonary Function Test, Pulse oximetry, overnight  Nocturnal hypoxemia  Discussion: Jessica Mcneil is a 82 year old woman, former smoker with nonischemic cardiomyopathy, hypertension, hyperlipidemia and GERD who returns to pulmonary clinic for interstitial lung disease.  Case reviewed 06/2022 in ILD conference with recommendation to consider antifibrotic therapy for IPF in absence of exposures and connective tissue disease.  We will repeat HRCT chest and PFTs for monitoring purposes.   We discussed antifibrotic therapy today and provided print outs on the medications for her review.  Repeat ONO test.   Follow up in 4-6 weeks to review CT scan and PFT results.  Jessica Comas, MD  Pulmonary & Critical Care Office: [phone]    Current Outpatient Medications:     acetaminophen (TYLENOL) 500 MG tablet, Take 500 mg by mouth. Patient reports taking 1 to 2 tablets a day if needed., Disp: , Rfl:    albuterol (VENTOLIN HFA) 108 (90 Base) MCG/ACT inhaler, TAKE 2 PUFFS BY MOUTH EVERY 6 HOURS AS NEEDED FOR WHEEZE OR SHORTNESS OF BREATH, Disp: 8.5 g, Rfl: 0   allopurinol (ZYLOPRIM) 300 MG tablet, Take 1 tablet (300 mg total) by mouth daily., Disp: 90 tablet, Rfl: 3   ALPRAZolam (XANAX) 0.5 MG tablet, Take 1 tablet (0.5 mg total) by mouth at bedtime as needed for anxiety., Disp: 90 tablet, Rfl: 0   aspirin EC 81 MG tablet, Take 81 mg by mouth daily., Disp: , Rfl:    CALCIUM-MAGNESIUM-ZINC PO, Take 1 tablet by mouth daily., Disp: , Rfl:    docusate sodium (COLACE) 100 MG capsule, Take 200 mg by mouth at bedtime., Disp: , Rfl:    Fluticasone-Umeclidin-Vilant (TRELEGY ELLIPTA) 100-62.5-25 MCG/ACT AEPB, Inhale 1 puff into the lungs daily., Disp: 60 each, Rfl: 6   metoprolol succinate (TOPROL-XL) 50 MG 24 hr tablet, TAKE ONE TABLET WITH OR IMMEDIATELY FOLLOWING A MEAL., Disp: 90 tablet, Rfl: 3   Omega-3 Fatty Acids 1200 MG CAPS, Take 1,200 mg by mouth daily., Disp: , Rfl:    omeprazole (PRILOSEC) 40 MG capsule, TAKE 1 CAPSULE (40 MG TOTAL) BY MOUTH DAILY., Disp: 90 capsule, Rfl: 1   pravastatin (PRAVACHOL) 10 MG tablet, TAKE 1 TABLET BY MOUTH EVERY DAY IN THE EVENING, Disp: 90 tablet, Rfl: 3   Probiotic Product (PROBIOTIC ADVANCED PO), Take by mouth daily., Disp: , Rfl:    sertraline (ZOLOFT) 50 MG tablet, Take 1 tablet (50 mg total) by mouth daily., Disp: 90 tablet, Rfl: 3   spironolactone (ALDACTONE) 25 MG tablet, Take 12.5 mg by mouth once., Disp: , Rfl:

## 2023-04-07 NOTE — Patient Instructions (Addendum)
We will schedule you for CT Chest scan and pulmonary function tests in the next couple of weeks.  We will schedule you for overnight oxygen test as well  Follow up in 4-5 weeks to review the test results and consider starting antifibrotic therapy. Print outs on these medications provided.

## 2023-04-10 ENCOUNTER — Other Ambulatory Visit: Payer: Self-pay | Admitting: Pulmonary Disease

## 2023-04-10 DIAGNOSIS — K219 Gastro-esophageal reflux disease without esophagitis: Secondary | ICD-10-CM

## 2023-04-18 ENCOUNTER — Ambulatory Visit (HOSPITAL_BASED_OUTPATIENT_CLINIC_OR_DEPARTMENT_OTHER)
Admission: RE | Admit: 2023-04-18 | Discharge: 2023-04-18 | Disposition: A | Discharge status: Home / Self Care | Payer: PPO | Source: Ambulatory Visit | Attending: Family Medicine | Admitting: Family Medicine

## 2023-04-18 DIAGNOSIS — N281 Cyst of kidney, acquired: Secondary | ICD-10-CM | POA: Insufficient documentation

## 2023-04-18 DIAGNOSIS — K7689 Other specified diseases of liver: Secondary | ICD-10-CM | POA: Diagnosis not present

## 2023-04-18 DIAGNOSIS — J849 Interstitial pulmonary disease, unspecified: Secondary | ICD-10-CM | POA: Insufficient documentation

## 2023-04-18 DIAGNOSIS — I700 Atherosclerosis of aorta: Secondary | ICD-10-CM | POA: Diagnosis not present

## 2023-04-18 DIAGNOSIS — J8410 Pulmonary fibrosis, unspecified: Secondary | ICD-10-CM | POA: Diagnosis not present

## 2023-04-18 DIAGNOSIS — I2510 Atherosclerotic heart disease of native coronary artery without angina pectoris: Secondary | ICD-10-CM | POA: Insufficient documentation

## 2023-04-18 DIAGNOSIS — J479 Bronchiectasis, uncomplicated: Secondary | ICD-10-CM | POA: Diagnosis not present

## 2023-04-21 ENCOUNTER — Ambulatory Visit: Payer: PPO

## 2023-04-21 ENCOUNTER — Ambulatory Visit: Payer: PPO | Attending: Cardiovascular Disease

## 2023-04-21 DIAGNOSIS — I10 Essential (primary) hypertension: Secondary | ICD-10-CM

## 2023-04-21 DIAGNOSIS — I5022 Chronic systolic (congestive) heart failure: Secondary | ICD-10-CM | POA: Diagnosis not present

## 2023-04-21 DIAGNOSIS — I428 Other cardiomyopathies: Secondary | ICD-10-CM

## 2023-04-21 DIAGNOSIS — R0602 Shortness of breath: Secondary | ICD-10-CM

## 2023-04-21 DIAGNOSIS — I4510 Unspecified right bundle-branch block: Secondary | ICD-10-CM | POA: Insufficient documentation

## 2023-04-21 DIAGNOSIS — E785 Hyperlipidemia, unspecified: Secondary | ICD-10-CM | POA: Insufficient documentation

## 2023-04-21 LAB — ECHOCARDIOGRAM COMPLETE
Area-P 1/2: 5.06 cm2
P 1/2 time: 493 msec
S' Lateral: 2.8 cm

## 2023-04-22 LAB — LIPID PANEL
Chol/HDL Ratio: 3.6 ratio (ref 0.0–4.4)
Cholesterol, Total: 186 mg/dL (ref 100–199)
HDL: 52 mg/dL (ref >39)
LDL Chol Calc (NIH): 107 mg/dL — ABNORMAL HIGH (ref 0–99)
Triglycerides: 157 mg/dL — ABNORMAL HIGH (ref 0–149)
VLDL Cholesterol Cal: 27 mg/dL (ref 5–40)

## 2023-04-22 LAB — HEPATIC FUNCTION PANEL
ALT: 16 IU/L (ref 0–32)
AST: 15 IU/L (ref 0–40)
Albumin: 4.1 g/dL (ref 3.8–4.8)
Alkaline Phosphatase: 83 IU/L (ref 44–121)
Bilirubin Total: 0.2 mg/dL (ref 0.0–1.2)
Bilirubin, Direct: <0.1 mg/dL (ref 0.00–0.40)
Total Protein: 6.2 g/dL (ref 6.0–8.5)

## 2023-05-19 IMAGING — CT CT CHEST HIGH RESOLUTION
2 of 7 series · 14 of 36 positions shown, 17 images · non-contrast
Comparison: CT chest dated March 15, 2021

CLINICAL DATA: Interstitial lung disease



[Series 4: high resolution · axial · 0.61mm/px · z∈[-254,-46]mm · 11 of 250 slices shown, 14 images]
[im 21/250  mediastinal]
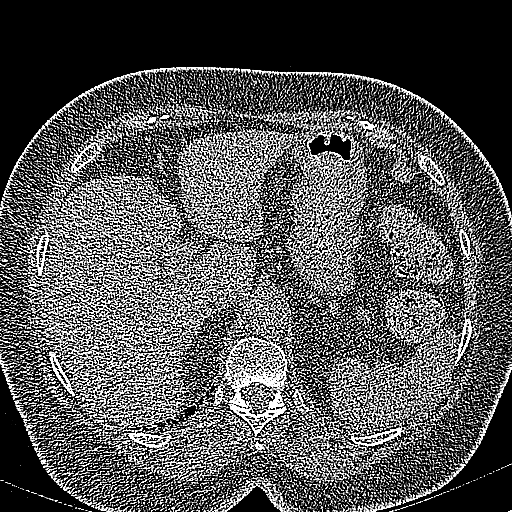
[im 21/250  lung]
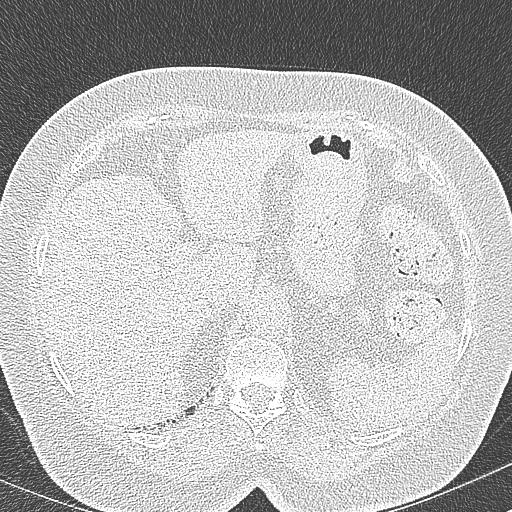
[im 42/250  lung]
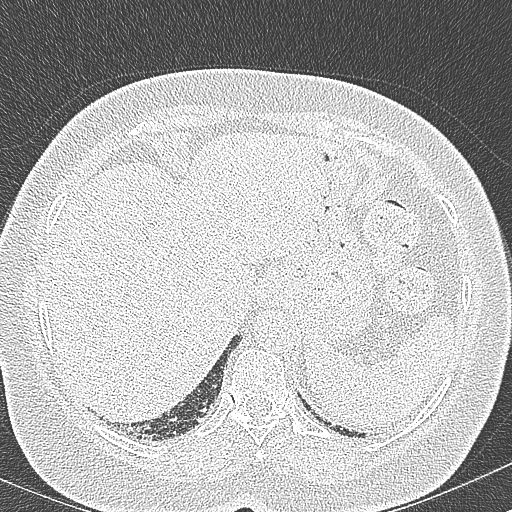
[im 63/250  lung]
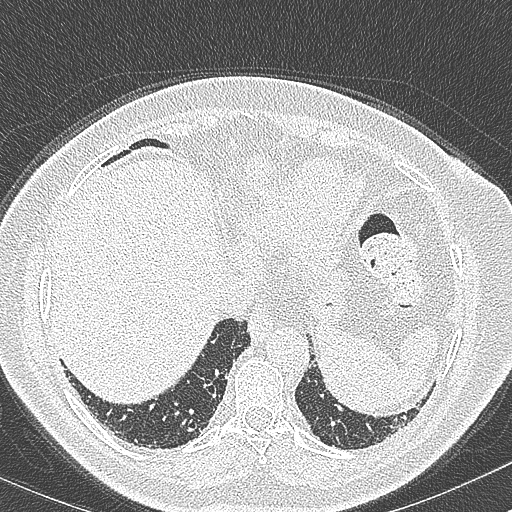
[im 84/250  lung]
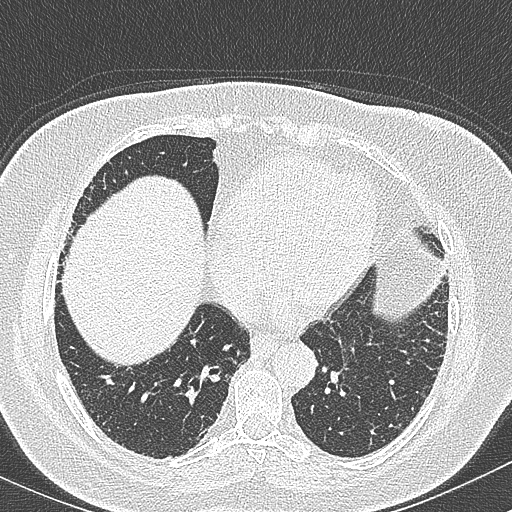
[im 104/250  mediastinal]
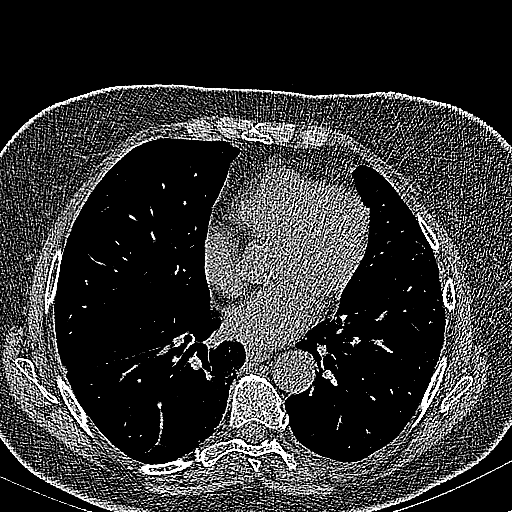
[im 104/250  lung]
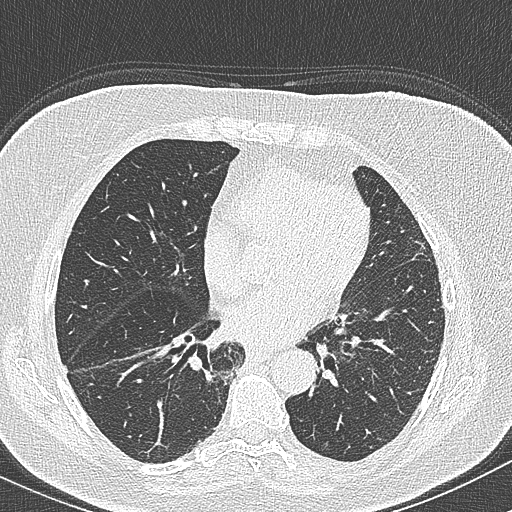
[im 125/250  lung]
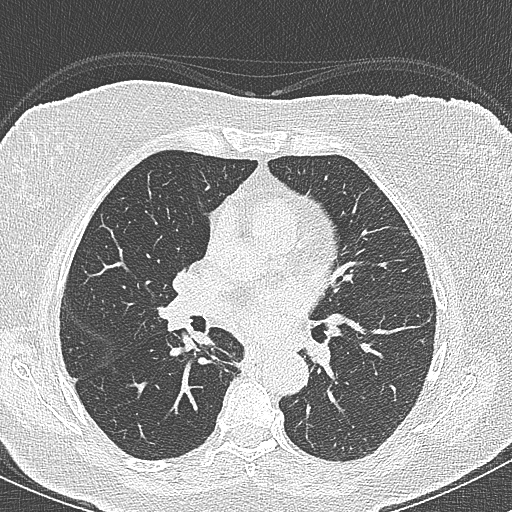
[im 146/250  lung]
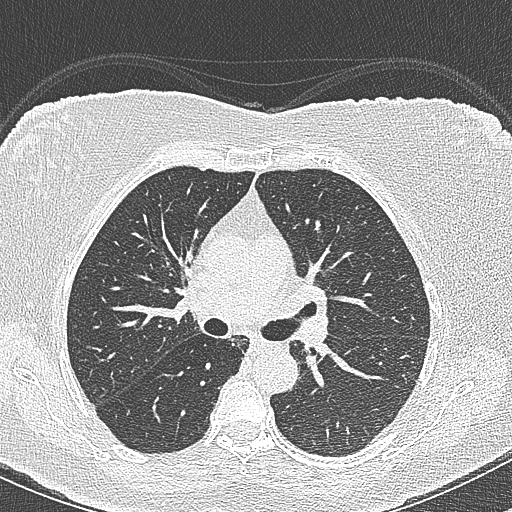
[im 167/250  lung]
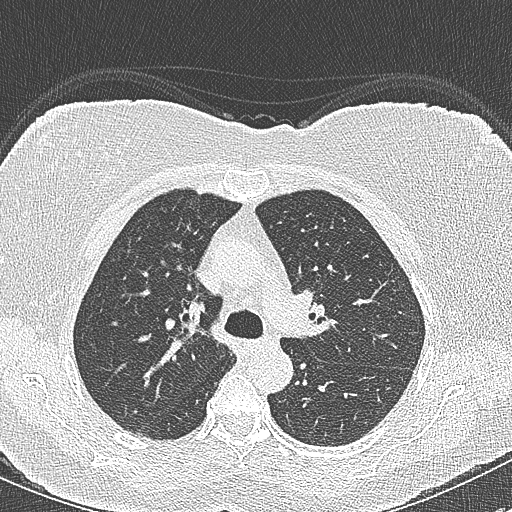
[im 187/250  mediastinal]
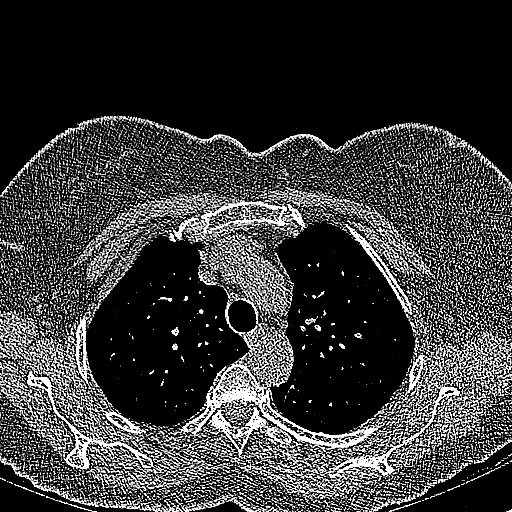
[im 187/250  lung]
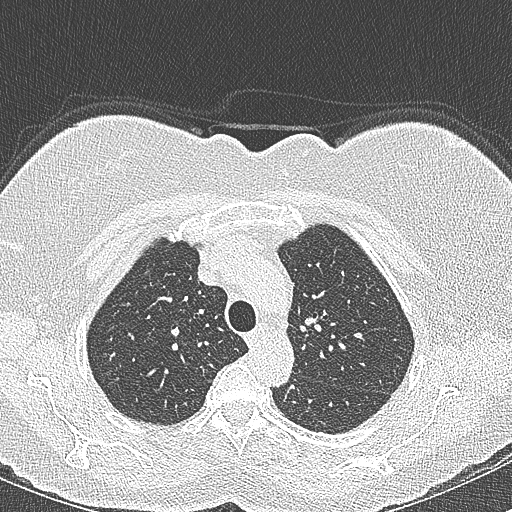
[im 208/250  lung]
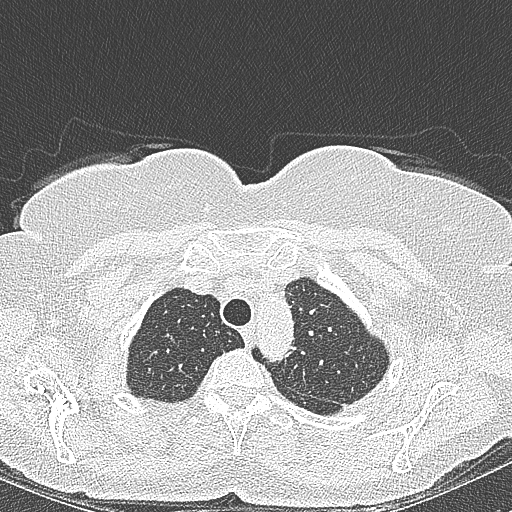
[im 229/250  lung]
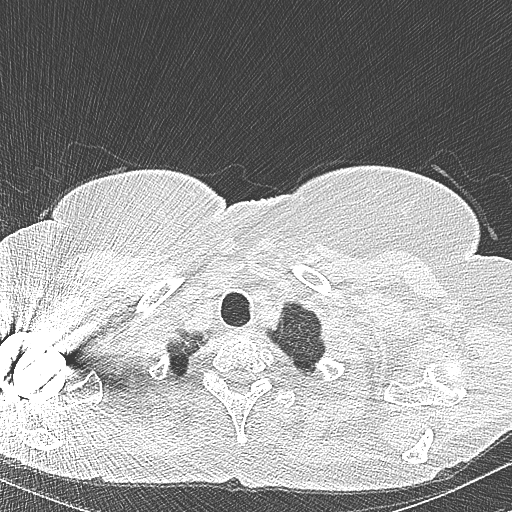

[Series 6: coronal · coronal · 0.53mm/px · 3 of 122 slices shown]
[im 25/122  lung]
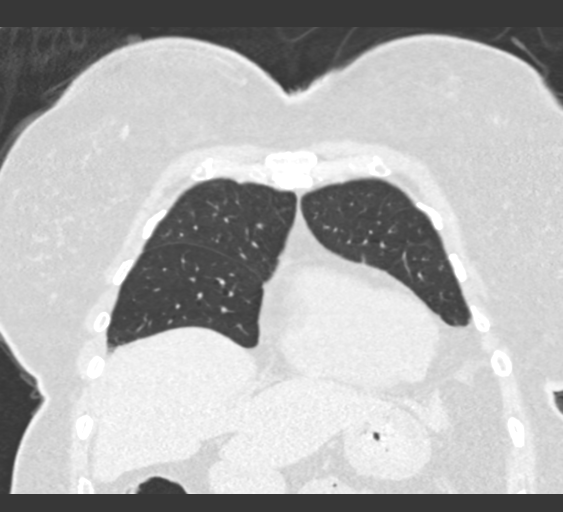
[im 49/122  lung]
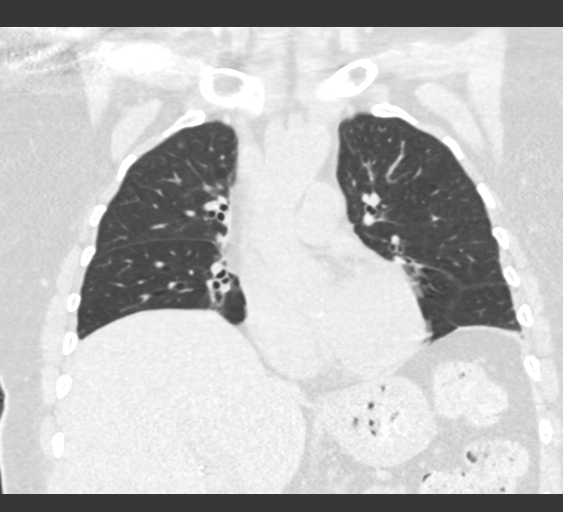
[im 73/122  lung]
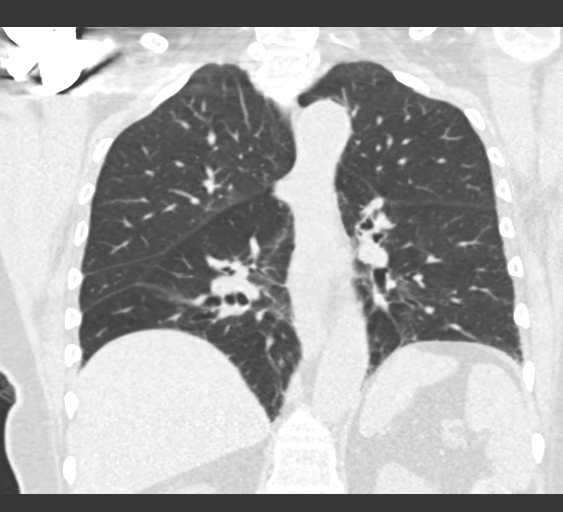

[14 of 36 positions shown; findings below may reference images not displayed]

FINDINGS: Cardiovascular: Normal heart size. No pericardial effusion. Coronary
artery calcifications of the LAD. Atherosclerotic disease of the
thoracic aorta.

Mediastinum/Nodes: Small hiatal hernia. Thyroid is unremarkable. No
pathologically enlarged lymph nodes seen in the chest.

Lungs/Pleura: Central airways are patent. No significant air
trapping. Mild basilar predominant subpleural reticular opacities
with associated traction bronchiectasis. No evidence of honeycomb
change. Unchanged more focal linear opacities are seen in the medial
right lower lobe, likely sequela of prior infection. Stable solid
pulmonary nodules of the right middle lobe measuring 5 mm and 2 mm
on series 4, image 158 and 154.

Upper Abdomen: Cholecystectomy clips.  No acute abnormality.

Musculoskeletal: No chest wall mass or suspicious bone lesions
identified.
IMPRESSION: 1. Basilar predominant fibrotic interstitial lung disease. No
evidence of progression when compared with March 15, 2021 prior.
Findings are categorized as probable UIP per consensus guidelines:
Diagnosis of Idiopathic Pulmonary Fibrosis: An Official
ATS/ERS/JRS/ALAT Clinical Practice Guideline. Am J Respir Crit Care
Med Vol 198, Quirijn 5, [id number][DATE].
2. Stable solid pulmonary nodules of the right middle lobe, favored
to be benign given 1 year stability.
3. Aortic Atherosclerosis ([id number]-0FH.H).

## 2023-05-20 ENCOUNTER — Encounter (HOSPITAL_BASED_OUTPATIENT_CLINIC_OR_DEPARTMENT_OTHER): Payer: Self-pay

## 2023-05-22 DIAGNOSIS — J849 Interstitial pulmonary disease, unspecified: Secondary | ICD-10-CM | POA: Diagnosis not present

## 2023-05-23 ENCOUNTER — Ambulatory Visit: Payer: PPO | Admitting: Pulmonary Disease

## 2023-05-23 ENCOUNTER — Encounter (HOSPITAL_BASED_OUTPATIENT_CLINIC_OR_DEPARTMENT_OTHER): Payer: PPO

## 2023-06-05 ENCOUNTER — Encounter: Payer: Self-pay | Admitting: Pulmonary Disease

## 2023-06-05 ENCOUNTER — Ambulatory Visit (INDEPENDENT_AMBULATORY_CARE_PROVIDER_SITE_OTHER): Payer: PPO | Admitting: Pulmonary Disease

## 2023-06-05 VITALS — BP 118/68 | HR 64 | Ht 66.25 in | Wt 162.2 lb

## 2023-06-05 DIAGNOSIS — J849 Interstitial pulmonary disease, unspecified: Secondary | ICD-10-CM | POA: Diagnosis not present

## 2023-06-05 LAB — PULMONARY FUNCTION TEST
DL/VA % pred: 88 %
DL/VA: 3.54 ml/min/mmHg/L
DLCO cor % pred: 66 %
DLCO cor: 13.57 ml/min/mmHg
DLCO unc % pred: 66 %
DLCO unc: 13.57 ml/min/mmHg
FEF 25-75 Post: 1.07 L/sec
FEF 25-75 Pre: 1.37 L/sec
FEF2575-%Change-Post: -21 %
FEF2575-%Pred-Post: 69 %
FEF2575-%Pred-Pre: 88 %
FEV1-%Change-Post: -6 %
FEV1-%Pred-Post: 68 %
FEV1-%Pred-Pre: 73 %
FEV1-Post: 1.49 L
FEV1-Pre: 1.58 L
FEV1FVC-%Change-Post: -1 %
FEV1FVC-%Pred-Pre: 105 %
FEV6-%Change-Post: -4 %
FEV6-%Pred-Post: 70 %
FEV6-%Pred-Pre: 73 %
FEV6-Post: 1.92 L
FEV6-Pre: 2 L
FEV6FVC-%Change-Post: 0 %
FEV6FVC-%Pred-Post: 104 %
FEV6FVC-%Pred-Pre: 103 %
FVC-%Change-Post: -4 %
FVC-%Pred-Post: 67 %
FVC-%Pred-Pre: 70 %
FVC-Post: 1.94 L
FVC-Pre: 2.03 L
Post FEV1/FVC ratio: 77 %
Post FEV6/FVC ratio: 99 %
Pre FEV1/FVC ratio: 78 %
Pre FEV6/FVC Ratio: 99 %

## 2023-06-05 NOTE — Progress Notes (Signed)
Synopsis: Referred in 12/2020 for shortness of breath by Jacolyn Reedy, PA  Subjective:   PATIENT ID: Jessica Mcneil GENDER: female DOB: 29-Sep-1941, MRN: [id number]  HPI  Chief Complaint  Patient presents with   Follow-up    F/U after PFT and ONO. Denies any new concerns.    Auree Heggie is a 82 year old woman, former smoker with nonischemic cardiomyopathy, hypertension, hyperlipidemia and GERD who returns to pulmonary clinic for interstitial lung disease.   Her breathing has remained stable since last visit. She continues with exertional dyspnea. She is walking her dog daily. She does not wish to start anti fibrotic therapy due to side effect profiles at this time. Her HRCT chest is stable compared to last year but slightly progressed since 2022. PFTs remain overall stable, with slight decline in TLC.   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   She has been doing well since last visit. She is not using O2 at night. She feels she needs to take more breaks with exertional activity.   OV 05/06/22 She was started on 2L of supplemental O2 at night since last visit based on ONO.   HRCT Chest 04/04/22 showed basilar predominant fibrotic interstitial lung disease. No evidence of progression when compared to 03/15/21 scan. 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   OV 03/04/22 She has increasing shortness of breath since last visit and has noticed she is not able to walk her dogs as far. Her GERD is better with PPI therapy and elevating head of bed.   She is using trelegy ellipta daily and albuterol a couple times per day. She finds more relief with rest for her dyspnea.   She does report hand stiffness bilaterally in the morning time and throughout the day. She denies any skin rashes.   No history of  autoimmune conditions in the family.  ANA, anti-CCP, rheumatoid factor were negative 05/09/2021.  OV 03/08/21 She had PFTs done on 01/25/21 and she has moderate restrictive defect and mild diffusion defect.   High resolution CT chest on 03/15/21 shows mild patchy confluent subpleural reticulation and ground glass opacities in both lungs with mild traction bronchiectasis.   She has benefited from daily Spiriva use and as needed albuterol use. She does report coughing up mucous after using Spiriva in the mornings.    She continues to have frequent GERD symptoms despite being on 20mg  of omeprazole a day. She has a small hiatal hernia noted on CT chest.   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   Shortness of breath      Family History  Problem Relation Age of Onset   Heart disease Father    Arthritis Other    Hyperlipidemia Other    Breast cancer Neg Hx      Social History   Socioeconomic History   Marital status: Divorced  Spouse name: Not on file   Number of children: Not on file   Years of education: Not on file   Highest education level: Associate degree: occupational, Scientist, product/process development, or vocational program  Occupational History   Occupation: retired  Tobacco Use   Smoking status: Former    Packs/day: 2    Types: Cigarettes    Quit date: 12/23/1982    Years since quitting: 40.4   Smokeless tobacco: Never  Vaping Use   Vaping Use: Never used  Substance and Sexual Activity   Alcohol use: No   Drug use: No   Sexual activity: Not on file  Other Topics Concern   Not on file  Social  History Narrative   Not on file   Social Determinants of Health   Financial Resource Strain: Low Risk  (01/12/2023)   Overall Financial Resource Strain (CARDIA)    Difficulty of Paying Living Expenses: Not very hard  Food Insecurity: No Food Insecurity (01/12/2023)   Hunger Vital Sign    Worried About Running Out of Food in the Last Year: Never true    Ran Out of Food in the Last Year: Never true  Transportation Needs: No Transportation Needs (01/12/2023)   PRAPARE - Administrator, Civil Service (Medical): No    Lack of Transportation (Non-Medical): No  Physical Activity: Insufficiently Active (01/12/2023)   Exercise Vital Sign    Days of Exercise per Week: 6 days    Minutes of Exercise per Session: 20 min  Stress: Stress Concern Present (01/12/2023)   [employer] of Occupational Health - Occupational Stress Questionnaire    Feeling of Stress : To some extent  Social Connections: Moderately Isolated (01/12/2023)   Social Connection and Isolation Panel [NHANES]    Frequency of Communication with Friends and Family: More than three times a week    Frequency of Social Gatherings with Friends and Family: More than three times a week    Attends Religious Services: 1 to 4 times per year    Active Member of [employer] or Organizations: No    Attends Banker Meetings: Not on file    Marital Status: Widowed  Intimate Partner Violence: Not At Risk (04/17/2022)   Humiliation, Afraid, Rape, and Kick questionnaire    Fear of Current or Ex-Partner: No    Emotionally Abused: No    Physically Abused: No    Sexually Abused: No     Allergies  Allergen Reactions   Lipitor [Atorvastatin]     UNSPECIFIED REACTION    Codeine Itching   Erythromycin Nausea And Vomiting    GI UPSET   Sulfamethoxazole Itching    Over 40 years ago     Outpatient Medications Prior to Visit  Medication Sig Dispense Refill   acetaminophen (TYLENOL) 500 MG tablet Take 500 mg by mouth. Patient  reports taking 1 to 2 tablets a day if needed.     albuterol (VENTOLIN HFA) 108 (90 Base) MCG/ACT inhaler TAKE 2 PUFFS BY MOUTH EVERY 6 HOURS AS NEEDED FOR WHEEZE OR SHORTNESS OF BREATH 8.5 g 0   allopurinol (ZYLOPRIM) 300 MG tablet Take 1 tablet (300 mg total) by mouth daily. 90 tablet 3   ALPRAZolam (XANAX) 0.5 MG tablet Take 1 tablet (0.5 mg total) by mouth at bedtime as needed for anxiety. 90 tablet 0   aspirin EC 81 MG tablet Take 81 mg by mouth daily.     CALCIUM-MAGNESIUM-ZINC PO Take 1 tablet by mouth daily.  docusate sodium (COLACE) 100 MG capsule Take 200 mg by mouth at bedtime.     Fluticasone-Umeclidin-Vilant (TRELEGY ELLIPTA) 100-62.5-25 MCG/ACT AEPB Inhale 1 puff into the lungs daily. 60 each 6   metoprolol succinate (TOPROL-XL) 50 MG 24 hr tablet TAKE ONE TABLET WITH OR IMMEDIATELY FOLLOWING A MEAL. 90 tablet 3   Omega-3 Fatty Acids 1200 MG CAPS Take 1,200 mg by mouth daily.     omeprazole (PRILOSEC) 40 MG capsule TAKE 1 CAPSULE (40 MG TOTAL) BY MOUTH DAILY. 90 capsule 2   pravastatin (PRAVACHOL) 10 MG tablet TAKE 1 TABLET BY MOUTH EVERY DAY IN THE EVENING 90 tablet 3   Probiotic Product (PROBIOTIC ADVANCED PO) Take by mouth daily.     sertraline (ZOLOFT) 50 MG tablet Take 1 tablet (50 mg total) by mouth daily. 90 tablet 3   spironolactone (ALDACTONE) 25 MG tablet Take 12.5 mg by mouth once.     No facility-administered medications prior to visit.   Review of Systems  Constitutional:  Negative for chills, fever, malaise/fatigue and weight loss.  HENT:  Negative for congestion, sinus pain and sore throat.   Eyes: Negative.   Respiratory:  Positive for shortness of breath (with exertion). Negative for cough, hemoptysis, sputum production and wheezing.   Cardiovascular:  Negative for chest pain, palpitations, orthopnea, claudication, leg swelling and PND.  Gastrointestinal:  Negative for abdominal pain, heartburn, nausea and vomiting.  Genitourinary: Negative.    Musculoskeletal: Negative.   Skin:  Negative for rash.  Neurological:  Negative for dizziness, weakness and headaches.  Endo/Heme/Allergies: Negative.   Psychiatric/Behavioral: Negative.      Objective:   Vitals:   06/05/23 0939  BP: 118/68  Pulse: 64  SpO2: 99%  Weight: 162 lb 3.2 oz (73.6 kg)  Height: 5' 6.25" (1.683 m)    Physical Exam Constitutional:      General: She is not in acute distress.    Appearance: Normal appearance. She is normal weight. She is not ill-appearing.  HENT:     Head: Normocephalic and atraumatic.  Cardiovascular:     Rate and Rhythm: Normal rate and regular rhythm.     Pulses: Normal pulses.     Heart sounds: Normal heart sounds. No murmur heard. Pulmonary:     Effort: Pulmonary effort is normal. No respiratory distress.     Breath sounds: Rales (bibasilar) present. No wheezing or rhonchi.  Abdominal:     General: Bowel sounds are normal.     Palpations: Abdomen is soft.  Musculoskeletal:     Right lower leg: No edema.     Left lower leg: No edema.  Skin:    General: Skin is warm and dry.  Neurological:     General: No focal deficit present.     Mental Status: She is alert.    CBC    Component Value Date/Time   WBC 8.5 10/10/2020 0830   WBC 7.8 02/17/2019 1443   RBC 4.76 10/10/2020 0830   RBC 4.53 11/24/2019 0000   HGB 14.2 10/10/2020 0830   HCT 43.0 10/10/2020 0830   PLT 324 10/10/2020 0830   MCV 90 10/10/2020 0830   MCH 29.8 10/10/2020 0830   MCH 30.2 12/10/2017 0355   MCHC 33.0 10/10/2020 0830   MCHC 33.1 02/17/2019 1443   RDW 14.5 10/10/2020 0830   LYMPHSABS 1.7 02/17/2019 1443   MONOABS 0.9 02/17/2019 1443   EOSABS 0.3 02/17/2019 1443   BASOSABS 0.1 02/17/2019 1443      Latest Ref Rng &  Units 05/01/2022   10:07 AM 06/29/2021    8:33 AM 10/10/2020    8:30 AM  BMP  Glucose 70 - 99 mg/dL 161  99  97   BUN 8 - 27 mg/dL 12  12  13    Creatinine 0.57 - 1.00 mg/dL 0.96  0.45  4.09   BUN/Creat Ratio 12 - 28 14  13  13     Sodium 134 - 144 mmol/L 139  140  139   Potassium 3.5 - 5.2 mmol/L 4.6  4.3  4.7   Chloride 96 - 106 mmol/L 100  102  100   CO2 20 - 29 mmol/L 26  24  25    Calcium 8.7 - 10.3 mg/dL 9.1  9.0  9.4    Chest imaging: HRCT Chest 04/18/23 1. Spectrum of findings compatible with mild basilar predominant fibrotic interstitial lung disease without frank honeycombing. No appreciable interval progression since 04/08/2022 CT. Minimal progression since baseline 03/15/2021 CT. Mild patchy air trapping in both lungs, unchanged, compatible with small airways disease. Differential includes UIP or fibrotic NSIP. Chronic hypersensitivity pneumonitis is less likely given the basilar predominance. Findings are indeterminate for UIP per consensus guidelines: Diagnosis of Idiopathic Pulmonary Fibrosis: An Official ATS/ERS/JRS/ALAT Clinical Practice Guideline. Am Rosezetta Schlatter Crit Care Med Vol 198, Iss 5, [phone], Aug 23 2017. 2. One vessel coronary atherosclerosis. 3.  Aortic Atherosclerosis   HRCT Chest 04/08/22 1. Basilar predominant fibrotic interstitial lung disease. No evidence of progression when compared with March 15, 2021 prior. 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 Am Rosezetta Schlatter Crit Care Med Vol 198, Iss 5, [phone], Aug 23 2017. 2. Stable solid pulmonary nodules of the right middle lobe, favored to be benign given 1 year stability. 3. Aortic Atherosclerosis  HRCT Chest 03/15/21  Two solid right middle lobe pulmonary nodules, largest 5 mm (series 8/image 84). Noadditional significant pulmonary nodules. No significant air trapping or evidence of tracheobronchomalacia on the expiration sequence. Mild patchy confluent subpleural reticulation and ground-glass opacity in both lungs with associated mild traction bronchiectasis and minimal architectural distortion. There is a  basilar predominance to these findings. No frank honeycombing.  CXR 01/03/2021 Heart and mediastinal contours are within normal limits. No focal opacities or effusions. No acute bony abnormality.  CXR 02/01/2013 Findings: Cardiomediastinal silhouette is stable.  No acute  infiltrate or pleural effusion.  No pulmonary edema.  Bony thorax  is unremarkable. Again noted chronic mild interstitial prominence.   PFT:    Latest Ref Rng & Units 06/05/2023    8:01 AM 06/18/2022    3:46 PM 01/25/2021   11:04 AM  PFT Results  FVC-Pre L 2.03  P 2.01  1.93   FVC-Predicted Pre % 70  P 72  68   FVC-Post L 1.94  P 2.13  2.14   FVC-Predicted Post % 67  P 76  75   Pre FEV1/FVC % % 78  P 93  95   Post FEV1/FCV % % 77  P 82  84   FEV1-Pre L 1.58  P 1.87  1.84   FEV1-Predicted Pre % 73  P 90  87   FEV1-Post L 1.49  P 1.74  1.80   DLCO uncorrected ml/min/mmHg 13.57  P 12.08  14.24   DLCO UNC% % 66  P 61  72   DLCO corrected ml/min/mmHg 13.57  P 12.08  14.24   DLCO COR %Predicted % 66  P 61  72   DLVA Predicted % 88  P 81  102     P Preliminary result  01/25/21: Mild restrictive defect and mild diffusion defect  Echo: 10/30/2020  1. Left ventricular ejection fraction, by estimation, is 50 to 55%. The  left ventricle has low normal function. The left ventricle has no regional  wall motion abnormalities. Left ventricular diastolic parameters are  consistent with Grade I diastolic  dysfunction (impaired relaxation). The average left ventricular global  longitudinal strain is -20.4 %.   2. Right ventricular systolic function is moderately reduced. The right  ventricular size is normal. There is normal pulmonary artery systolic  pressure. The estimated right ventricular systolic pressure is 28.6 mmHg.   3. The mitral valve is normal in structure. Trivial mitral valve  regurgitation. No evidence of mitral stenosis.   4. The aortic valve is tricuspid. Aortic valve regurgitation is mild.  Mild aortic valve  sclerosis is present, with no evidence of aortic valve  stenosis.   5. The inferior vena cava is normal in size with greater than 50%  respiratory variability, suggesting right atrial pressure of 3 mmHg.   Home Sleep Study 04/03/21 Mild Obstructive Sleep apnea - 5.3 AHI/hr, nocturnal hypoxia  Assessment & Plan:   ILD (interstitial lung disease) (HCC) - Plan: Pulmonary Function Test, CT CHEST HIGH RESOLUTION  Discussion: Jessica Mcneil is a 82 year old woman, former smoker with nonischemic cardiomyopathy, hypertension, hyperlipidemia and GERD who returns to pulmonary clinic for interstitial lung disease.  Case reviewed 06/2022 in ILD conference with recommendation to consider antifibrotic therapy for IPF in absence of exposures and connective tissue disease. She does not wish to start antifibrotic therapy due to side effect profile at this time.   HRCT chest and PFTs remain stable with slight progression compared to 2022.   ONO does not indicate need for nocturnal oxygen. 2023 did not indicate exertional desaturations.   We will complete handicap parking placard form for her today.   Follow up in 1 year with repeat HRCT Chest and PFTs.   Melody Comas, MD [facility] Office: [phone]    Current Outpatient Medications:    acetaminophen (TYLENOL) 500 MG tablet, Take 500 mg by mouth. Patient reports taking 1 to 2 tablets a day if needed., Disp: , Rfl:    albuterol (VENTOLIN HFA) 108 (90 Base) MCG/ACT inhaler, TAKE 2 PUFFS BY MOUTH EVERY 6 HOURS AS NEEDED FOR WHEEZE OR SHORTNESS OF BREATH, Disp: 8.5 g, Rfl: 0   allopurinol (ZYLOPRIM) 300 MG tablet, Take 1 tablet (300 mg total) by mouth daily., Disp: 90 tablet, Rfl: 3   ALPRAZolam (XANAX) 0.5 MG tablet, Take 1 tablet (0.5 mg total) by mouth at bedtime as needed for anxiety., Disp: 90 tablet, Rfl: 0   aspirin EC 81 MG tablet, Take 81 mg by mouth daily., Disp: , Rfl:    CALCIUM-MAGNESIUM-ZINC PO, Take 1  tablet by mouth daily., Disp: , Rfl:    docusate sodium (COLACE) 100 MG capsule, Take 200 mg by mouth at bedtime., Disp: , Rfl:    Fluticasone-Umeclidin-Vilant (TRELEGY ELLIPTA) 100-62.5-25 MCG/ACT AEPB, Inhale 1 puff into the lungs daily., Disp: 60 each, Rfl: 6   metoprolol succinate (TOPROL-XL) 50 MG 24 hr tablet, TAKE ONE TABLET WITH OR IMMEDIATELY FOLLOWING A MEAL., Disp: 90 tablet, Rfl: 3   Omega-3 Fatty Acids 1200 MG CAPS, Take 1,200 mg by mouth daily., Disp: , Rfl:    omeprazole (PRILOSEC) 40 MG capsule,  TAKE 1 CAPSULE (40 MG TOTAL) BY MOUTH DAILY., Disp: 90 capsule, Rfl: 2   pravastatin (PRAVACHOL) 10 MG tablet, TAKE 1 TABLET BY MOUTH EVERY DAY IN THE EVENING, Disp: 90 tablet, Rfl: 3   Probiotic Product (PROBIOTIC ADVANCED PO), Take by mouth daily., Disp: , Rfl:    sertraline (ZOLOFT) 50 MG tablet, Take 1 tablet (50 mg total) by mouth daily., Disp: 90 tablet, Rfl: 3   spironolactone (ALDACTONE) 25 MG tablet, Take 12.5 mg by mouth once., Disp: , Rfl:

## 2023-06-05 NOTE — Patient Instructions (Addendum)
We will fill out a handicap parking form for you today  Your CT Chest scan is stable compared to last year, but slight progression of scarring on the lung from 2 years ago  Your breathing tests are overall stable.  We will continue to monitor your fibrotic lung disease  We will refer you to the pulmonary rehab program  Your overnight oxygen test shows you do not need supplemental oxygen  Follow up in 1 year, call sooner if needed. We will schedule you for breathing tests and CT Chest scan in 1 year

## 2023-06-05 NOTE — Progress Notes (Signed)
Full PFT performed today except for PLETH due to patient having claustrophobia.

## 2023-06-05 NOTE — Patient Instructions (Signed)
Full PFT performed today except for PLETH due to Patient having claustrophobia.

## 2023-06-12 ENCOUNTER — Encounter: Payer: Self-pay | Admitting: Pulmonary Disease

## 2023-06-30 ENCOUNTER — Other Ambulatory Visit: Payer: Self-pay | Admitting: Cardiology

## 2023-06-30 DIAGNOSIS — H401131 Primary open-angle glaucoma, bilateral, mild stage: Secondary | ICD-10-CM | POA: Diagnosis not present

## 2023-06-30 DIAGNOSIS — I10 Essential (primary) hypertension: Secondary | ICD-10-CM

## 2023-07-07 ENCOUNTER — Ambulatory Visit: Payer: PPO | Admitting: Cardiology

## 2023-08-02 ENCOUNTER — Other Ambulatory Visit: Payer: Self-pay | Admitting: Nurse Practitioner

## 2023-08-08 ENCOUNTER — Other Ambulatory Visit: Payer: Self-pay | Admitting: Family Medicine

## 2023-08-08 DIAGNOSIS — Z1231 Encounter for screening mammogram for malignant neoplasm of breast: Secondary | ICD-10-CM

## 2023-08-26 DIAGNOSIS — M5416 Radiculopathy, lumbar region: Secondary | ICD-10-CM | POA: Diagnosis not present

## 2023-08-26 DIAGNOSIS — M545 Low back pain, unspecified: Secondary | ICD-10-CM | POA: Diagnosis not present

## 2023-09-05 DIAGNOSIS — M545 Low back pain, unspecified: Secondary | ICD-10-CM | POA: Diagnosis not present

## 2023-09-09 DIAGNOSIS — M8438XA Stress fracture, other site, initial encounter for fracture: Secondary | ICD-10-CM | POA: Diagnosis not present

## 2023-09-09 DIAGNOSIS — M48061 Spinal stenosis, lumbar region without neurogenic claudication: Secondary | ICD-10-CM | POA: Diagnosis not present

## 2023-09-10 ENCOUNTER — Ambulatory Visit
Admission: RE | Admit: 2023-09-10 | Discharge: 2023-09-10 | Disposition: A | Discharge status: Home / Self Care | Payer: PPO | Source: Ambulatory Visit | Attending: Family Medicine | Admitting: Family Medicine

## 2023-09-10 DIAGNOSIS — Z1231 Encounter for screening mammogram for malignant neoplasm of breast: Secondary | ICD-10-CM | POA: Diagnosis not present

## 2023-10-09 DIAGNOSIS — M8438XA Stress fracture, other site, initial encounter for fracture: Secondary | ICD-10-CM | POA: Diagnosis not present

## 2023-10-09 DIAGNOSIS — M48061 Spinal stenosis, lumbar region without neurogenic claudication: Secondary | ICD-10-CM | POA: Diagnosis not present

## 2023-10-09 DIAGNOSIS — M5416 Radiculopathy, lumbar region: Secondary | ICD-10-CM | POA: Diagnosis not present

## 2023-10-10 ENCOUNTER — Other Ambulatory Visit: Payer: Self-pay | Admitting: Cardiology

## 2023-10-10 DIAGNOSIS — I10 Essential (primary) hypertension: Secondary | ICD-10-CM

## 2023-11-07 DIAGNOSIS — M25552 Pain in left hip: Secondary | ICD-10-CM | POA: Diagnosis not present

## 2023-11-16 DIAGNOSIS — M25552 Pain in left hip: Secondary | ICD-10-CM | POA: Diagnosis not present

## 2023-12-04 ENCOUNTER — Encounter: Payer: PPO | Admitting: Family Medicine

## 2023-12-05 DIAGNOSIS — S76012A Strain of muscle, fascia and tendon of left hip, initial encounter: Secondary | ICD-10-CM | POA: Diagnosis not present

## 2024-01-06 ENCOUNTER — Other Ambulatory Visit: Payer: Self-pay | Admitting: Family Medicine

## 2024-01-06 ENCOUNTER — Other Ambulatory Visit: Payer: Self-pay | Admitting: Pulmonary Disease

## 2024-01-06 DIAGNOSIS — K219 Gastro-esophageal reflux disease without esophagitis: Secondary | ICD-10-CM

## 2024-01-06 NOTE — Telephone Encounter (Signed)
 Pt is requesting a refill on rx. Pt was last seen on 06-05-23, but the lov does not state if the pt is to continue or discontinue this rx. Please advise.

## 2024-01-21 DIAGNOSIS — M8438XA Stress fracture, other site, initial encounter for fracture: Secondary | ICD-10-CM | POA: Diagnosis not present

## 2024-01-22 LAB — HM DEXA SCAN

## 2024-01-30 DIAGNOSIS — Z961 Presence of intraocular lens: Secondary | ICD-10-CM | POA: Diagnosis not present

## 2024-01-30 DIAGNOSIS — H401423 Capsular glaucoma with pseudoexfoliation of lens, left eye, severe stage: Secondary | ICD-10-CM | POA: Diagnosis not present

## 2024-02-03 ENCOUNTER — Other Ambulatory Visit: Payer: Self-pay | Admitting: Family Medicine

## 2024-02-18 DIAGNOSIS — H35372 Puckering of macula, left eye: Secondary | ICD-10-CM | POA: Diagnosis not present

## 2024-02-18 DIAGNOSIS — H35352 Cystoid macular degeneration, left eye: Secondary | ICD-10-CM | POA: Diagnosis not present

## 2024-02-18 DIAGNOSIS — H18232 Secondary corneal edema, left eye: Secondary | ICD-10-CM | POA: Diagnosis not present

## 2024-02-18 DIAGNOSIS — H40023 Open angle with borderline findings, high risk, bilateral: Secondary | ICD-10-CM | POA: Diagnosis not present

## 2024-02-18 DIAGNOSIS — H43823 Vitreomacular adhesion, bilateral: Secondary | ICD-10-CM | POA: Diagnosis not present

## 2024-02-18 DIAGNOSIS — H04123 Dry eye syndrome of bilateral lacrimal glands: Secondary | ICD-10-CM | POA: Diagnosis not present

## 2024-03-05 ENCOUNTER — Other Ambulatory Visit: Payer: Self-pay | Admitting: Family Medicine

## 2024-03-09 ENCOUNTER — Other Ambulatory Visit: Payer: Self-pay | Admitting: Family Medicine

## 2024-03-22 DIAGNOSIS — H35352 Cystoid macular degeneration, left eye: Secondary | ICD-10-CM | POA: Diagnosis not present

## 2024-03-22 DIAGNOSIS — H35372 Puckering of macula, left eye: Secondary | ICD-10-CM | POA: Diagnosis not present

## 2024-03-22 DIAGNOSIS — H18232 Secondary corneal edema, left eye: Secondary | ICD-10-CM | POA: Diagnosis not present

## 2024-03-22 DIAGNOSIS — H43823 Vitreomacular adhesion, bilateral: Secondary | ICD-10-CM | POA: Diagnosis not present

## 2024-03-29 ENCOUNTER — Ambulatory Visit: Payer: Self-pay

## 2024-03-29 NOTE — Telephone Encounter (Signed)
 Copied from CRM [id number]. Topic: Clinical - Red Word Triage >> Mar 29, 2024  1:41 PM Almira Coaster wrote: Red Word that prompted transfer to Nurse Triage: Patient is returning a call from nurse triage.   Chief Complaint: Sinus Pressure/Pain Symptoms: pressure/congestion Frequency: 3-4 days Pertinent Negatives: Patient denies fever, ear aches, sore throat, difficulty breathing, nausea, vomiting, chest pain Disposition: [] ED /[] Urgent Care (no appt availability in office) / [] Appointment(In office/virtual)/ []  [facility]/ [] Home Care/ [x] Refused Recommended Disposition /[] [facility]/ []  Follow-up with PCP Additional Notes: Patient called and advised that she has been having sinus pressure/pain for the past 3-4 days.  She states that whenever she gets this way, she usually needs an antibiotics. She has tried Mucinex and nasal lavages (nasal washes) and is still having symptoms.  She denies any fever, ear aches, sore throat, difficulty breathing, chest pain, nausea, or vomiting at this time. Patient states that Dr Caryl Never has treated her in the past for sinus infections and she is hoping that he could just send her in an antibiotic to help her get better.  She is advised by this RN that a lot of times, providers like to see their patients and assess them before prescribing antibiotics.  She states she understands but transportation is an issue for her at the moment.  She is advised that this RN will send this message to her PCP letting him know that she did not want to set up an appointment at this time for an in office visit and that she is requesting antibiotics to be sent in.  She was okay with with this.  I also advised her that if he does want to see her in the office or at least have a phone/video visit then someone would be in touch with her.  She was okay with that as well but states transportation is an issue at this time. Patient is advised that if anything changes or worsens  to go immediately to the Emergency Room or call 911 for an ambulance to take her to the Emergency Room if needed.  Patient verbalized understanding.  Reason for Disposition  [1] Using nasal washes and pain medicine > 24 hours AND [2] sinus pain (around cheekbone or eye) persists  Answer Assessment - Initial Assessment Questions 1. LOCATION: "Where does it hurt?"      Above top teeth, forehead 2. ONSET: "When did the sinus pain start?"  (e.g., hours, days)      3-4 days 3. SEVERITY: "How bad is the pain?"   (Scale 1-10; mild, moderate or severe)   - MILD (1-3): doesn't interfere with normal activities    - MODERATE (4-7): interferes with normal activities (e.g., work or school) or awakens from sleep   - SEVERE (8-10): excruciating pain and patient unable to do any normal activities        "Getting to a point I need an antibiotic" 4. RECURRENT SYMPTOM: "Have you ever had sinus problems before?" If Yes, ask: "When was the last time?" and "What happened that time?"      Yes-- 5. NASAL CONGESTION: "Is the nose blocked?" If Yes, ask: "Can you open it or must you breathe through your mouth?"     yes 6. NASAL DISCHARGE: "Do you have discharge from your nose?" If so ask, "What color?"     Clear-Yellowish 7. FEVER: "Do you have a fever?" If Yes, ask: "What is it, how was it measured, and when did it start?"  No 8. OTHER SYMPTOMS: "Do you have any other symptoms?" (e.g., sore throat, cough, earache, difficulty breathing)     Cough--mix of dry and productive  Protocols used: Sinus Pain or Congestion-A-AH

## 2024-03-29 NOTE — Telephone Encounter (Signed)
2nd attempt, left VM

## 2024-03-29 NOTE — Telephone Encounter (Signed)
 RN made an attempt to contact the patient for triage. Patient did not answer, RN LVM with a callback number.

## 2024-03-29 NOTE — Telephone Encounter (Signed)
 3rd attempt, left VM, routing to clinic Reason for Disposition . Third attempt to contact caller AND no contact made. Phone number verified.  Protocols used: No Contact or Duplicate Contact Call-A-AH

## 2024-03-31 ENCOUNTER — Ambulatory Visit (HOSPITAL_COMMUNITY)
Admission: RE | Admit: 2024-03-31 | Discharge: 2024-03-31 | Disposition: A | Discharge status: Home / Self Care | Source: Ambulatory Visit | Attending: Pulmonary Disease | Admitting: Pulmonary Disease

## 2024-03-31 DIAGNOSIS — J849 Interstitial pulmonary disease, unspecified: Secondary | ICD-10-CM | POA: Insufficient documentation

## 2024-03-31 DIAGNOSIS — J8410 Pulmonary fibrosis, unspecified: Secondary | ICD-10-CM | POA: Diagnosis not present

## 2024-03-31 DIAGNOSIS — J479 Bronchiectasis, uncomplicated: Secondary | ICD-10-CM | POA: Diagnosis not present

## 2024-03-31 NOTE — Telephone Encounter (Signed)
 Patient informed of the message below. Patient stated she is unable to schedule an appointment today due to scheduling conflict.

## 2024-04-02 ENCOUNTER — Encounter: Payer: Self-pay | Admitting: Family Medicine

## 2024-04-02 ENCOUNTER — Ambulatory Visit (INDEPENDENT_AMBULATORY_CARE_PROVIDER_SITE_OTHER): Admitting: Family Medicine

## 2024-04-02 VITALS — BP 120/66 | HR 65 | Temp 97.5°F | Wt 148.3 lb

## 2024-04-02 DIAGNOSIS — E7800 Pure hypercholesterolemia, unspecified: Secondary | ICD-10-CM

## 2024-04-02 DIAGNOSIS — R739 Hyperglycemia, unspecified: Secondary | ICD-10-CM | POA: Diagnosis not present

## 2024-04-02 DIAGNOSIS — I10 Essential (primary) hypertension: Secondary | ICD-10-CM

## 2024-04-02 DIAGNOSIS — J0100 Acute maxillary sinusitis, unspecified: Secondary | ICD-10-CM | POA: Diagnosis not present

## 2024-04-02 DIAGNOSIS — M1A09X0 Idiopathic chronic gout, multiple sites, without tophus (tophi): Secondary | ICD-10-CM | POA: Diagnosis not present

## 2024-04-02 LAB — LIPID PANEL
Cholesterol: 240 mg/dL — ABNORMAL HIGH (ref 0–200)
HDL: 61.3 mg/dL (ref >39.00)
LDL Cholesterol: 154 mg/dL — ABNORMAL HIGH (ref 0–99)
NonHDL: 178.85
Total CHOL/HDL Ratio: 4
Triglycerides: 126 mg/dL (ref 0.0–149.0)
VLDL: 25.2 mg/dL (ref 0.0–40.0)

## 2024-04-02 LAB — COMPREHENSIVE METABOLIC PANEL WITH GFR
ALT: 14 U/L (ref 0–35)
AST: 21 U/L (ref 0–37)
Albumin: 4.6 g/dL (ref 3.5–5.2)
Alkaline Phosphatase: 96 U/L (ref 39–117)
BUN: 17 mg/dL (ref 6–23)
CO2: 30 meq/L (ref 19–32)
Calcium: 9.5 mg/dL (ref 8.4–10.5)
Chloride: 101 meq/L (ref 96–112)
Creatinine, Ser: 0.87 mg/dL (ref 0.40–1.20)
GFR: 62.53 mL/min (ref >60.00)
Glucose, Bld: 106 mg/dL — ABNORMAL HIGH (ref 70–99)
Potassium: 3.9 meq/L (ref 3.5–5.1)
Sodium: 140 meq/L (ref 135–145)
Total Bilirubin: 0.5 mg/dL (ref 0.2–1.2)
Total Protein: 7.3 g/dL (ref 6.0–8.3)

## 2024-04-02 LAB — HEMOGLOBIN A1C: Hgb A1c MFr Bld: 6.1 % (ref 4.6–6.5)

## 2024-04-02 MED ORDER — AMOXICILLIN-POT CLAVULANATE 875-125 MG PO TABS: 1.0000 | ORAL_TABLET | Freq: Two times a day (BID) | ORAL | 0 refills | Status: DC

## 2024-04-02 MED ORDER — EZETIMIBE 10 MG PO TABS: 10.0000 mg | ORAL_TABLET | Freq: Every day | ORAL | 0 refills | Status: DC

## 2024-04-02 MED ORDER — PRAVASTATIN SODIUM 20 MG PO TABS: 20.0000 mg | ORAL_TABLET | Freq: Every day | ORAL | 0 refills | Status: DC

## 2024-04-02 NOTE — Addendum Note (Signed)
 Addended by: Christy Sartorius on: 04/02/2024 01:28 PM   Modules accepted: Orders

## 2024-04-02 NOTE — Addendum Note (Signed)
 Addended by: Christy Sartorius on: 04/02/2024 01:29 PM   Modules accepted: Orders

## 2024-04-02 NOTE — Progress Notes (Signed)
 Established Patient Office Visit  Subjective   Patient ID: Modelle Mcneil, female    DOB: [birth year]  Age: 83 y.o. MRN: [id number]  Chief Complaint  Patient presents with   Sinusitis    Patient complains of sinusitis, x5 days     HPI   Jessica Mcneil is seen today for the following items  Acute issue of possible sinusitis.  She has had several days of some progressive headache and left maxillary sinus pain and pressure.  Very little drainage.  Some laryngitis.  No cough.  No body aches.  No fever.  She has had tendency to get sinusitis in the past.  She initially thought this was allergy related but states the headache and facial pain are symptoms that are different from her usual allergies.  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  She gets this annually and has had recent follow-up per pulmonary.  Her pulmonary status is stable.  Denies any significant dyspnea with day-to-day activities.  She has hyperlipidemia and history of systolic heart failure.  Also has hypertension.  Medications reviewed and compliant with all.  Her hyperlipidemia is treated with pravastatin.  Due for follow-up lipids.  She has had several elevated glucose levels in looking over previous labs but not sure how many of these were fasting.  No polyuria or polydipsia.  She has history of gout which she has been treated prophylactically with allopurinol with no flareup in several years.  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   Shortness of breath    Past Surgical  History:  Procedure Laterality Date   ANTERIOR AND POSTERIOR REPAIR  2003   bladder   APPENDECTOMY     BELPHAROPTOSIS REPAIR     bilat   BREAST BIOPSY Right    BREAST LUMPECTOMY WITH NEEDLE LOCALIZATION Right 02/12/2013   Procedure: BREAST LUMPECTOMY WITH NEEDLE LOCALIZATION;  Surgeon: Robyne Askew, MD;  Location: [facility];  Service: General;  Laterality: Right;   CHOLECYSTECTOMY     COLONOSCOPY     EYE SURGERY     Glaucoma Implant   TONSILLECTOMY     TOTAL SHOULDER ARTHROPLASTY Right 12/09/2017   Procedure: REVERSE TOTAL SHOULDER ARTHROPLASTY;  Surgeon: Teryl Lucy, MD;  Location: [facility] OR;  Service: Orthopedics;  Laterality: Right;   VAGINAL HYSTERECTOMY  1998    reports that she quit smoking about 41 years ago. Her smoking use included cigarettes. She has never used smokeless tobacco. She reports that she does not drink alcohol and does not use drugs. family history includes Arthritis in an other family member; Heart disease in her father; Hyperlipidemia in an other family member. Allergies  Allergen Reactions   Lipitor [Atorvastatin]     UNSPECIFIED REACTION    Codeine Itching   Erythromycin Nausea And Vomiting    GI UPSET   Sulfamethoxazole Itching    Over 40 years ago     Review of Systems  Constitutional:  Negative for chills and fever.  HENT:  Positive for congestion and sinus pain. Negative for ear pain.   Respiratory:  Negative for cough.   Cardiovascular:  Negative for chest pain.  Neurological:  Positive for headaches.      Objective:     BP 120/66 (BP Location: Left Arm, Patient Position: Sitting, Cuff Size: Normal)   Pulse 65   Temp (!) 97.5 F (36.4 C) (Oral)   Wt 148 lb 4.8 oz (67.3 kg)   SpO2 95%   BMI 23.76 kg/m  BP Readings from Last 3 Encounters:  04/02/24 120/66  06/05/23 118/68  04/07/23 118/70   Wt Readings from Last 3 Encounters:  04/02/24 148 lb 4.8 oz (67.3 kg)  06/05/23 162 lb 3.2 oz (73.6 kg)  04/07/23 158 lb  9.6 oz (71.9 kg)      Physical Exam Vitals reviewed.  Constitutional:      General: She is not in acute distress.    Appearance: She is not ill-appearing.  HENT:     Right Ear: Tympanic membrane normal.     Left Ear: Tympanic membrane normal.  Cardiovascular:     Rate and Rhythm: Normal rate and regular rhythm.  Pulmonary:     Effort: Pulmonary effort is normal.     Comments: She does have some crackles/rales in both bases which apparently are chronic Neurological:     Mental Status: She is alert.      No results found for any visits on 04/02/24.    The ASCVD Risk score (Arnett DK, et al., 2019) failed to calculate for the following reasons:   The 2019 ASCVD risk score is only valid for ages 64 to 74    Assessment & Plan:   #1 acute left maxillary sinusitis.  Start Augmentin 875 mg twice daily for 10 days.  Follow-up for any persistent or worsening symptoms  #2 history of gout stable on allopurinol.  Continue current dose of allopurinol.  #3 hyperlipidemia.  Patient on pravastatin.  Currently did not tolerate higher potency statins.  Recheck lipid and CMP.  #4 history of systolic heart failure.  Needs follow-up with cardiology.  She is in process of getting appointment with new cardiologist.  Her previous cardiologist left.  #5 history of hyperglycemia by intermittent labs.  Check A1c today.   No follow-ups on file.    Evelena Peat, MD

## 2024-04-03 ENCOUNTER — Other Ambulatory Visit: Payer: Self-pay | Admitting: Family Medicine

## 2024-04-07 ENCOUNTER — Other Ambulatory Visit: Payer: Self-pay | Admitting: Nurse Practitioner

## 2024-04-07 ENCOUNTER — Other Ambulatory Visit: Payer: Self-pay | Admitting: Family Medicine

## 2024-04-20 DIAGNOSIS — H401423 Capsular glaucoma with pseudoexfoliation of lens, left eye, severe stage: Secondary | ICD-10-CM | POA: Diagnosis not present

## 2024-04-27 DIAGNOSIS — H18232 Secondary corneal edema, left eye: Secondary | ICD-10-CM | POA: Diagnosis not present

## 2024-04-27 DIAGNOSIS — H43823 Vitreomacular adhesion, bilateral: Secondary | ICD-10-CM | POA: Diagnosis not present

## 2024-04-27 DIAGNOSIS — H35352 Cystoid macular degeneration, left eye: Secondary | ICD-10-CM | POA: Diagnosis not present

## 2024-04-27 DIAGNOSIS — H35372 Puckering of macula, left eye: Secondary | ICD-10-CM | POA: Diagnosis not present

## 2024-04-28 ENCOUNTER — Other Ambulatory Visit: Payer: Self-pay | Admitting: Nurse Practitioner

## 2024-04-28 NOTE — Telephone Encounter (Signed)
 Dr. Charl Concha pt. This was discontinued and now she's taking Pravastatin  20mg  which was prescribed by PCP. Does Dr. Micael Adas want to refill or should this go to the PCP?

## 2024-05-05 ENCOUNTER — Other Ambulatory Visit: Payer: Self-pay | Admitting: Nurse Practitioner

## 2024-05-05 ENCOUNTER — Other Ambulatory Visit: Payer: Self-pay | Admitting: Family Medicine

## 2024-05-18 ENCOUNTER — Ambulatory Visit: Admitting: Pulmonary Disease

## 2024-05-18 VITALS — BP 107/67 | HR 62 | Ht 68.0 in | Wt 150.8 lb

## 2024-05-18 DIAGNOSIS — Z87891 Personal history of nicotine dependence: Secondary | ICD-10-CM

## 2024-05-18 DIAGNOSIS — J849 Interstitial pulmonary disease, unspecified: Secondary | ICD-10-CM

## 2024-05-18 NOTE — Patient Instructions (Addendum)
 Schedule pulmonary function tests at the front desk at check out  We will message you with the results once done.  Your CT Chest looks stable, plan to repeat a scan next year  Follow up in 1 year, call us  if your breathing changes/worsens

## 2024-05-18 NOTE — Progress Notes (Signed)
 Synopsis: Referred in 12/2020 for shortness of breath by Theotis Flake, PA  Subjective:   PATIENT ID: Jessica Mcneil GENDER: female DOB: 29-Dec-1940, MRN: [id number]  HPI  Chief Complaint  Patient presents with   Follow-up    Pt states swelling in ankles    Kyleen Villatoro is a 83 year old woman, former smoker with nonischemic cardiomyopathy, hypertension, hyperlipidemia and GERD who returns to pulmonary clinic for interstitial lung disease.   Her recent CT scan shows no change in her pulmonary fibrosis from last year. Breathing remains stable, with shortness of breath and occasional lightheadedness on exertion. Early spring pollen exacerbated her symptoms, but she is managing. She expresses concern about potential gastrointestinal side effects from antifibrotic treatments.  OV 06/05/23 Her breathing has remained stable since last visit. She continues with exertional dyspnea. She is walking her dog daily. She does not wish to start anti fibrotic therapy due to side effect profiles at this time. Her HRCT chest is stable compared to last year but slightly progressed since 2022. PFTs remain overall stable, with slight decline in TLC.   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   She has been doing well since last visit. She is not using O2 at night. She feels she needs to take more breaks with exertional activity.   OV 05/06/22 She was started on 2L of supplemental O2 at night since last visit based on ONO.   HRCT Chest 04/04/22 showed basilar predominant fibrotic interstitial lung disease. No evidence of progression when compared to 03/15/21 scan. 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   OV 03/04/22 She has increasing shortness of breath since last visit and has noticed she is not able to walk her dogs  as far. Her GERD is better with PPI therapy and elevating head of bed.   She is using trelegy ellipta  daily and albuterol  a couple times per day. She finds more relief with rest for her dyspnea.   She does report hand stiffness bilaterally in the morning time and throughout the day. She denies any skin rashes.   No history of autoimmune conditions in the family.  ANA, anti-CCP, rheumatoid factor were negative 05/09/2021.  OV 03/08/21 She had PFTs done on 01/25/21 and she has moderate restrictive defect and mild diffusion defect.   High resolution CT chest on 03/15/21 shows mild patchy confluent subpleural reticulation and ground glass opacities in both lungs with mild traction bronchiectasis.   She has benefited from daily Spiriva  use and as needed albuterol  use. She does report coughing up mucous after using Spiriva  in the mornings.    She continues to have frequent GERD symptoms despite being on 20mg  of omeprazole  a day. She has a small hiatal hernia noted on CT chest.   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   Shortness of breath  Family History  Problem Relation Age of Onset   Heart disease Father    Arthritis Other    Hyperlipidemia Other    Breast cancer Neg Hx      Social History   Socioeconomic History   Marital status: Divorced    Spouse name: Not on file   Number of children: Not on file   Years of education: Not on file   Highest education level: Associate degree: occupational, Scientist, product/process development, or vocational program  Occupational History   Occupation: retired  Tobacco Use    Smoking status: Former    Current packs/day: 0.00    Types: Cigarettes    Quit date: 12/23/1982    Years since quitting: 41.4   Smokeless tobacco: Never  Vaping Use   Vaping status: Never Used  Substance and Sexual Activity   Alcohol use: No   Drug use: No   Sexual activity: Not on file  Other Topics Concern   Not on file  Social History Narrative   Not on file   Social Drivers of Health   Financial Resource Strain: Low Risk  (01/12/2023)   Overall Financial Resource Strain (CARDIA)    Difficulty of Paying Living Expenses: Not very hard  Food Insecurity: No Food Insecurity (01/12/2023)   Hunger Vital Sign    Worried About Running Out of Food in the Last Year: Never true    Ran Out of Food in the Last Year: Never true  Transportation Needs: No Transportation Needs (01/12/2023)   PRAPARE - Administrator, Civil Service (Medical): No    Lack of Transportation (Non-Medical): No  Physical Activity: Insufficiently Active (01/12/2023)   Exercise Vital Sign    Days of Exercise per Week: 6 days    Minutes of Exercise per Session: 20 min  Stress: Stress Concern Present (01/12/2023)   [employer] of Occupational Health - Occupational Stress Questionnaire    Feeling of Stress : To some extent  Social Connections: Moderately Isolated (01/12/2023)   Social Connection and Isolation Panel [NHANES]    Frequency of Communication with Friends and Family: More than three times a week    Frequency of Social Gatherings with Friends and Family: More than three times a week    Attends Religious Services: 1 to 4 times per year    Active Member of [employer] or Organizations: No    Attends Banker Meetings: Not on file    Marital Status: Widowed  Intimate Partner Violence: Not At Risk (04/17/2022)   Humiliation, Afraid, Rape, and Kick questionnaire    Fear of Current or Ex-Partner: No    Emotionally Abused: No    Physically Abused: No    Sexually Abused: No     Allergies   Allergen Reactions   Lipitor [Atorvastatin ]     UNSPECIFIED REACTION    Codeine Itching   Erythromycin Nausea And Vomiting    GI UPSET   Sulfamethoxazole Itching    Over 40 years ago     Outpatient Medications Prior to Visit  Medication Sig Dispense Refill   acetaminophen  (TYLENOL ) 500 MG tablet Take 500 mg by mouth. Patient reports taking 1 to 2 tablets a day if needed.     albuterol  (VENTOLIN  HFA) 108 (90 Base) MCG/ACT inhaler TAKE 2 PUFFS BY MOUTH EVERY 6 HOURS AS NEEDED FOR WHEEZE OR SHORTNESS OF BREATH 8.5 g 0   allopurinol  (ZYLOPRIM ) 300 MG tablet TAKE 1 TABLET BY MOUTH EVERY DAY 90 tablet 1   ALPRAZolam  (XANAX ) 0.5  MG tablet Take 1 tablet (0.5 mg total) by mouth at bedtime as needed for anxiety. 90 tablet 0   amoxicillin -clavulanate (AUGMENTIN ) 875-125 MG tablet Take 1 tablet by mouth 2 (two) times daily. (Patient not taking: Reported on 05/18/2024) 20 tablet 0   aspirin  EC 81 MG tablet Take 81 mg by mouth daily.     CALCIUM -MAGNESIUM -ZINC PO Take 1 tablet by mouth daily.     docusate sodium  (COLACE) 100 MG capsule Take 200 mg by mouth at bedtime.     ezetimibe  (ZETIA ) 10 MG tablet Take 1 tablet (10 mg total) by mouth daily. 90 tablet 0   Omega-3 Fatty Acids  1200 MG CAPS Take 1,200 mg by mouth daily.     omeprazole  (PRILOSEC) 40 MG capsule TAKE 1 CAPSULE (40 MG TOTAL) BY MOUTH DAILY. 90 capsule 2   pravastatin  (PRAVACHOL ) 20 MG tablet Take 1 tablet (20 mg total) by mouth daily. 90 tablet 0   Probiotic Product (PROBIOTIC ADVANCED PO) Take by mouth daily.     sertraline  (ZOLOFT ) 50 MG tablet TAKE 1 TABLET BY MOUTH EVERY DAY 90 tablet 0   spironolactone  (ALDACTONE ) 25 MG tablet TAKE 1/2 TABLET BY MOUTH DAILY 45 tablet 1   Fluticasone -Umeclidin-Vilant (TRELEGY ELLIPTA ) 100-62.5-25 MCG/ACT AEPB Inhale 1 puff into the lungs daily. 60 each 6   metoprolol  succinate (TOPROL -XL) 50 MG 24 hr tablet TAKE ONE TABLET WITH OR IMMEDIATELY FOLLOWING A MEAL. 15 tablet 0   No facility-administered  medications prior to visit.   Review of Systems  Constitutional:  Negative for chills, fever, malaise/fatigue and weight loss.  HENT:  Negative for congestion, sinus pain and sore throat.   Eyes: Negative.   Respiratory:  Positive for shortness of breath (with exertion). Negative for cough, hemoptysis, sputum production and wheezing.   Cardiovascular:  Negative for chest pain, palpitations, orthopnea, claudication, leg swelling and PND.  Gastrointestinal:  Negative for abdominal pain, heartburn, nausea and vomiting.  Genitourinary: Negative.   Musculoskeletal: Negative.   Skin:  Negative for rash.  Neurological:  Negative for dizziness, weakness and headaches.  Endo/Heme/Allergies: Negative.   Psychiatric/Behavioral: Negative.      Objective:   Vitals:   05/18/24 1112  BP: 107/67  Pulse: 62  SpO2: 95%  Weight: 150 lb 12.8 oz (68.4 kg)  Height: 5\' 8"  (1.727 m)    Physical Exam Constitutional:      General: She is not in acute distress.    Appearance: Normal appearance. She is normal weight. She is not ill-appearing.  HENT:     Head: Normocephalic and atraumatic.  Cardiovascular:     Rate and Rhythm: Normal rate and regular rhythm.     Pulses: Normal pulses.     Heart sounds: Normal heart sounds. No murmur heard. Pulmonary:     Effort: Pulmonary effort is normal. No respiratory distress.     Breath sounds: Rales (bibasilar) present. No wheezing or rhonchi.  Musculoskeletal:     Right lower leg: No edema.     Left lower leg: No edema.  Neurological:     Mental Status: She is alert.    CBC    Component Value Date/Time   WBC 8.5 10/10/2020 0830   WBC 7.8 02/17/2019 1443   RBC 4.76 10/10/2020 0830   RBC 4.53 11/24/2019 0000   HGB 14.2 10/10/2020 0830   HCT 43.0 10/10/2020 0830   PLT 324 10/10/2020 0830   MCV 90 10/10/2020 0830   MCH 29.8 10/10/2020 0830   MCH 30.2 12/10/2017 0355  MCHC 33.0 10/10/2020 0830   MCHC 33.1 02/17/2019 1443   RDW 14.5 10/10/2020  0830   LYMPHSABS 1.7 02/17/2019 1443   MONOABS 0.9 02/17/2019 1443   EOSABS 0.3 02/17/2019 1443   BASOSABS 0.1 02/17/2019 1443      Latest Ref Rng & Units 04/02/2024    8:09 AM 05/01/2022   10:07 AM 06/29/2021    8:33 AM  BMP  Glucose 70 - 99 mg/dL 960  454  99   BUN 6 - 23 mg/dL 17  12  12    Creatinine 0.40 - 1.20 mg/dL 0.98  1.19  1.47   BUN/Creat Ratio 12 - 28  14  13    Sodium 135 - 145 mEq/L 140  139  140   Potassium 3.5 - 5.1 mEq/L 3.9  4.6  4.3   Chloride 96 - 112 mEq/L 101  100  102   CO2 19 - 32 mEq/L 30  26  24    Calcium  8.4 - 10.5 mg/dL 9.5  9.1  9.0    Chest imaging: HRCT Chest 03/31/24 1. Very mild basilar pulmonary fibrosis, unchanged from 04/18/2023. Findings are indeterminate for UIP per consensus guidelines: Diagnosis of Idiopathic Pulmonary Fibrosis: An Official ATS/ERS/JRS/ALAT Clinical Practice Guideline. Am Annie Barton Crit Care Med Vol 198, Iss 5, [phone], Aug 23 2017. 2. Aortic atherosclerosis (ICD10-I70.0). Coronary artery calcification. 3. Enlarged right and left pulmonary arteries, indicative of pulmonary arterial hypertension.  HRCT Chest 04/18/23 1. Spectrum of findings compatible with mild basilar predominant fibrotic interstitial lung disease without frank honeycombing. No appreciable interval progression since 04/08/2022 CT. Minimal progression since baseline 03/15/2021 CT. Mild patchy air trapping in both lungs, unchanged, compatible with small airways disease. Differential includes UIP or fibrotic NSIP. Chronic hypersensitivity pneumonitis is less likely given the basilar predominance. Findings are indeterminate for UIP per consensus guidelines: Diagnosis of Idiopathic Pulmonary Fibrosis: An Official ATS/ERS/JRS/ALAT Clinical Practice Guideline. Am Annie Barton Crit Care Med Vol 198, Iss 5, [phone], Aug 23 2017. 2. One vessel coronary atherosclerosis. 3.  Aortic Atherosclerosis   HRCT Chest 04/08/22 1. Basilar predominant fibrotic interstitial lung  disease. No evidence of progression when compared with March 15, 2021 prior. 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 Am Annie Barton Crit Care Med Vol 198, Iss 5, [phone], Aug 23 2017. 2. Stable solid pulmonary nodules of the right middle lobe, favored to be benign given 1 year stability. 3. Aortic Atherosclerosis  HRCT Chest 03/15/21  Two solid right middle lobe pulmonary nodules, largest 5 mm (series 8/image 84). Noadditional significant pulmonary nodules. No significant air trapping or evidence of tracheobronchomalacia on the expiration sequence. Mild patchy confluent subpleural reticulation and ground-glass opacity in both lungs with associated mild traction bronchiectasis and minimal architectural distortion. There is a basilar predominance to these findings. No frank honeycombing.  CXR 01/03/2021 Heart and mediastinal contours are within normal limits. No focal opacities or effusions. No acute bony abnormality.  CXR 02/01/2013 Findings: Cardiomediastinal silhouette is stable.  No acute  infiltrate or pleural effusion.  No pulmonary edema.  Bony thorax  is unremarkable. Again noted chronic mild interstitial prominence.   PFT:    Latest Ref Rng & Units 06/05/2023    8:01 AM 06/18/2022    3:46 PM 01/25/2021   11:04 AM  PFT Results  FVC-Pre L 2.03  2.01  1.93   FVC-Predicted Pre % 70  72  68   FVC-Post L 1.94  2.13  2.14   FVC-Predicted Post % 67  76  75   Pre FEV1/FVC % % 78  93  95   Post FEV1/FCV % % 77  82  84   FEV1-Pre L 1.58  1.87  1.84   FEV1-Predicted Pre % 73  90  87   FEV1-Post L 1.49  1.74  1.80   DLCO uncorrected ml/min/mmHg 13.57  12.08  14.24   DLCO UNC% % 66  61  72   DLCO corrected ml/min/mmHg 13.57  12.08  14.24   DLCO COR %Predicted % 66  61  72   DLVA Predicted % 88  81  102   01/25/21: Mild restrictive defect and mild diffusion  defect  Echo: 10/30/2020  1. Left ventricular ejection fraction, by estimation, is 50 to 55%. The  left ventricle has low normal function. The left ventricle has no regional  wall motion abnormalities. Left ventricular diastolic parameters are  consistent with Grade I diastolic  dysfunction (impaired relaxation). The average left ventricular global  longitudinal strain is -20.4 %.   2. Right ventricular systolic function is moderately reduced. The right  ventricular size is normal. There is normal pulmonary artery systolic  pressure. The estimated right ventricular systolic pressure is 28.6 mmHg.   3. The mitral valve is normal in structure. Trivial mitral valve  regurgitation. No evidence of mitral stenosis.   4. The aortic valve is tricuspid. Aortic valve regurgitation is mild.  Mild aortic valve sclerosis is present, with no evidence of aortic valve  stenosis.   5. The inferior vena cava is normal in size with greater than 50%  respiratory variability, suggesting right atrial pressure of 3 mmHg.   Home Sleep Study 04/03/21 Mild Obstructive Sleep apnea - 5.3 AHI/hr, nocturnal hypoxia  Assessment & Plan:   ILD (interstitial lung disease) (HCC) - Plan: Pulmonary Function Test, CT CHEST HIGH RESOLUTION  Discussion: Jessica Mcneil is a 83 year old woman, former smoker with nonischemic cardiomyopathy, hypertension, hyperlipidemia and GERD who returns to pulmonary clinic for interstitial lung disease.  Pulmonary fibrosis with stable scarring Pulmonary fibrosis stable on CT. Antifibrotic treatment deferred due to side effects. No progression noted. Monitoring continued without treatment. - Schedule pulmonary function tests to monitor lung function. - Plan for annual follow-up with CT scan next April or May.  Follow up in 1 year  Duaine German, MD [facility] Office: [phone]    Current Outpatient Medications:    acetaminophen  (TYLENOL ) 500 MG tablet,  Take 500 mg by mouth. Patient reports taking 1 to 2 tablets a day if needed., Disp: , Rfl:    albuterol  (VENTOLIN  HFA) 108 (90 Base) MCG/ACT inhaler, TAKE 2 PUFFS BY MOUTH EVERY 6 HOURS AS NEEDED FOR WHEEZE OR SHORTNESS OF BREATH, Disp: 8.5 g, Rfl: 0   allopurinol  (ZYLOPRIM ) 300 MG tablet, TAKE 1 TABLET BY MOUTH EVERY DAY, Disp: 90 tablet, Rfl: 1   ALPRAZolam  (XANAX ) 0.5 MG tablet, Take 1 tablet (0.5 mg total) by mouth at bedtime as needed for anxiety., Disp: 90 tablet, Rfl: 0   amoxicillin -clavulanate (AUGMENTIN ) 875-125 MG tablet, Take 1 tablet by mouth 2 (two) times daily. (Patient not taking: Reported on 05/18/2024), Disp: 20 tablet, Rfl: 0   aspirin  EC 81 MG tablet, Take 81 mg by mouth daily., Disp: , Rfl:    CALCIUM -MAGNESIUM -ZINC PO, Take 1 tablet by mouth daily., Disp: , Rfl:    docusate sodium  (COLACE) 100 MG capsule, Take 200 mg by mouth at  bedtime., Disp: , Rfl:    ezetimibe  (ZETIA ) 10 MG tablet, Take 1 tablet (10 mg total) by mouth daily., Disp: 90 tablet, Rfl: 0   Fluticasone -Umeclidin-Vilant (TRELEGY ELLIPTA ) 100-62.5-25 MCG/ACT AEPB, Inhale 1 puff into the lungs daily., Disp: 60 each, Rfl: 6   metoprolol  succinate (TOPROL -XL) 50 MG 24 hr tablet, TAKE ONE TABLET WITH OR IMMEDIATELY FOLLOWING A MEAL. PT. MUST MAKE AN APPOINTMENT IN ORDER TO RECEIVE FUTURE REFILLS. FIRST ATTEMPT., Disp: 30 tablet, Rfl: 0   Omega-3 Fatty Acids  1200 MG CAPS, Take 1,200 mg by mouth daily., Disp: , Rfl:    omeprazole  (PRILOSEC) 40 MG capsule, TAKE 1 CAPSULE (40 MG TOTAL) BY MOUTH DAILY., Disp: 90 capsule, Rfl: 2   pravastatin  (PRAVACHOL ) 20 MG tablet, Take 1 tablet (20 mg total) by mouth daily., Disp: 90 tablet, Rfl: 0   Probiotic Product (PROBIOTIC ADVANCED PO), Take by mouth daily., Disp: , Rfl:    sertraline  (ZOLOFT ) 50 MG tablet, TAKE 1 TABLET BY MOUTH EVERY DAY, Disp: 90 tablet, Rfl: 0   spironolactone  (ALDACTONE ) 25 MG tablet, TAKE 1/2 TABLET BY MOUTH DAILY, Disp: 45 tablet, Rfl: 1

## 2024-05-21 ENCOUNTER — Other Ambulatory Visit: Payer: Self-pay | Admitting: Nurse Practitioner

## 2024-05-23 ENCOUNTER — Encounter: Payer: Self-pay | Admitting: Pulmonary Disease

## 2024-05-25 ENCOUNTER — Other Ambulatory Visit: Payer: Self-pay | Admitting: Nurse Practitioner

## 2024-05-25 ENCOUNTER — Other Ambulatory Visit: Payer: Self-pay

## 2024-05-25 MED ORDER — FLUTICASONE-UMECLIDIN-VILANT 100-62.5-25 MCG/ACT IN AEPB: 1.0000 | INHALATION_SPRAY | Freq: Every day | RESPIRATORY_TRACT | 6 refills | Status: DC

## 2024-05-26 ENCOUNTER — Encounter: Payer: Self-pay | Admitting: Pulmonary Disease

## 2024-05-31 DIAGNOSIS — H401131 Primary open-angle glaucoma, bilateral, mild stage: Secondary | ICD-10-CM | POA: Diagnosis not present

## 2024-05-31 DIAGNOSIS — H04123 Dry eye syndrome of bilateral lacrimal glands: Secondary | ICD-10-CM | POA: Diagnosis not present

## 2024-06-14 ENCOUNTER — Other Ambulatory Visit: Payer: Self-pay

## 2024-06-14 DIAGNOSIS — I10 Essential (primary) hypertension: Secondary | ICD-10-CM

## 2024-06-14 MED ORDER — SPIRONOLACTONE 25 MG PO TABS: 12.5000 mg | ORAL_TABLET | Freq: Every day | ORAL | 0 refills | Status: DC

## 2024-06-26 ENCOUNTER — Other Ambulatory Visit: Payer: Self-pay | Admitting: Family Medicine

## 2024-06-28 ENCOUNTER — Other Ambulatory Visit: Payer: Self-pay | Admitting: Family Medicine

## 2024-07-01 ENCOUNTER — Encounter

## 2024-07-07 ENCOUNTER — Encounter

## 2024-07-30 ENCOUNTER — Ambulatory Visit: Admitting: Pulmonary Disease

## 2024-07-30 DIAGNOSIS — J849 Interstitial pulmonary disease, unspecified: Secondary | ICD-10-CM | POA: Diagnosis not present

## 2024-07-30 LAB — PULMONARY FUNCTION TEST
DL/VA % pred: 82 %
DL/VA: 3.31 ml/min/mmHg/L
DLCO unc % pred: 53 %
DLCO unc: 10.8 ml/min/mmHg
FEF 25-75 Post: 0.6 L/s
FEF 25-75 Pre: 2.56 L/s
FEF2575-%Change-Post: -76 %
FEF2575-%Pred-Post: 40 %
FEF2575-%Pred-Pre: 171 %
FEV1-%Change-Post: -25 %
FEV1-%Pred-Post: 63 %
FEV1-%Pred-Pre: 84 %
FEV1-Post: 1.35 L
FEV1-Pre: 1.8 L
FEV1FVC-%Change-Post: -18 %
FEV1FVC-%Pred-Pre: 113 %
FEV6-%Change-Post: -6 %
FEV6-%Pred-Post: 71 %
FEV6-%Pred-Pre: 76 %
FEV6-Post: 1.93 L
FEV6-Pre: 2.06 L
FEV6FVC-%Change-Post: 0 %
FEV6FVC-%Pred-Post: 105 %
FEV6FVC-%Pred-Pre: 105 %
FVC-%Change-Post: -8 %
FVC-%Pred-Post: 69 %
FVC-%Pred-Pre: 75 %
FVC-Post: 1.98 L
FVC-Pre: 2.15 L
Post FEV1/FVC ratio: 68 %
Post FEV6/FVC ratio: 100 %
Pre FEV1/FVC ratio: 84 %
Pre FEV6/FVC Ratio: 100 %

## 2024-07-30 NOTE — Progress Notes (Signed)
 Full pft performed today.

## 2024-07-30 NOTE — Patient Instructions (Signed)
 Full pft performed today.

## 2024-08-02 ENCOUNTER — Ambulatory Visit: Payer: Self-pay | Admitting: Pulmonary Disease

## 2024-08-03 ENCOUNTER — Telehealth: Payer: Self-pay | Admitting: Cardiology

## 2024-08-03 DIAGNOSIS — I10 Essential (primary) hypertension: Secondary | ICD-10-CM

## 2024-08-03 MED ORDER — SPIRONOLACTONE 25 MG PO TABS: 12.5000 mg | ORAL_TABLET | Freq: Every day | ORAL | 0 refills | Status: DC

## 2024-08-03 MED ORDER — METOPROLOL SUCCINATE ER 50 MG PO TB24
ORAL_TABLET | ORAL | 0 refills | Status: DC
Start: 2024-08-03 — End: 2024-08-05

## 2024-08-03 NOTE — Telephone Encounter (Signed)
 *  STAT* If patient is at the pharmacy, call can be transferred to refill team.   1. Which medications need to be refilled? (please list name of each medication and dose if known)   metoprolol  succinate (TOPROL -XL) 50 MG 24 hr tablet  spironolactone  (ALDACTONE ) 25 MG tablet   2. Which pharmacy/location (including street and city if local pharmacy) is medication to be sent to?  [id number] [address] [place], [place] - [address]    3. Do they need a 30 day or 90 day supply? 90  Patient has appt on 08/05/24 and she is completely out of meds

## 2024-08-03 NOTE — Telephone Encounter (Signed)
 RX sent in

## 2024-08-04 NOTE — Progress Notes (Signed)
 Office Visit    Patient Name: Jessica Mcneil [facility] Date of Encounter: 08/05/2024  PCP:  Micheal Wolm ORN, MD   [facility]  Cardiologist:  Wilbert Bihari, MD  Advanced Practice Provider:  No care team member to display Electrophysiologist:  None   HPI    Jessica Mcneil is a 83 y.o. female with a past medical history of NICM with normal coronary arteries, chronic combined systolic and diastolic heart failure, hyperlipidemia, restrictive lung disease and bronchiectasis presents today for follow-up appointment.  CHF due to NICM: February 19, 2006 cardiac catheterization revealing normal coronary arteries with an EF of 50%.  EF 35 to 40% by echo.   CPX 03/2006 pVO2: 20.6 (105% predicted), slope of 42.7, pRER 1.09, mild restriction on spirometry Echo 2008 EF 40 to 50% with mild diffuse LV hypokinesis and abnormal left ventricular relaxation, mild AI. Echo 07/2008 EF 55 to 60%, trivial AI Echo 4/12 EF 60-65%, G1 DD, mild MR/AI Echo 06/2014 EF 45-50%, G1 DD, mild MR/AI Echo 08/2015 EF 45-50%, G1 DD, trivial AI Echo 11/2016 EF 45-50%, G1 DD, mild AI Echo 09/2019 EF 45-50%, impaired diastolic filling, mild TR/AI Echo 10/2020 EF 50-55%, G1 DD, mild AI    She was followed by the AHF clinic until 9/16 when she was referred to general cardiology.  Followed by Dr. Maranda and then Dr. Bihari.  She was seen in the office 06/29/2021 by Dr. Bihari at that time LDL was elevated on pravastatin .  Patient was advised to start rosuvastatin.  However, there was no record of the prescription.  She was advised to return for 22-month follow-up.  She was seen by Rosaline Booty, NP 04/2022 and at that time she reported feeling well, however she continued to have dyspnea on exertion.  She gets very winded walking for a long distance.  She felt this was nothing new and was stable.  Sleeps with HOB elevated, but denies recent worsening of orthopnea, no PND or edema.  Denied chest  pain, lower extremity edema, fatigue, palpitations, melena, hematuria, hemoptysis, diaphoresis, weakness, syncope, or presyncope.  Admitted to eating too much sugar and eats more when she is stressed.  History of statin intolerance and does not want to try different statin.  Patient thinks that she might tolerate Vascepa since her sister is taking it and it really helped her.  I saw her March 2024, she tells me that she had a lipid panel that was done last year showing LDL 127 and triglycerides 893. She tells me she is due for labs. She is still having SOB and has had chronic issues with this. She has interstitial lung disease with recent PFTs and labs as well as CT scan performed. All studies reviewed today. She wants to make sure her symptoms are not from her heart. We have ordered an Echo today.  Today, she presented with pulmonary fibrosis and coronary artery disease with palpitations and fatigue.  She experiences palpitations, described as skipped heartbeats, which are more noticeable when tired. These are not daily but can be bothersome. A recent EKG showed two PVCs, and a previous EKG from May 2023 noted a right bundle branch block.  She has coronary artery calcification and imaging has shown aortic atherosclerosis and enlarged pulmonary arteries, which are being monitored for pulmonary artery hypertension. She experiences fatigue and shortness of breath, attributing these symptoms to her pulmonary and cardiac conditions. Oxygen  therapy has not been recommended.  She has cardiomyopathy and undergoes annual echocardiograms.  The last echocardiogram showed a low normal ejection fraction of 50-55% with no significant valvular heart disease. Shortness of breath and fatigue have worsened since last year.  She experienced ankle swelling a few weeks ago, managed by increasing fluid intake. She is on spironolactone  12.5 mg and has not used additional diuretics recently.  Her LDL cholesterol was high in  April. She is on Zetia  and pravastatin , having started Zetia  a few months ago, and is awaiting a follow-up lipid panel to assess the effectiveness of her current regimen.  Palpitations a couple of times a week.   Reports no shortness of breath nor dyspnea on exertion. Reports no chest pain, pressure, or tightness. No orthopnea, PND.   Discussed the use of AI scribe software for clinical note transcription with the patient, who gave verbal consent to proceed.          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   Shortness of breath    Past Surgical History:  Procedure Laterality Date   ANTERIOR AND POSTERIOR REPAIR  2003   bladder   APPENDECTOMY     BELPHAROPTOSIS REPAIR     bilat   BREAST BIOPSY Right    BREAST LUMPECTOMY WITH NEEDLE LOCALIZATION Right 02/12/2013   Procedure: BREAST LUMPECTOMY WITH NEEDLE LOCALIZATION;  Surgeon: Deward GORMAN Curvin DOUGLAS, MD;  Location: [facility];  Service: General;  Laterality: Right;   CHOLECYSTECTOMY     COLONOSCOPY     EYE SURGERY     Glaucoma Implant   TONSILLECTOMY     TOTAL SHOULDER ARTHROPLASTY Right 12/09/2017   Procedure: REVERSE TOTAL SHOULDER ARTHROPLASTY;  Surgeon: Josefina Chew, MD;  Location: [facility] OR;  Service: Orthopedics;  Laterality: Right;   VAGINAL HYSTERECTOMY  1998    Allergies  Allergies  Allergen Reactions   Lipitor [Atorvastatin ]     UNSPECIFIED REACTION    Codeine Itching   Erythromycin Nausea And Vomiting    GI UPSET   Sulfamethoxazole Itching    Over 40 years ago     EKGs/Labs/Other  Studies Reviewed:   The following studies were reviewed today:  Echocardiogram 10/30/2020 FINDINGS   Left Ventricle: Left ventricular ejection fraction, by estimation, is 50  to 55%. The left ventricle has low normal function. The left ventricle has  no regional wall motion abnormalities. The average left ventricular global  longitudinal strain is -20.4  %. The left ventricular internal cavity size was normal in size. There is  no left ventricular hypertrophy. Left ventricular diastolic parameters are  consistent with Grade I diastolic dysfunction (impaired relaxation).   Right Ventricle: The right ventricular size is normal. Right vetricular  wall thickness was not assessed. Right ventricular systolic function is  moderately reduced. There is normal pulmonary artery systolic pressure.  The tricuspid regurgitant velocity is  2.53 m/s, and with an assumed right atrial pressure of 3 mmHg, the  estimated right ventricular systolic pressure is 28.6 mmHg.   Left Atrium: Left atrial size was normal in  size.   Right Atrium: Right atrial size was normal in size.   Pericardium: There is no evidence of pericardial effusion.   Mitral Valve: The mitral valve is normal in structure. Trivial mitral  valve regurgitation. No evidence of mitral valve stenosis.   Tricuspid Valve: The tricuspid valve is normal in structure. Tricuspid  valve regurgitation is trivial.   Aortic Valve: The aortic valve is tricuspid. Aortic valve regurgitation is  mild. Aortic regurgitation PHT measures 505 msec. Mild aortic valve  sclerosis is present, with no evidence of aortic valve stenosis.   Pulmonic Valve: The pulmonic valve was grossly normal. Pulmonic valve  regurgitation is not visualized.   Aorta: The aortic root and ascending aorta are structurally normal, with  no evidence of dilitation.   Venous: The inferior vena cava is normal in size with greater than 50%  respiratory variability, suggesting right  atrial pressure of 3 mmHg.   IAS/Shunts: No atrial level shunt detected by color flow Doppler.       EKG:    Recent Labs: 04/02/2024: ALT 14; BUN 17; Creatinine, Ser 0.87; Potassium 3.9; Sodium 140  Recent Lipid Panel    Component Value Date/Time   CHOL 240 (H) 04/02/2024 0809   CHOL 186 04/21/2023 0817   TRIG 126.0 04/02/2024 0809   HDL 61.30 04/02/2024 0809   HDL 52 04/21/2023 0817   CHOLHDL 4 04/02/2024 0809   VLDL 25.2 04/02/2024 0809   LDLCALC 154 (H) 04/02/2024 0809   LDLCALC 107 (H) 04/21/2023 0817   LDLDIRECT 216.7 06/05/2011 1105   Home Medications   Current Meds  Medication Sig   acetaminophen  (TYLENOL ) 500 MG tablet Take 500 mg by mouth. Patient reports taking 1 to 2 tablets a day if needed.   albuterol  (VENTOLIN  HFA) 108 (90 Base) MCG/ACT inhaler TAKE 2 PUFFS BY MOUTH EVERY 6 HOURS AS NEEDED FOR WHEEZE OR SHORTNESS OF BREATH   allopurinol  (ZYLOPRIM ) 300 MG tablet TAKE 1 TABLET BY MOUTH EVERY DAY   ALPRAZolam  (XANAX ) 0.5 MG tablet Take 1 tablet (0.5 mg total) by mouth at bedtime as needed for anxiety.   aspirin  EC 81 MG tablet Take 81 mg by mouth daily.   CALCIUM -MAGNESIUM -ZINC PO Take 1 tablet by mouth daily.   docusate sodium  (COLACE) 100 MG capsule Take 200 mg by mouth at bedtime.   Fluticasone -Umeclidin-Vilant (TRELEGY ELLIPTA ) 100-62.5-25 MCG/ACT AEPB Inhale 1 puff into the lungs daily.   Omega-3 Fatty Acids  1200 MG CAPS Take 1,200 mg by mouth daily.   omeprazole  (PRILOSEC) 40 MG capsule TAKE 1 CAPSULE (40 MG TOTAL) BY MOUTH DAILY.   Probiotic Product (PROBIOTIC ADVANCED PO) Take by mouth daily.   sertraline  (ZOLOFT ) 50 MG tablet TAKE 1 TABLET BY MOUTH EVERY DAY   [DISCONTINUED] ezetimibe  (ZETIA ) 10 MG tablet TAKE 1 TABLET BY MOUTH EVERY DAY   [DISCONTINUED] metoprolol  succinate (TOPROL -XL) 50 MG 24 hr tablet TAKE ONE TABLET WITH OR IMMEDIATELY FOLLOWING A MEAL.   [DISCONTINUED] pravastatin  (PRAVACHOL ) 20 MG tablet TAKE 1 TABLET BY MOUTH EVERY DAY    [DISCONTINUED] spironolactone  (ALDACTONE ) 25 MG tablet Take 0.5 tablets (12.5 mg total) by mouth daily.     Review of Systems      All other systems reviewed and are otherwise negative except as noted above.  Physical Exam    VS:  BP 120/64   Pulse 81   Ht 5' 8 (1.727 m)   Wt 149 lb 14.4 oz (68 kg)   SpO2 95%   BMI 22.79 kg/m  ,  BMI Body mass index is 22.79 kg/m.  Wt Readings from Last 3 Encounters:  08/05/24 149 lb 14.4 oz (68 kg)  05/18/24 150 lb 12.8 oz (68.4 kg)  04/02/24 148 lb 4.8 oz (67.3 kg)     GEN: Well nourished, well developed, in no acute distress. HEENT: normal. Neck: Supple, no JVD, carotid bruits, or masses. Cardiac: RRR, no murmurs, rubs, or gallops. No clubbing, cyanosis, edema.  Radials/PT 2+ and equal bilaterally.  Respiratory:  Respirations regular and unlabored, clear to auscultation bilaterally. GI: Soft, nontender, nondistended. MS: No deformity or atrophy. Skin: Warm and dry, no rash. Neuro:  Strength and sensation are intact. Psych: Normal affect.  Assessment & Plan    Premature ventricular contractions and right bundle branch block EKG shows PVCs not affecting cardiac function. Right bundle branch block monitored. Echocardiogram indicates low normal ejection fraction (50-55%). - Consider heart monitor if skipped beats increase or become bothersome. - Repeat echocardiogram if fatigue worsens.  Coronary artery disease with aortic atherosclerosis CT scan revealed coronary artery calcification and aortic atherosclerosis. Echocardiogram showed low normal ejection fraction (50-55%) with mild aortic valve regurgitation. PVCs not significantly impacting cardiac function. - Order echocardiogram to evaluate cardiac function and assess coronary disease indicators.  Pulmonary fibrosis and pulmonary hypertension Mild pulmonary fibrosis and pulmonary artery hypertension well-managed. Reports increased fatigue. - Pulmonologist may recommend oxygen  therapy  if condition deteriorates.  Edema, intermittent lower extremity Recent ankle edema resolved with increased fluid intake. Currently on spironolactone  12.5 mg. Potassium levels normal (3.9). - Consider increasing spironolactone  to 25 mg if edema becomes more frequent.  Hyperlipidemia Last labs indicated elevated LDL cholesterol. Currently on Zetia  and pravastatin . - Order lab tests to reassess cholesterol levels.  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 Bowel issues managed with fiber supplements and stool softeners. Pain managed with cushion. Declines surgical intervention.     Disposition: Follow up 1 year with Wilbert Bihari, MD or APP.  Signed, Orren LOISE Fabry, PA-C 08/05/2024, 5:23 PM [facility]

## 2024-08-05 ENCOUNTER — Ambulatory Visit: Attending: Physician Assistant | Admitting: Physician Assistant

## 2024-08-05 ENCOUNTER — Encounter: Payer: Self-pay | Admitting: Physician Assistant

## 2024-08-05 VITALS — BP 120/64 | HR 81 | Ht 68.0 in | Wt 149.9 lb

## 2024-08-05 DIAGNOSIS — I5022 Chronic systolic (congestive) heart failure: Secondary | ICD-10-CM | POA: Diagnosis not present

## 2024-08-05 DIAGNOSIS — I10 Essential (primary) hypertension: Secondary | ICD-10-CM | POA: Diagnosis not present

## 2024-08-05 DIAGNOSIS — J449 Chronic obstructive pulmonary disease, unspecified: Secondary | ICD-10-CM | POA: Diagnosis not present

## 2024-08-05 DIAGNOSIS — I4510 Unspecified right bundle-branch block: Secondary | ICD-10-CM

## 2024-08-05 DIAGNOSIS — E785 Hyperlipidemia, unspecified: Secondary | ICD-10-CM

## 2024-08-05 DIAGNOSIS — R0602 Shortness of breath: Secondary | ICD-10-CM | POA: Diagnosis not present

## 2024-08-05 DIAGNOSIS — R0609 Other forms of dyspnea: Secondary | ICD-10-CM

## 2024-08-05 MED ORDER — METOPROLOL SUCCINATE ER 50 MG PO TB24: ORAL_TABLET | ORAL | 3 refills | Status: AC

## 2024-08-05 MED ORDER — SPIRONOLACTONE 25 MG PO TABS: 12.5000 mg | ORAL_TABLET | Freq: Every day | ORAL | 3 refills | Status: AC

## 2024-08-05 MED ORDER — METOPROLOL SUCCINATE ER 50 MG PO TB24: ORAL_TABLET | ORAL | 3 refills | Status: DC

## 2024-08-05 MED ORDER — EZETIMIBE 10 MG PO TABS
10.0000 mg | ORAL_TABLET | Freq: Every day | ORAL | 3 refills | Status: AC
Start: 2024-08-05 — End: ?

## 2024-08-05 MED ORDER — PRAVASTATIN SODIUM 20 MG PO TABS
20.0000 mg | ORAL_TABLET | Freq: Every day | ORAL | 3 refills | Status: AC
Start: 2024-08-05 — End: ?

## 2024-08-05 NOTE — Patient Instructions (Addendum)
 Medication Instructions:  NONE *If you need a refill on your cardiac medications before your next appointment, please call your pharmacy*  Lab Work: TO COMPLETE IN 2 WKS: LFT, LIPIDS  If you have labs (blood work) drawn today and your tests are completely normal, you will receive your results only by: MyChart Message (if you have MyChart) OR A paper copy in the mail If you have any lab test that is abnormal or we need to change your treatment, we will call you to review the results.  Testing/Procedures: Your physician has requested that you have an echocardiogram. Echocardiography is a painless test that uses sound waves to create images of your heart. It provides your doctor with information about the size and shape of your heart and how well your heart's chambers and valves are working. This procedure takes approximately one hour. There are no restrictions for this procedure. TO BE DONE IN 2 WKS Please do NOT wear cologne, perfume, aftershave, or lotions (deodorant is allowed). Please arrive 15 minutes prior to your appointment time.  Please note: We ask at that you not bring children with you during ultrasound (echo/ vascular) testing. Due to room size and safety concerns, children are not allowed in the ultrasound rooms during exams. Our front office staff cannot provide observation of children in our lobby area while testing is being conducted. An adult accompanying a patient to their appointment will only be allowed in the ultrasound room at the discretion of the ultrasound technician under special circumstances. We apologize for any inconvenience.   Follow-Up: At [facility], you and your health needs are our priority.  As part of our continuing mission to provide you with exceptional heart care, our providers are all part of one team.  This team includes your primary Cardiologist (physician) and Advanced Practice Providers or APPs (Physician Assistants and Nurse Practitioners)  who all work together to provide you with the care you need, when you need it.  Your next appointment:   6 month(s)  Provider:   Wilbert Bihari, MD or Orren Fabry, PA-C

## 2024-08-08 ENCOUNTER — Other Ambulatory Visit: Payer: Self-pay | Admitting: Family Medicine

## 2024-08-09 ENCOUNTER — Other Ambulatory Visit: Payer: Self-pay | Admitting: Family Medicine

## 2024-08-09 DIAGNOSIS — Z1231 Encounter for screening mammogram for malignant neoplasm of breast: Secondary | ICD-10-CM

## 2024-08-12 DIAGNOSIS — E785 Hyperlipidemia, unspecified: Secondary | ICD-10-CM | POA: Diagnosis not present

## 2024-08-12 LAB — LIPID PANEL

## 2024-08-13 ENCOUNTER — Ambulatory Visit: Payer: Self-pay | Admitting: Physician Assistant

## 2024-08-13 LAB — LIPID PANEL
Cholesterol, Total: 182 mg/dL (ref 100–199)
HDL: 66 mg/dL (ref >39)
LDL CALC COMMENT:: 2.8 ratio (ref 0.0–4.4)
LDL Chol Calc (NIH): 99 mg/dL (ref 0–99)
Triglycerides: 95 mg/dL (ref 0–149)
VLDL Cholesterol Cal: 17 mg/dL (ref 5–40)

## 2024-08-13 LAB — HEPATIC FUNCTION PANEL
ALT: 15 IU/L (ref 0–32)
AST: 21 IU/L (ref 0–40)
Albumin: 4.2 g/dL (ref 3.7–4.7)
Alkaline Phosphatase: 98 IU/L (ref 44–121)
Bilirubin Total: 0.4 mg/dL (ref 0.0–1.2)
Bilirubin, Direct: 0.17 mg/dL (ref 0.00–0.40)
Total Protein: 6.7 g/dL (ref 6.0–8.5)

## 2024-08-17 NOTE — Telephone Encounter (Signed)
 Letter of results sent to pt

## 2024-08-19 ENCOUNTER — Ambulatory Visit (HOSPITAL_COMMUNITY)
Admission: RE | Admit: 2024-08-19 | Discharge: 2024-08-19 | Disposition: A | Discharge status: Home / Self Care | Source: Ambulatory Visit | Attending: Physician Assistant | Admitting: Physician Assistant

## 2024-08-19 DIAGNOSIS — R0609 Other forms of dyspnea: Secondary | ICD-10-CM | POA: Diagnosis not present

## 2024-08-19 LAB — ECHOCARDIOGRAM COMPLETE
AR max vel: 1.22 cm2
AV Area VTI: 1.22 cm2
AV Area mean vel: 1.26 cm2
AV Mean grad: 4 mmHg
AV Peak grad: 8.2 mmHg
Ao pk vel: 1.43 m/s
Area-P 1/2: 3.23 cm2
Calc EF: 45 %
S' Lateral: 3.26 cm
Single Plane A2C EF: 37.7 %
Single Plane A4C EF: 53.1 %

## 2024-08-30 DIAGNOSIS — H524 Presbyopia: Secondary | ICD-10-CM | POA: Diagnosis not present

## 2024-08-30 DIAGNOSIS — H401123 Primary open-angle glaucoma, left eye, severe stage: Secondary | ICD-10-CM | POA: Diagnosis not present

## 2024-09-02 ENCOUNTER — Telehealth: Payer: Self-pay | Admitting: *Deleted

## 2024-09-02 NOTE — Telephone Encounter (Signed)
 Copied from CRM #[id number]. Topic: Clinical - Medical Advice >> Sep 02, 2024  8:53 AM Thersia BROCKS wrote: Reason for CRM: Patient took a COVID test last night and was positve, stated she needs to start the paxlovid  prescription .Jessica Mcneil Patient stated she would like it as soon possible

## 2024-09-02 NOTE — Telephone Encounter (Signed)
 Copied from CRM #[id number]. Topic: Clinical - Medical Advice >> Sep 02, 2024  8:53 AM Thersia BROCKS wrote: Reason for CRM: Patient took a COVID test last night and was positve, stated she needs to start the paxlovid  prescription .SABRA Patient stated she would like it as soon possible >> Sep 02, 2024  3:57 PM Robinson H wrote: Patient returned call to office advised patient of message and patient stated it will be too late to take the medication because after the third day it won't help, advised patient office did try to reach out to her twice as well, patient declined appointment and finished call

## 2024-09-02 NOTE — Telephone Encounter (Signed)
 Left detailed message informing patient to call our office to schedule virtual visit so medication can be prescribed.

## 2024-09-02 NOTE — Telephone Encounter (Signed)
 I attempted to contact patient x2 but received no answer on mobile phone

## 2024-09-03 MED ORDER — NIRMATRELVIR/RITONAVIR (PAXLOVID)TABLET: 3.0000 | ORAL_TABLET | Freq: Two times a day (BID) | ORAL | 0 refills | Status: AC

## 2024-09-03 NOTE — Addendum Note (Signed)
 Addended by: METTA KRISTEN CROME on: 09/03/2024 01:45 PM   Modules accepted: Orders

## 2024-09-03 NOTE — Addendum Note (Signed)
 Addended by: METTA KRISTEN CROME on: 09/03/2024 01:47 PM   Modules accepted: Orders

## 2024-09-03 NOTE — Telephone Encounter (Signed)
 I spoke with the patient and she would like for rx to be called in. Rx was sent

## 2024-09-06 ENCOUNTER — Other Ambulatory Visit: Payer: Self-pay | Admitting: Family Medicine

## 2024-09-06 ENCOUNTER — Other Ambulatory Visit: Payer: Self-pay | Admitting: Pulmonary Disease

## 2024-09-06 DIAGNOSIS — K219 Gastro-esophageal reflux disease without esophagitis: Secondary | ICD-10-CM

## 2024-09-13 ENCOUNTER — Other Ambulatory Visit: Payer: Self-pay | Admitting: Pulmonary Disease

## 2024-09-13 DIAGNOSIS — R0609 Other forms of dyspnea: Secondary | ICD-10-CM

## 2024-09-15 ENCOUNTER — Ambulatory Visit
Admission: RE | Admit: 2024-09-15 | Discharge: 2024-09-15 | Disposition: A | Discharge status: Home / Self Care | Source: Ambulatory Visit | Attending: Family Medicine | Admitting: Family Medicine

## 2024-09-15 DIAGNOSIS — Z1231 Encounter for screening mammogram for malignant neoplasm of breast: Secondary | ICD-10-CM | POA: Diagnosis not present

## 2024-09-17 DIAGNOSIS — H401111 Primary open-angle glaucoma, right eye, mild stage: Secondary | ICD-10-CM | POA: Diagnosis not present

## 2024-09-17 DIAGNOSIS — H18212 Corneal edema secondary to contact lens, left eye: Secondary | ICD-10-CM | POA: Diagnosis not present

## 2024-09-17 DIAGNOSIS — Z961 Presence of intraocular lens: Secondary | ICD-10-CM | POA: Diagnosis not present

## 2024-09-17 DIAGNOSIS — H401420 Capsular glaucoma with pseudoexfoliation of lens, left eye, stage unspecified: Secondary | ICD-10-CM | POA: Diagnosis not present

## 2024-11-05 DIAGNOSIS — H18232 Secondary corneal edema, left eye: Secondary | ICD-10-CM | POA: Diagnosis not present

## 2024-12-07 ENCOUNTER — Emergency Department (HOSPITAL_BASED_OUTPATIENT_CLINIC_OR_DEPARTMENT_OTHER): Admitting: Radiology

## 2024-12-07 ENCOUNTER — Other Ambulatory Visit: Payer: Self-pay

## 2024-12-07 ENCOUNTER — Emergency Department (HOSPITAL_BASED_OUTPATIENT_CLINIC_OR_DEPARTMENT_OTHER)
Admission: EM | Admit: 2024-12-07 | Discharge: 2024-12-07 | Disposition: A | Discharge status: Home / Self Care | Attending: Emergency Medicine | Admitting: Emergency Medicine

## 2024-12-07 ENCOUNTER — Emergency Department (HOSPITAL_BASED_OUTPATIENT_CLINIC_OR_DEPARTMENT_OTHER)

## 2024-12-07 DIAGNOSIS — M79604 Pain in right leg: Secondary | ICD-10-CM | POA: Diagnosis present

## 2024-12-07 DIAGNOSIS — S82101A Unspecified fracture of upper end of right tibia, initial encounter for closed fracture: Secondary | ICD-10-CM

## 2024-12-07 DIAGNOSIS — M4802 Spinal stenosis, cervical region: Secondary | ICD-10-CM | POA: Diagnosis not present

## 2024-12-07 DIAGNOSIS — S82221A Displaced transverse fracture of shaft of right tibia, initial encounter for closed fracture: Secondary | ICD-10-CM | POA: Diagnosis not present

## 2024-12-07 DIAGNOSIS — M19041 Primary osteoarthritis, right hand: Secondary | ICD-10-CM | POA: Diagnosis not present

## 2024-12-07 DIAGNOSIS — Y93K1 Activity, walking an animal: Secondary | ICD-10-CM | POA: Insufficient documentation

## 2024-12-07 DIAGNOSIS — W010XXA Fall on same level from slipping, tripping and stumbling without subsequent striking against object, initial encounter: Secondary | ICD-10-CM | POA: Diagnosis not present

## 2024-12-07 DIAGNOSIS — S8991XA Unspecified injury of right lower leg, initial encounter: Secondary | ICD-10-CM | POA: Diagnosis present

## 2024-12-07 DIAGNOSIS — S0990XA Unspecified injury of head, initial encounter: Secondary | ICD-10-CM | POA: Diagnosis present

## 2024-12-07 DIAGNOSIS — Z7982 Long term (current) use of aspirin: Secondary | ICD-10-CM | POA: Diagnosis not present

## 2024-12-07 MED ORDER — ACETAMINOPHEN 500 MG PO TABS: 500.0000 mg | ORAL_TABLET | Freq: Four times a day (QID) | ORAL | 0 refills | Status: AC | PRN

## 2024-12-07 MED ORDER — HYDROCODONE-ACETAMINOPHEN 5-325 MG PO TABS
1.0000 | ORAL_TABLET | Freq: Once | ORAL | Status: AC
  Administered 2024-12-07: 20:00:00 1 via ORAL
  Filled 2024-12-07: qty 1

## 2024-12-07 MED ORDER — HYDROCODONE-ACETAMINOPHEN 5-325 MG PO TABS: 1.0000 | ORAL_TABLET | Freq: Three times a day (TID) | ORAL | 0 refills | Status: AC | PRN

## 2024-12-07 NOTE — ED Provider Notes (Signed)
 [place] [facility] AT [facility] Provider Note   CSN: [id number] Arrival date & time: 12/07/24  1711     Patient presents with: Jessica Mcneil   Jessica Mcneil is a 83 y.o. female.   HPI    83 year old female comes in with chief complaint of mechanical fall.  Patient was walking her dog, when she lost her footing and fell.  She fell on the right side and is having pain over the right side of the face, chin, hand and the leg.  The pain over the leg is excruciating.  Patient has not been able to walk.  She denies loss of consciousness, neck pain, any one-sided weakness, numbness, slurred speech, vision loss or severe headache.  Patient denies any dental pain.  Prior to Admission medications  Medication Sig Start Date End Date Taking? Authorizing Provider  acetaminophen  (TYLENOL ) 500 MG tablet Take 1 tablet (500 mg total) by mouth every 6 (six) hours as needed for moderate pain (pain score 4-6). 12/07/24  Yes Charlyn Sora, MD  HYDROcodone -acetaminophen  (NORCO/VICODIN) 5-325 MG tablet Take 1 tablet by mouth every 8 (eight) hours as needed for up to 5 days for severe pain (pain score 7-10). 12/07/24 12/12/24 Yes Charlyn Sora, MD  albuterol  (VENTOLIN  HFA) 108 (90 Base) MCG/ACT inhaler TAKE 2 PUFFS BY MOUTH EVERY 6 HOURS AS NEEDED FOR WHEEZE OR SHORTNESS OF BREATH 09/13/24   Kara Dorn NOVAK, MD  allopurinol  (ZYLOPRIM ) 300 MG tablet TAKE 1 TABLET BY MOUTH EVERY DAY 09/06/24   Burchette, Wolm ORN, MD  ALPRAZolam  (XANAX ) 0.5 MG tablet Take 1 tablet (0.5 mg total) by mouth at bedtime as needed for anxiety. 06/14/22   Burchette, Wolm ORN, MD  aspirin  EC 81 MG tablet Take 81 mg by mouth daily.    [provider]  CALCIUM -MAGNESIUM -ZINC PO Take 1 tablet by mouth daily.    [provider]  docusate sodium  (COLACE) 100 MG capsule Take 200 mg by mouth at bedtime.    [provider]  ezetimibe  (ZETIA ) 10 MG tablet Take 1 tablet (10 mg total) by mouth  daily. 08/05/24   Lucien Orren SAILOR, PA-C  Fluticasone -Umeclidin-Vilant (TRELEGY ELLIPTA ) 100-62.5-25 MCG/ACT AEPB Inhale 1 puff into the lungs daily. 05/25/24   Kara Dorn NOVAK, MD  metoprolol  succinate (TOPROL -XL) 50 MG 24 hr tablet TAKE ONE TABLET WITH OR IMMEDIATELY FOLLOWING A MEAL. 08/05/24   Lucien Orren SAILOR, PA-C  Omega-3 Fatty Acids  1200 MG CAPS Take 1,200 mg by mouth daily.    [provider]  omeprazole  (PRILOSEC) 40 MG capsule TAKE 1 CAPSULE (40 MG TOTAL) BY MOUTH DAILY. 09/06/24   Kara Dorn NOVAK, MD  pravastatin  (PRAVACHOL ) 20 MG tablet Take 1 tablet (20 mg total) by mouth daily. 08/05/24   Lucien Orren SAILOR, PA-C  Probiotic Product (PROBIOTIC ADVANCED PO) Take by mouth daily.    [provider]  sertraline  (ZOLOFT ) 50 MG tablet TAKE 1 TABLET BY MOUTH EVERY DAY 09/06/24   Burchette, Wolm ORN, MD  spironolactone  (ALDACTONE ) 25 MG tablet Take 0.5 tablets (12.5 mg total) by mouth daily. 08/05/24   Lucien Orren SAILOR, PA-C    Allergies: Lipitor [atorvastatin ], Codeine, Erythromycin, and Sulfamethoxazole    Review of Systems  All other systems reviewed and are negative.   Updated Vital Signs BP 111/69 (BP Location: Right Arm)   Pulse 72   Temp (!) 97.5 F (36.4 C)   Resp 16   SpO2 98%   Physical Exam Vitals and nursing note reviewed.  Constitutional:  Appearance: She is well-developed.  Eyes:     Extraocular Movements: Extraocular movements intact.     Pupils: Pupils are equal, round, and reactive to light.  Neck:     Comments: No midline c-spine tenderness, pt able to turn head to 45 degrees bilaterally without any pain and able to flex neck to the chest and extend without any pain or neurologic symptoms.  Cardiovascular:     Rate and Rhythm: Normal rate.  Pulmonary:     Effort: Pulmonary effort is normal.  Musculoskeletal:        General: Swelling, tenderness and deformity present.     Cervical back: Normal range of motion and neck supple.     Comments:  Right tibia edema, redness, with tenderness to palpation.  Right hand has erythema and edema, but patient able to flex overall DIP joints and make a fist.  Skin:    General: Skin is warm and dry.  Neurological:     Mental Status: She is alert and oriented to person, place, and time.     Sensory: No sensory deficit.     (all labs ordered are listed, but only abnormal results are displayed) Labs Reviewed - No data to display  EKG: None  Radiology: CT Head Wo Contrast Result Date: 12/07/2024 EXAM: CT HEAD, FACIAL BONES AND CERVICAL SPINE WITHOUT CONTRAST 12/07/2024 05:59:00 PM TECHNIQUE: CT of the head, facial bones and cervical spine was performed without the administration of intravenous contrast. Multiplanar reformatted images are provided for review. Automated exposure control, iterative reconstruction, and/or weight based adjustment of the mA/kV was utilized to reduce the radiation dose to as low as reasonably achievable. COMPARISON: None available. CLINICAL HISTORY: Head trauma, minor (Age >= 65y) FINDINGS: CT HEAD BRAIN AND VENTRICLES: Parenchymal volume loss is commensurate with the patient's age. Periventricular white matter changes are present likely reflecting the sequela of small vessel ischemia. No acute intracranial hemorrhage. No mass effect or midline shift. No extra-axial fluid collection. No evidence of acute infarct. No hydrocephalus. SKULL AND SCALP: No acute skull fracture. No scalp hematoma. CT FACIAL BONES FACIAL BONES: No acute facial fracture. No mandibular dislocation. No suspicious bone lesion. 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 No acute traumatic injury. SINUSES AND MASTOIDS: Mastoid air cells and middle ear cavities are clear. No acute abnormality. SOFT TISSUES: Moderate soft tissue swelling within the subcutaneous right malar region. CT CERVICAL SPINE BONES AND ALIGNMENT: No acute fracture or traumatic malalignment. DEGENERATIVE CHANGES: Disc space narrowing  and endplate remodeling is seen throughout the cervical spine including changes of advanced degenerative disc disease. Multilevel uncovertebral arthrosis is present resulting in multilevel moderate-to-severe neural foraminal narrowing, most severe on the left at C3-C4 and on the right at C4-C5. No high-grade canal stenosis. SOFT TISSUES: No prevertebral soft tissue swelling. IMPRESSION: 1. No acute intracranial abnormality. 2. No acute fracture of the facial bones. 3. Moderate soft tissue swelling within the subcutaneous right malar region. 4. No acute fracture or traumatic malalignment of the cervical spine. 5. 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 6. Chronic parenchymal volume loss and periventricular white matter changes consistent with small vessel ischemia. 7. Advanced degenerative changes of the cervical spine with multilevel moderate-to-severe neural foraminal narrowing, greatest on the left at C3-4 and on the right at C4-5, without high-grade canal stenosis. Electronically signed by: Dorethia Molt MD 12/07/2024 06:59 PM EST RP Workstation: [id number]   CT Cervical Spine Wo Contrast Result Date: 12/07/2024 EXAM: CT HEAD, FACIAL BONES AND CERVICAL SPINE WITHOUT CONTRAST  12/07/2024 05:59:00 PM TECHNIQUE: CT of the head, facial bones and cervical spine was performed without the administration of intravenous contrast. Multiplanar reformatted images are provided for review. Automated exposure control, iterative reconstruction, and/or weight based adjustment of the mA/kV was utilized to reduce the radiation dose to as low as reasonably achievable. COMPARISON: None available. CLINICAL HISTORY: Head trauma, minor (Age >= 65y) FINDINGS: CT HEAD BRAIN AND VENTRICLES: Parenchymal volume loss is commensurate with the patient's age. Periventricular white matter changes are present likely reflecting the sequela of small vessel ischemia. No acute intracranial hemorrhage. No mass effect or midline shift. No extra-axial  fluid collection. No evidence of acute infarct. No hydrocephalus. SKULL AND SCALP: No acute skull fracture. No scalp hematoma. CT FACIAL BONES FACIAL BONES: No acute facial fracture. No mandibular dislocation. No suspicious bone lesion. 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 No acute traumatic injury. SINUSES AND MASTOIDS: Mastoid air cells and middle ear cavities are clear. No acute abnormality. SOFT TISSUES: Moderate soft tissue swelling within the subcutaneous right malar region. CT CERVICAL SPINE BONES AND ALIGNMENT: No acute fracture or traumatic malalignment. DEGENERATIVE CHANGES: Disc space narrowing and endplate remodeling is seen throughout the cervical spine including changes of advanced degenerative disc disease. Multilevel uncovertebral arthrosis is present resulting in multilevel moderate-to-severe neural foraminal narrowing, most severe on the left at C3-C4 and on the right at C4-C5. No high-grade canal stenosis. SOFT TISSUES: No prevertebral soft tissue swelling. IMPRESSION: 1. No acute intracranial abnormality. 2. No acute fracture of the facial bones. 3. Moderate soft tissue swelling within the subcutaneous right malar region. 4. No acute fracture or traumatic malalignment of the cervical spine. 5. 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 6. Chronic parenchymal volume loss and periventricular white matter changes consistent with small vessel ischemia. 7. Advanced degenerative changes of the cervical spine with multilevel moderate-to-severe neural foraminal narrowing, greatest on the left at C3-4 and on the right at C4-5, without high-grade canal stenosis. Electronically signed by: Dorethia Molt MD 12/07/2024 06:59 PM EST RP Workstation: [id number]   CT Maxillofacial Wo Contrast Result Date: 12/07/2024 EXAM: CT HEAD, FACIAL BONES AND CERVICAL SPINE WITHOUT CONTRAST 12/07/2024 05:59:00 PM TECHNIQUE: CT of the head, facial bones and cervical spine was performed without the administration of  intravenous contrast. Multiplanar reformatted images are provided for review. Automated exposure control, iterative reconstruction, and/or weight based adjustment of the mA/kV was utilized to reduce the radiation dose to as low as reasonably achievable. COMPARISON: None available. CLINICAL HISTORY: Head trauma, minor (Age >= 65y) FINDINGS: CT HEAD BRAIN AND VENTRICLES: Parenchymal volume loss is commensurate with the patient's age. Periventricular white matter changes are present likely reflecting the sequela of small vessel ischemia. No acute intracranial hemorrhage. No mass effect or midline shift. No extra-axial fluid collection. No evidence of acute infarct. No hydrocephalus. SKULL AND SCALP: No acute skull fracture. No scalp hematoma. CT FACIAL BONES FACIAL BONES: No acute facial fracture. No mandibular dislocation. No suspicious bone lesion. 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 No acute traumatic injury. SINUSES AND MASTOIDS: Mastoid air cells and middle ear cavities are clear. No acute abnormality. SOFT TISSUES: Moderate soft tissue swelling within the subcutaneous right malar region. CT CERVICAL SPINE BONES AND ALIGNMENT: No acute fracture or traumatic malalignment. DEGENERATIVE CHANGES: Disc space narrowing and endplate remodeling is seen throughout the cervical spine including changes of advanced degenerative disc disease. Multilevel uncovertebral arthrosis is present resulting in multilevel moderate-to-severe neural foraminal narrowing, most severe on the left at C3-C4 and  on the right at C4-C5. No high-grade canal stenosis. SOFT TISSUES: No prevertebral soft tissue swelling. IMPRESSION: 1. No acute intracranial abnormality. 2. No acute fracture of the facial bones. 3. Moderate soft tissue swelling within the subcutaneous right malar region. 4. No acute fracture or traumatic malalignment of the cervical spine. 5. 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 6. Chronic parenchymal volume loss and  periventricular white matter changes consistent with small vessel ischemia. 7. Advanced degenerative changes of the cervical spine with multilevel moderate-to-severe neural foraminal narrowing, greatest on the left at C3-4 and on the right at C4-5, without high-grade canal stenosis. Electronically signed by: Dorethia Molt MD 12/07/2024 06:59 PM EST RP Workstation: [id number]   DG Tibia/Fibula Right Result Date: 12/07/2024 CLINICAL DATA:  Fall swelling below knee EXAM: RIGHT TIBIA AND FIBULA - 2 VIEW COMPARISON:  None Available. FINDINGS: Osteopenia.Transverse, minimally displaced fracture through the proximal metaphysis of the tibia. Tricompartmental osteoarthritis of the knee. Soft tissue swelling about the proximal calf. IMPRESSION: Transverse, minimally displaced fracture through the metaphysis of the proximal tibia. Electronically Signed   By: Rogelia Myers M.D.   On: 12/07/2024 18:02   DG Hand Complete Right Result Date: 12/07/2024 CLINICAL DATA:  Fall swelling below knee EXAM: RIGHT HAND - COMPLETE 3+ VIEW COMPARISON:  05/02/2017 FINDINGS: Osteopenia.No acute fracture or dislocation. Moderate to severe joint space loss of the DIP joints of the second through fifth digits. Moderate joint space loss of the third metacarpophalangeal joint. Severe joint space loss of the first proximal interphalangeal joint. Moderate joint space loss of the first carpometacarpal and triscaphe joints. Soft tissues are unremarkable. No radiopaque foreign body. IMPRESSION: 1. Diffuse osteopenia. No acute fracture or dislocation. 2. 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 Electronically Signed   By: Rogelia Myers M.D.   On: 12/07/2024 17:58     Procedures   Medications Ordered in the ED  HYDROcodone -acetaminophen  (NORCO/VICODIN) 5-325 MG per tablet 1 tablet (1 tablet Oral Given 12/07/24 1931)                                    Medical Decision Making Amount  and/or Complexity of Data Reviewed Radiology: ordered.  Risk OTC drugs. Prescription drug management.   83 year old patient comes in after sustaining what appears to be a mechanical fall. Pertinent past medical includes -no anticoagulation use.  No red flags for elevated ICP or any acute neurologic symptoms.  Patient denies any chest pain, shortness of breath. Collateral history provided by patient's sister, was also at the bedside.  Based on my history and exam, differential diagnosis includes: - Traumatic brain injury including intracranial hemorrhage -Facial fractures - Long bone fractures -including tibia and tibial plateau - Contusions - Soft tissue injury - Concussion  Based on the initial assessment, the following workup was initiated CT scan of the head, face, lower extremity x-rays and hand x-ray were ordered.  I have independently interpreted the following imaging from the perspective of acute trauma: CT scan of the brain, reveals no evidence of fracture and x-ray of the tibia reveals clear evidence of proximal tibia fracture.  The location of the fracture is quite close to the tibial plateau region.  Patient has swelling.  There is no evidence of compartment syndrome.   I consulted orthopedic surgery and spoke with Dr. Beuford.  He recommends knee immobilizer and follow-up in 1 week,  with what appears to be metaphyseal fracture of the tibia, just distal to the plateau.  Patient is comfortable going home with the knee immobilizer.  She has a rollator walker at home and is comfortable using it.  She is aware that weightbearing is not allowed, except for balance, and she feels that she will be able to accomplish that.  Pain medications prescribed.  Dangers of pain medication also discussed.   Final diagnoses:  Closed fracture of proximal end of right tibia, unspecified fracture morphology, initial encounter    ED Discharge Orders          Ordered     HYDROcodone -acetaminophen  (NORCO/VICODIN) 5-325 MG tablet  Every 8 hours PRN        12/07/24 2008    acetaminophen  (TYLENOL ) 500 MG tablet  Every 6 hours PRN        12/07/24 2008               Charlyn Sora, MD 12/07/24 2014

## 2024-12-07 NOTE — Discharge Instructions (Signed)
 The x-ray confirms that you have fracture of your shin bone.  As discussed, no weightbearing, keep the knee immobilizer on and elevate your leg.  Apply ice 3-4 times a day for 15 to 20 minutes.  Call the orthopedic doctor at the number provided to set up an appointment in 1 week.  We are prescribing you Tylenol  finder milligrams that can be taken every 6 hours.  For breakthrough pain, you can take hydrocodone , but be aware that the side effects can make you drowsy and increase the risk of another fall.  Do everything possible to prevent falls if you are taking that medicine.

## 2024-12-07 NOTE — ED Triage Notes (Addendum)
 Walking dog, tripped on leash, fell on asphalt. Struck right side face, lip, right hand, below right knee. Teeth and tongue appear intact. Nauseated. Occurred 30 min PTA. No LOC. ASA only.

## 2025-01-08 ENCOUNTER — Other Ambulatory Visit: Payer: Self-pay | Admitting: Pulmonary Disease

## 2025-02-08 ENCOUNTER — Ambulatory Visit: Admitting: Family Medicine
